# Patient Record
Sex: Female | Born: 1970 | Race: Black or African American | Hispanic: No | Marital: Single | Smoking: Former smoker
Health system: Southern US, Community
[De-identification: ages and names within clinical notes are randomized; demographics above are authoritative.]

## PROBLEM LIST (undated history)

## (undated) DIAGNOSIS — F419 Anxiety disorder, unspecified: Secondary | ICD-10-CM

## (undated) DIAGNOSIS — F319 Bipolar disorder, unspecified: Secondary | ICD-10-CM

## (undated) DIAGNOSIS — F32A Depression, unspecified: Secondary | ICD-10-CM

## (undated) DIAGNOSIS — E119 Type 2 diabetes mellitus without complications: Secondary | ICD-10-CM

## (undated) DIAGNOSIS — I10 Essential (primary) hypertension: Secondary | ICD-10-CM

## (undated) DIAGNOSIS — F329 Major depressive disorder, single episode, unspecified: Secondary | ICD-10-CM

## (undated) DIAGNOSIS — H409 Unspecified glaucoma: Secondary | ICD-10-CM

## (undated) HISTORY — DX: Essential (primary) hypertension: I10

## (undated) HISTORY — DX: Type 2 diabetes mellitus without complications: E11.9

## (undated) HISTORY — DX: Bipolar disorder, unspecified: F31.9

## (undated) HISTORY — DX: Unspecified glaucoma: H40.9

## (undated) HISTORY — DX: Depression, unspecified: F32.A

## (undated) HISTORY — DX: Major depressive disorder, single episode, unspecified: F32.9

## (undated) HISTORY — PX: OTHER SURGICAL HISTORY: SHX169

## (undated) HISTORY — PX: TUBAL LIGATION: SHX77

## (undated) HISTORY — DX: Anxiety disorder, unspecified: F41.9

---

## 1999-11-15 ENCOUNTER — Encounter: Payer: Self-pay | Admitting: Family Medicine

## 1999-11-15 ENCOUNTER — Emergency Department (HOSPITAL_COMMUNITY): Admission: EM | Admit: 1999-11-15 | Discharge: 1999-11-15 | Payer: Self-pay | Admitting: Emergency Medicine

## 2003-07-22 ENCOUNTER — Encounter: Admission: RE | Admit: 2003-07-22 | Discharge: 2003-07-22 | Payer: Self-pay | Admitting: Family Medicine

## 2003-07-22 ENCOUNTER — Other Ambulatory Visit: Admission: RE | Admit: 2003-07-22 | Discharge: 2003-07-22 | Payer: Self-pay | Admitting: Family Medicine

## 2003-07-24 ENCOUNTER — Ambulatory Visit (HOSPITAL_COMMUNITY): Admission: RE | Admit: 2003-07-24 | Discharge: 2003-07-24 | Payer: Self-pay | Admitting: Family Medicine

## 2003-08-15 ENCOUNTER — Encounter: Admission: RE | Admit: 2003-08-15 | Discharge: 2003-08-15 | Payer: Self-pay | Admitting: Family Medicine

## 2003-09-30 ENCOUNTER — Encounter: Admission: RE | Admit: 2003-09-30 | Discharge: 2003-09-30 | Payer: Self-pay | Admitting: Family Medicine

## 2003-10-21 ENCOUNTER — Ambulatory Visit (HOSPITAL_COMMUNITY): Admission: RE | Admit: 2003-10-21 | Discharge: 2003-10-21 | Payer: Self-pay | Admitting: Family Medicine

## 2003-10-22 ENCOUNTER — Encounter: Admission: RE | Admit: 2003-10-22 | Discharge: 2003-10-22 | Payer: Self-pay | Admitting: Family Medicine

## 2003-11-21 ENCOUNTER — Encounter: Admission: RE | Admit: 2003-11-21 | Discharge: 2003-11-21 | Payer: Self-pay | Admitting: Sports Medicine

## 2003-12-02 ENCOUNTER — Encounter: Admission: RE | Admit: 2003-12-02 | Discharge: 2003-12-02 | Payer: Self-pay | Admitting: Family Medicine

## 2003-12-25 ENCOUNTER — Ambulatory Visit: Payer: Self-pay | Admitting: Family Medicine

## 2004-01-10 ENCOUNTER — Ambulatory Visit: Payer: Self-pay | Admitting: Family Medicine

## 2004-02-06 ENCOUNTER — Ambulatory Visit: Payer: Self-pay | Admitting: Family Medicine

## 2004-02-19 ENCOUNTER — Ambulatory Visit: Payer: Self-pay | Admitting: Family Medicine

## 2004-02-25 ENCOUNTER — Ambulatory Visit: Payer: Self-pay

## 2004-03-03 ENCOUNTER — Ambulatory Visit: Payer: Self-pay | Admitting: Family Medicine

## 2004-03-03 ENCOUNTER — Inpatient Hospital Stay (HOSPITAL_COMMUNITY): Admission: AD | Admit: 2004-03-03 | Discharge: 2004-03-03 | Payer: Self-pay | Admitting: *Deleted

## 2004-03-06 ENCOUNTER — Inpatient Hospital Stay (HOSPITAL_COMMUNITY): Admission: AD | Admit: 2004-03-06 | Discharge: 2004-03-06 | Payer: Self-pay | Admitting: *Deleted

## 2004-03-08 ENCOUNTER — Ambulatory Visit: Payer: Self-pay | Admitting: *Deleted

## 2004-03-08 ENCOUNTER — Inpatient Hospital Stay (HOSPITAL_COMMUNITY): Admission: AD | Admit: 2004-03-08 | Discharge: 2004-03-12 | Payer: Self-pay | Admitting: Family Medicine

## 2004-03-11 ENCOUNTER — Ambulatory Visit: Payer: Self-pay | Admitting: Obstetrics and Gynecology

## 2004-03-11 ENCOUNTER — Encounter (INDEPENDENT_AMBULATORY_CARE_PROVIDER_SITE_OTHER): Payer: Self-pay | Admitting: Specialist

## 2004-04-03 ENCOUNTER — Ambulatory Visit: Payer: Self-pay | Admitting: Sports Medicine

## 2004-05-12 ENCOUNTER — Ambulatory Visit: Payer: Self-pay | Admitting: Family Medicine

## 2004-10-10 ENCOUNTER — Encounter (INDEPENDENT_AMBULATORY_CARE_PROVIDER_SITE_OTHER): Payer: Self-pay | Admitting: *Deleted

## 2004-10-10 LAB — CONVERTED CEMR LAB

## 2004-10-15 ENCOUNTER — Encounter (INDEPENDENT_AMBULATORY_CARE_PROVIDER_SITE_OTHER): Payer: Self-pay | Admitting: *Deleted

## 2004-10-15 ENCOUNTER — Ambulatory Visit: Payer: Self-pay | Admitting: Family Medicine

## 2004-10-29 ENCOUNTER — Ambulatory Visit: Payer: Self-pay | Admitting: Family Medicine

## 2005-07-31 ENCOUNTER — Ambulatory Visit (HOSPITAL_COMMUNITY): Admission: RE | Admit: 2005-07-31 | Discharge: 2005-07-31 | Payer: Self-pay | Admitting: Chiropractic Medicine

## 2005-08-27 ENCOUNTER — Encounter (INDEPENDENT_AMBULATORY_CARE_PROVIDER_SITE_OTHER): Payer: Self-pay | Admitting: Specialist

## 2005-08-27 ENCOUNTER — Ambulatory Visit: Payer: Self-pay | Admitting: Family Medicine

## 2005-10-26 ENCOUNTER — Ambulatory Visit: Payer: Self-pay | Admitting: Sports Medicine

## 2006-06-09 DIAGNOSIS — F17200 Nicotine dependence, unspecified, uncomplicated: Secondary | ICD-10-CM | POA: Insufficient documentation

## 2006-06-09 DIAGNOSIS — E669 Obesity, unspecified: Secondary | ICD-10-CM | POA: Insufficient documentation

## 2006-06-09 DIAGNOSIS — M1990 Unspecified osteoarthritis, unspecified site: Secondary | ICD-10-CM

## 2006-06-10 ENCOUNTER — Encounter (INDEPENDENT_AMBULATORY_CARE_PROVIDER_SITE_OTHER): Payer: Self-pay | Admitting: *Deleted

## 2007-04-04 ENCOUNTER — Ambulatory Visit: Payer: Self-pay | Admitting: Family Medicine

## 2007-04-04 ENCOUNTER — Encounter: Payer: Self-pay | Admitting: Family Medicine

## 2007-04-04 DIAGNOSIS — I10 Essential (primary) hypertension: Secondary | ICD-10-CM | POA: Insufficient documentation

## 2007-04-04 DIAGNOSIS — N898 Other specified noninflammatory disorders of vagina: Secondary | ICD-10-CM | POA: Insufficient documentation

## 2007-04-04 LAB — CONVERTED CEMR LAB: GC Probe Amp, Genital: NEGATIVE

## 2007-04-18 ENCOUNTER — Encounter (INDEPENDENT_AMBULATORY_CARE_PROVIDER_SITE_OTHER): Payer: Self-pay | Admitting: *Deleted

## 2007-05-23 ENCOUNTER — Telehealth: Payer: Self-pay | Admitting: Family Medicine

## 2007-08-01 ENCOUNTER — Emergency Department (HOSPITAL_COMMUNITY): Admission: EM | Admit: 2007-08-01 | Discharge: 2007-08-01 | Payer: Self-pay | Admitting: Family Medicine

## 2009-01-17 ENCOUNTER — Emergency Department (HOSPITAL_COMMUNITY): Admission: EM | Admit: 2009-01-17 | Discharge: 2009-01-17 | Payer: Self-pay | Admitting: Family Medicine

## 2009-03-24 ENCOUNTER — Emergency Department (HOSPITAL_COMMUNITY): Admission: EM | Admit: 2009-03-24 | Discharge: 2009-03-24 | Payer: Self-pay | Admitting: Family Medicine

## 2009-07-03 ENCOUNTER — Ambulatory Visit: Payer: Self-pay | Admitting: Family Medicine

## 2009-07-03 ENCOUNTER — Telehealth: Payer: Self-pay | Admitting: Family Medicine

## 2009-07-03 DIAGNOSIS — F411 Generalized anxiety disorder: Secondary | ICD-10-CM | POA: Insufficient documentation

## 2009-07-04 ENCOUNTER — Telehealth: Payer: Self-pay | Admitting: Family Medicine

## 2009-09-23 ENCOUNTER — Ambulatory Visit: Payer: Self-pay | Admitting: Family Medicine

## 2009-09-23 ENCOUNTER — Encounter: Payer: Self-pay | Admitting: Family Medicine

## 2009-09-25 ENCOUNTER — Encounter: Payer: Self-pay | Admitting: *Deleted

## 2009-11-25 ENCOUNTER — Telehealth (INDEPENDENT_AMBULATORY_CARE_PROVIDER_SITE_OTHER): Payer: Self-pay | Admitting: *Deleted

## 2009-11-25 ENCOUNTER — Ambulatory Visit: Payer: Self-pay | Admitting: Family Medicine

## 2009-12-26 ENCOUNTER — Encounter: Payer: Self-pay | Admitting: *Deleted

## 2010-04-16 ENCOUNTER — Telehealth: Payer: Self-pay | Admitting: Family Medicine

## 2010-05-12 NOTE — Progress Notes (Signed)
Summary: Ref Req  Phone Note Call from Patient Call back at [phone]   Caller: Patient Summary of Call: Was to be referred to an Ear,Nose & Throat also her daughter Joeseph Amor 03/10/04 was to be schedule for an appointment.  Would like to have appointments at same time if possible. Initial call taken by: Clydell Hakim,  July 04, 2009 9:20 AM  Follow-up for Phone Call        it is noted that Cleon Dew has a referral in but I do not see referral for Samiksha. will forward to MD to please advise. Follow-up by: Theresia Lo RN,  July 04, 2009 12:02 PM  Additional Follow-up for Phone Call Additional follow up Details #1::        Referred daughter to ENT for enlarged tonsils. Advised patient that I would have to see her at her physical to refer her for any issues. Patient agreeable with this. Will call to schedule appointment for two weeks from today  Additional Follow-up by: Bobby Rumpf  MD,  July 07, 2009 2:28 PM

## 2010-05-12 NOTE — Letter (Signed)
Summary: Probation Letter  [facility]  [address]   [place], [place] [address]   Phone: [phone]  Fax: [phone]    09/25/2009  Roselyn Meier [address] [place], [place]  [address]  Dear Ms. [name],  With the goal of better serving all our patients the [facility] is following each patient's missed appointments.  You have missed at least 3 appointments with our practice.If you cannot keep your appointment, we expect you to call at least 24 hours before your appointment time.  Missing appointments prevents other patients from seeing [place] and makes it difficult to provide you with the best possible medical care.      1.   If you miss one more appointment, we will only give you limited medical services. This means we will not call in medication refills, complete a form, or make a referral for you except when you are here for a scheduled office visit.    2.   If you miss 2 or more appointments in the next year, we will dismiss you from our practice.    Our office staff can be reached at [phone] Monday through Friday from 8:30 a.m.-5:00 p.m. and will be glad to schedule your appointment as necessary.    Thank you.   [facility]

## 2010-05-12 NOTE — Progress Notes (Signed)
Summary: Rx Prob  Phone Note Call from Patient Call back at [phone]   Caller: Patient Summary of Call: Pt has been getting headaches, shortness of breath from the bp meds she is taking along with being dizzy.  Has helped some with the swelling.  She stopped taking them yesterday. Initial call taken by: Clydell Hakim,  November 25, 2009 12:03 PM  Follow-up for Phone Call        spoke with patient and advised that we can't assume that the symptoms she is experiencing are coming from the medciation HCTZ. BP  was very elevated at last office and she should not stop taking medciation without doctor advising about this. suggested she come in today due to symptoms she has experienced and let MD decide what is best for her. she agrees and appointment is scheduled for work in. Follow-up by: Theresia Lo RN,  November 25, 2009 12:37 PM

## 2010-05-12 NOTE — Assessment & Plan Note (Signed)
Summary: worsening anxiety/[place]/Aaric Dolph   Vital Signs:  Patient profile:   40 year old female Weight:      192.898.1 pounds Pulse rate:   81 / minute BP sitting:   165 / 118  (right arm)  Vitals Entered By: Starleen Blue RN (September 23, 2009 2:59 PM) CC: walkin anxiety Is Patient Diabetic? No Pain Assessment Patient in pain? no      Repeat BP at 3:37,  156/110  Starleen Blue RN  September 23, 2009 3:38 PM   Primary Care Provider:  Bobby Rumpf  MD  CC:  walkin anxiety.  History of Present Illness: 1) Anxiety: Here today reporting that her anxiety has worsened. Last seen on 07/03/09 for anxiety/depressive symptoms x several months; was supposed to follow up in two weeks, however did not keep appointment twice. Was given prescription for Sertraline which she did not fill, refill of Xanax which she did fill (but was only given enough as a bridge, thus she ran out). Has had continued daily and tearfulness - had to leave work due to depressive symptoms since 08/10/09. 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 Reports continued stressors - single parent (kids are 26 and 5), separated from husband in October 2010, work, night classes at [employer] Continued decreased appetite, anhedonia (used to enjoy shoppping), tearfulness, poor sleep (she wakes up almost every night - prays when she cannot sleep), low motivation. Denies SI/HI, but wants "all the stress to just go away". Reports maternal history of depression and "something else" of which she is not sure. Denies manic symptoms. Has been on Paxil in the past which made her feel "slowed down".   - Needs letter stating that she has been out of work due to depressive symptoms.   2) HTN: Has been on HCTZ in the past but stopped taking - she is unsure why. BP elevated today both initially and on recheck. Denies headache, chest pain, vision change. Reports some LE edema which is worse after long day. Does not follow DASH diet.  Habits &  Providers  Alcohol-Tobacco-Diet     Tobacco Status: current     Cigarette Packs/Day: <0.25  Current Medications (verified): 1)  Sertraline Hcl 50 Mg Tabs (Sertraline Hcl) .... One Tab By Mouth At Bedtime 2)  Alprazolam 0.5 Mg Tabs (Alprazolam) .... One Tab By Mouth Three Times A Day As Needed Anxiety 3)  Hydrochlorothiazide 25 Mg Tabs (Hydrochlorothiazide) .... One Tab By Mouth Qday  Allergies (verified): No Known Drug Allergies  Social History: Packs/Day:  <0.25  Review of Systems       as per HPI o/w negative   Physical Exam  General:  overweight, NAD, very tearful and anxious appearing, vitals reviewed - hypertensive  Lungs:  Normal respiratory effort, chest expands symmetrically. Lungs are clear to auscultation, no crackles or wheezes. Heart:  Normal rate and regular rhythm. S1 and S2 normal without gallop, murmur, click, rub or other extra sounds. Extremities:  trace edema  Neurologic:  alert & oriented X3.   Psych:  Oriented X3, memory intact for recent and remote, normally interactive, good eye contact, not suicidal, not homicidal; tearful, and moderately anxious.     Impression & Recommendations:  Problem # 1:  ANXIETY DISORDER (ICD-300.00) Assessment Deteriorated  Will have patient start Sertraline - will monitor closely given described symptoms of psychomotor depression on Paxil. Will refill Xanax as a bridge to therapeutic effect of sertraline. I had given patient referral information  for counselling as per  recommendation of Dr. Pascal Lux (psychologist) but she did not follow up. I will write the letter stating that she has been out of work due to her ongoing psychiatric issues, but advised patient that she needs to follow up as directed. FOLLOW UP IN TWO WEEKS. Call at one week mark to ensure patient doing well.   Her updated medication list for this problem includes:    Sertraline Hcl 50 Mg Tabs (Sertraline hcl) ..... One tab by mouth at bedtime    Alprazolam 0.5 Mg  Tabs (Alprazolam) ..... One tab by mouth three times a day as needed anxiety  Orders: [facility]- Est  Level 4 ([address])  Problem # 2:  HYPERTENSION (ICD-401.9) Assessment: Deteriorated  Will check BMET in two weeks. Restart HCTZ. DASH diet and exercise reviewed - will revisit at next appointment.  Her updated medication list for this problem includes:    Hydrochlorothiazide 25 Mg Tabs (Hydrochlorothiazide) ..... One tab by mouth qday  BP today: 165/118 Prior BP: 150/96 (07/03/2009)  Orders: [facility]- Est  Level 4 ([address])  Complete Medication List: 1)  Sertraline Hcl 50 Mg Tabs (Sertraline hcl) .... One tab by mouth at bedtime 2)  Alprazolam 0.5 Mg Tabs (Alprazolam) .... One tab by mouth three times a day as needed anxiety 3)  Hydrochlorothiazide 25 Mg Tabs (Hydrochlorothiazide) .... One tab by mouth qday  Patient Instructions: 1)  Follow up in two weeks. 2)  Start taking Zoloft as instructed. If you have ANY side effects let me know by calling. I will call you as well to see how you are doing.  3)  Take Xanax as instructed when you are feeling anxious.  4)  Start taking hydrochlorothiazide for blood pressure. 5)  Keep your sodium (salt) intake below 2000 mg daily  Prescriptions: HYDROCHLOROTHIAZIDE 25 MG TABS (HYDROCHLOROTHIAZIDE) one tab by mouth qday  #30 x 1   Entered and Authorized by:   Bobby Rumpf  MD   Signed by:   Bobby Rumpf  MD on 09/23/2009   Method used:   Electronically to        [facility] [phone]* (retail)       [address]       [place], [place]  [address]       Ph: [phone]       Fax: [phone]   RxID:   [id number] SERTRALINE HCL 50 MG TABS (SERTRALINE HCL) one tab by mouth at bedtime  #30 x 0   Entered and Authorized by:   Bobby Rumpf  MD   Signed by:   Bobby Rumpf  MD on 09/23/2009   Method used:   Electronically to        [facility] [phone]* (retail)       [address]       [place], [place]  [address]       Ph: [phone]        Fax: [phone]   RxID:   [id number] ALPRAZOLAM 0.5 MG TABS (ALPRAZOLAM) one tab by mouth three times a day as needed anxiety  #30 x 0   Entered and Authorized by:   Bobby Rumpf  MD   Signed by:   Bobby Rumpf  MD on 09/23/2009   Method used:   Print then Give to Patient   RxID:   [id number]    Prevention & Chronic Care Immunizations   Influenza vaccine: Not documented    Tetanus booster: Not documented    Pneumococcal vaccine: Not documented  Other Screening   Pap smear: Done.  (10/10/2004)    Mammogram: Not documented   Smoking status: current  (09/23/2009)  Lipids   Total Cholesterol: Not documented   LDL: Not documented   LDL Direct: Not documented   HDL: Not documented   Triglycerides: Not documented  Hypertension   Last Blood Pressure: 165 / 118  (09/23/2009)   Serum creatinine: Not documented   Serum potassium Not documented   Basic metabolic panel due: 10/08/2009    Hypertension flowsheet reviewed?: Yes   Progress toward BP goal: Deteriorated  Self-Management Support :   Personal Goals (by the next clinic visit) :      Personal blood pressure goal: 140/90  (09/23/2009)   Patient will work on the following items until the next clinic visit to reach self-care goals:     Medications and monitoring: take my medicines every day, check my blood pressure, bring all of my medications to every visit  (09/23/2009)     Eating: drink diet soda or water instead of juice or soda, eat more vegetables, use fresh or frozen vegetables, eat foods that are low in salt, eat baked foods instead of fried foods, eat fruit for snacks and desserts, limit or avoid alcohol  (09/23/2009)    Hypertension self-management support: Written self-care plan  (09/23/2009)   Hypertension self-care plan printed.

## 2010-05-12 NOTE — Letter (Signed)
Summary: Generic Letter  [facility]  [address]   [place], [place] [address]   Phone: [phone]  Fax: [phone]    09/23/2009  Teresa Chang [address] [place], [place]  [address]  To whom it may concern,  This letter is to certify that the above-mentioned patient has been out of work from the time period of 08/10/09 to 09/19/09 as a result of medical issues. Please feel free to direct any questions regarding this matter to my office at the above address and/or phone number.    Sincerely,   Bobby Rumpf  MD

## 2010-05-12 NOTE — Assessment & Plan Note (Signed)
Summary: headache, dizziness, shortness of breath episodes that she at...   Vital Signs:  Patient profile:   40 year old female Weight:      188.3 pounds Temp:     99.0 degrees F oral Pulse rate:   118 / minute BP sitting:   136 / 91  (left arm) Cuff size:   regular  Vitals Entered By: Garen Grams LPN (November 25, 2009 3:37 PM) CC: problem with BP meds Is Patient Diabetic? No Pain Assessment Patient in pain? no        Primary Care Provider:  Bobby Rumpf  MD  CC:  problem with BP meds.  History of Present Illness: 1. HTN - Pt has been taking HCTZ as prescribed since June - For the past couple of weeks she has been dizzy, light headed, and gets short of breath easily - She is also very thirsty - Her LE swelling has improved - She doesn't check her blood pressure at home regularly  ROS: denies chest pain, headache, vision changes  2. Tobacco use - Has cut down to 5-6 cigs per day - She wants to quit - Was going to try Chantix but was afraid of the side effects - Is going to try and quit on her own  Habits & Providers  Alcohol-Tobacco-Diet     Tobacco Status: current     Tobacco Counseling: to quit use of tobacco products     Cigarette Packs/Day: <0.25  Current Medications (verified): 1)  Lisinopril-Hydrochlorothiazide 10-12.5 Mg Tabs (Lisinopril-Hydrochlorothiazide) .Marland Kitchen.. 1 Tab By Mouth Daily For Blood Pressure  Allergies: No Known Drug Allergies  Past History:  Past Medical History: G2P2- preecclampsia 11/06 HTN Anxiety  Social History: Reviewed history from 04/04/2007 and no changes required. 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  Lives with 85 year old daughter and husband in [place].  Physical Exam  General:  vitals reviewed.  Tachycardic.  no acute distress Head:  normocephalic and atraumatic.   Eyes:  vision grossly intact.  PERRL, EOMI Mouth:  tachy  MM Neck:  no JVD Lungs:  normal respiratory effort, no crackles, and no wheezes.     Heart:  tachycardic, regular rhythm, and no murmur.   Abdomen:  soft, non-tender, and normal bowel sounds.   Extremities:  no LEE Neurologic:  alert & oriented X3, cranial nerves II-XII intact, strength normal in all extremities, sensation intact to light touch, and gait normal.   Skin:  turgor normal.   Psych:  not anxious appearing and not depressed appearing.     Impression & Recommendations:  Problem # 1:  HYPERTENSION (ICD-401.9) Assessment Unchanged  BP is improved but she is likely getting dehydrated from the HCTZ.  She likes that her leg swelling has improved so would like to keep taking a diuretic.  Will decrease the HCTZ and add on ACEI.  Will have her follow up in 2 weeks.  Will need BMET at next office visit. The following medications were removed from the medication list:    Hydrochlorothiazide 25 Mg Tabs (Hydrochlorothiazide) ..... One tab by mouth qday Her updated medication list for this problem includes:    Lisinopril-hydrochlorothiazide 10-12.5 Mg Tabs (Lisinopril-hydrochlorothiazide) .Marland Kitchen... 1 tab by mouth daily for blood pressure  Orders: [facility]- Est  Level 4 ([address])  Problem # 2:  TOBACCO DEPENDENCE (ICD-305.1) Assessment: Unchanged  Advised to quit smoking  Orders: [facility]- Est  Level 4 ([address])  Complete Medication List: 1)  Lisinopril-hydrochlorothiazide 10-12.5 Mg Tabs (Lisinopril-hydrochlorothiazide) .Marland Kitchen.. 1 tab by mouth  daily for blood pressure Prescriptions: LISINOPRIL-HYDROCHLOROTHIAZIDE 10-12.5 MG TABS (LISINOPRIL-HYDROCHLOROTHIAZIDE) 1 tab by mouth daily for blood pressure  #30 x 3   Entered and Authorized by:   Angelena Sole MD   Signed by:   Angelena Sole MD on 11/25/2009   Method used:   Electronically to        [facility] [phone]* (retail)       [address]       [place], [place]  [address]       Ph: [phone]       Fax: [phone]   RxID:   [id number]

## 2010-05-12 NOTE — Miscellaneous (Signed)
Summary: walk in  Clinical Lists Changes she came in & filled out a walk in form while we were at lunch. she did not come back so I called her [phone]. states she is turning the corner onto [address] now. she left work because of her emotional issues and needs a letter? put with Dr. Wallene Huh as he is familiar with her & has had a no show on his schedule.Golden Circle RN  September 23, 2009 2:45 PM   Saw patient in clinic. See note. Bobby Rumpf  MD  September 23, 2009 4:17 PM

## 2010-05-12 NOTE — Progress Notes (Signed)
Summary: Rx Prob  Phone Note Call from Patient Call back at [phone]   Caller: Patient Summary of Call: Patient was to get rx for Zoloft today sent [address] [facility] [address]  Has not been sent yet. Initial call taken by: Clydell Hakim,  July 03, 2009 3:56 PM  Follow-up for Phone Call        advised patient that message will be sent to MD to please send in Zoloft. Follow-up by: Theresia Lo RN,  July 03, 2009 4:40 PM    Prescriptions: SERTRALINE HCL 50 MG TABS (SERTRALINE HCL) one tab by mouth at bedtime  #30 x 0   Entered and Authorized by:   Bobby Rumpf  MD   Signed by:   Bobby Rumpf  MD on 07/04/2009   Method used:   Electronically to        [facility] [phone]* (retail)       [address]       [place], [place]  [address]       Ph: [phone]       Fax: [phone]   RxID:   [id number]

## 2010-05-12 NOTE — Letter (Signed)
Summary: Suspension Letter  [facility]  [address]   [place], [place] [address]   Phone: [phone]  Fax: [phone]    12/26/2009  Teresa Chang [address] [place], [place]  [address]  Dear Ms. [name],  You have missed 4 scheduled appointments with our practice.If you cannot keep your appointment, we expect you to call and cancel at least 24 hours before your appointment time.  As per our policy, we will now only give you limited medical services. means we will not call in a refill for you, or complete a form or make a referral except when you are here for a scheduled office visit.   If you miss 2 more appointments in the next year, we will dismiss you from our practice.  We hope this does not happen.  If you keep your appointments for the next year you will be returned to regular patient status.  We hope these changes will encourage you to keep your appointments so we may provide you the best medical care.   Our office staff can be reached at [phone] Monday through Friday from 8:30 a.m.-5:00 p.m. and will be glad to schedule your appointment as necessary.     Sincerely,   The [facility]

## 2010-05-12 NOTE — Assessment & Plan Note (Signed)
Summary: anxiety ,df   Vital Signs:  Patient profile:   40 year old female Weight:      195.8 pounds Temp:     98.1 degrees F oral Pulse rate:   79 / minute BP sitting:   150 / 96  (left arm) Cuff size:   regular  Vitals Entered By: Garen Grams LPN (July 03, 2009 9:33 AM) CC: anxiety  Is Patient Diabetic? No Pain Assessment Patient in pain? no        Primary Care Provider:  Bobby Rumpf  MD  CC:  anxiety .  History of Present Illness: 1) Anxiety: Reports of worsening anxiety x several months. Has been to [facility] twice within the past several months because of symptoms of severe anxiety and tearfulness - given prescription for Paxil which made her feel "very tired and slowed down" and Xanax 0.5 mg three times a day as needed which helped with her symptoms, but she ran out of this about 3 months ago. 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 Reports decreased appetite, anhedonia (used to enjoy shoppping), tearfulness, poor sleep (she wakes up almost every night - prays when she cannot sleep), low motivation. Denies SI/HI. Reports maternal history of depression and "something else" of which she is not sure. Denies manic symptoms. Wants to just have time to herself to help get rid of stressors.   Habits & Providers  Alcohol-Tobacco-Diet     Tobacco Status: current     Cigarette Packs/Day: 0.25  Current Medications (verified): 1)  Sertraline Hcl 50 Mg Tabs (Sertraline Hcl) .... One Tab By Mouth At Bedtime 2)  Alprazolam 0.5 Mg Tabs (Alprazolam) .... One Tab By Mouth Three Times A Day As Needed Anxiety  Allergies (verified): No Known Drug Allergies  Social History: Packs/Day:  0.25  Physical Exam  General:  overweight, NAD, able to talk about stressors without getting tearful  Lungs:  Normal respiratory  effort, chest expands symmetrically. Lungs are clear to auscultation, no crackles or wheezes. Heart:  Normal rate and regular rhythm. S1 and S2 normal without gallop, murmur, click, rub or other extra sounds. Psych:  Oriented X3, memory intact for recent and remote, normally interactive, good eye contact, not anxious appearing, not depressed appearing, not agitated, not suicidal, and not homicidal.     Impression & Recommendations:  Problem # 1:  ANXIETY DISORDER (ICD-300.00) Assessment New  Will start Sertraline - will monitor closely given described symptoms of psychomotor depression on Paxil. Will refill Xanax as a bridge to therapeutic effect of sertraline. Will refer for counselling as per recommendation of Dr. Pascal Lux (psychologist). Follow up in two weeks for medication effect and for CPE / Pap at that time as well.  Her updated medication list for this problem includes:    Sertraline Hcl 50 Mg Tabs (Sertraline hcl) ..... One tab by mouth at bedtime    Alprazolam 0.5 Mg Tabs (Alprazolam) ..... One tab by mouth three times a day as needed anxiety  Orders: [facility]- Est Level  3 ([address])  Complete Medication List: 1)  Sertraline Hcl 50 Mg Tabs (Sertraline hcl) .... One tab by mouth at bedtime 2)  Alprazolam 0.5 Mg Tabs (Alprazolam) .... One tab by mouth three times a day as needed anxiety  Patient Instructions: 1)  Follow up in two weeks. 2)  We will do  your physical then. 3)  Start taking Zoloft as instructed. If you have ANY side effects let me know by calling. I will call you as well to see how you are doing.  4)  Call the number provided to get set up with counselling.  Prescriptions: ALPRAZOLAM 0.5 MG TABS (ALPRAZOLAM) one tab by mouth three times a day as needed anxiety  #30 x 0   Entered and Authorized by:   Bobby Rumpf  MD   Signed by:   Bobby Rumpf  MD on 07/03/2009   Method used:   Print then Give to Patient   RxID:   [id number]

## 2010-05-14 NOTE — Progress Notes (Signed)
Summary: Rx  Phone Note Refill Request Call back at [phone]   Refills Requested: Medication #1:  LISINOPRIL-HYDROCHLOROTHIAZIDE 10-12.5 MG TABS 1 tab by mouth daily for blood pressure. [facility]  Initial call taken by: Knox Royalty,  April 16, 2010 10:58 AM    Prescriptions: LISINOPRIL-HYDROCHLOROTHIAZIDE 10-12.5 MG TABS (LISINOPRIL-HYDROCHLOROTHIAZIDE) 1 tab by mouth daily for blood pressure  #30 x 6   Entered and Authorized by:   Bobby Rumpf  MD   Signed by:   Bobby Rumpf  MD on 04/16/2010   Method used:   Electronically to        [facility] [phone]* (retail)       [address]       [place], [place]  [address]       Ph: [phone]       Fax: [phone]   RxID:   [id number]

## 2010-08-28 NOTE — Op Note (Signed)
Teresa Chang, POCIUS NO.:  [id number]   MEDICAL RECORD NO.:  [id number]          PATIENT TYPE:  INP   LOCATION:  9116                          FACILITY:  [facility]   PHYSICIAN:  Phil D. Okey Dupre, M.D.     DATE OF BIRTH:  09-13-1970   DATE OF PROCEDURE:  03/11/2004  DATE OF DISCHARGE:                                 OPERATIVE REPORT   PREOPERATIVE DIAGNOSIS:  Voluntary sterilization.   POSTOPERATIVE DIAGNOSIS:  Voluntary sterilization.   OPERATION PERFORMED:  Bilateral tubal ligation and partial bilateral  salpingectomy.   SURGEON:  Javier Glazier. Okey Dupre, M.D.   ANESTHESIA:  General.   POSTOPERATIVE CONDITION:  Satisfactory.   ESTIMATED BLOOD LOSS:  20 mL.   DESCRIPTION OF PROCEDURE:  Under satisfactory general anesthesia patient in  dorsal supine position, the abdomen was prepped and draped in the usual  sterile manner and entered through a semilunar incision situated just below  the umbilicus and extended for a total length of 4 cm.  The abdomen was  entered by layers.  On entering the peritoneal cavity, each fallopian tube  was identified, grasped in the midportion with a Babcock clamp and opening  made in an avascular portion of the broad ligament with a hemostat through  which was drawn a #1 plain catgut suture which was tied around the distal  and proximal ends of the tube to form a loop above the tie of approximately  2 cm in length.  This tie was held with a hemostat while a second tie was  placed just below the aforementioned tie and cut short.  A portion of tube  above the ties was then excised and the free ends of the tube thus exposed  and coagulated with hot cautery.  The area was observed for bleeding and  none was noted and the tubes were replaced into the peritoneal cavity.  A  small bleeder on portion of the peritoneum was probably hit on entry, was  tied off with a 3-0 Vicryl suture.  Area observed for bleeding, none was  noted and the peritoneum was  closed with a 0 Vicryl suture and a pursestring  which was continued on to a fascial closure and a subcuticular closure.  Dry  sterile dressing was applied.  The patient tolerated the procedure well and  was transferred to recovery room in satisfactory condition.      PDR/MEDQ  D:  03/11/2004  T:  03/11/2004  Job:  [id number]

## 2010-12-10 ENCOUNTER — Other Ambulatory Visit: Payer: Self-pay | Admitting: Family Medicine

## 2010-12-10 MED ORDER — LISINOPRIL-HYDROCHLOROTHIAZIDE 10-12.5 MG PO TABS: 1.0000 | ORAL_TABLET | Freq: Every day | ORAL | Status: DC

## 2011-02-04 ENCOUNTER — Telehealth: Payer: Self-pay | Admitting: *Deleted

## 2011-02-04 DIAGNOSIS — I10 Essential (primary) hypertension: Secondary | ICD-10-CM

## 2011-02-04 MED ORDER — LISINOPRIL-HYDROCHLOROTHIAZIDE 10-12.5 MG PO TABS: 1.0000 | ORAL_TABLET | Freq: Every day | ORAL | Status: DC

## 2011-02-04 NOTE — Telephone Encounter (Signed)
Pt advised to make appt and we would give enough refills to last till then.  Appt made for February 19, 2011, 3 weeks worth of meds given.  WILL NOT refill if appt is not kept Alyan Hartline, Maryjo Rochester

## 2011-02-19 ENCOUNTER — Encounter: Payer: Self-pay | Admitting: Family Medicine

## 2011-02-19 ENCOUNTER — Ambulatory Visit (INDEPENDENT_AMBULATORY_CARE_PROVIDER_SITE_OTHER): Payer: BC Managed Care – PPO | Admitting: Family Medicine

## 2011-02-19 DIAGNOSIS — R5383 Other fatigue: Secondary | ICD-10-CM

## 2011-02-19 DIAGNOSIS — Z79899 Other long term (current) drug therapy: Secondary | ICD-10-CM

## 2011-02-19 DIAGNOSIS — R0602 Shortness of breath: Secondary | ICD-10-CM

## 2011-02-19 DIAGNOSIS — R0609 Other forms of dyspnea: Secondary | ICD-10-CM | POA: Insufficient documentation

## 2011-02-19 DIAGNOSIS — M818 Other osteoporosis without current pathological fracture: Secondary | ICD-10-CM

## 2011-02-19 DIAGNOSIS — F17200 Nicotine dependence, unspecified, uncomplicated: Secondary | ICD-10-CM

## 2011-02-19 DIAGNOSIS — I10 Essential (primary) hypertension: Secondary | ICD-10-CM

## 2011-02-19 LAB — BASIC METABOLIC PANEL
BUN: 11 mg/dL (ref 6–23)
CO2: 27 mEq/L (ref 19–32)
Calcium: 9.4 mg/dL (ref 8.4–10.5)
Chloride: 105 mEq/L (ref 96–112)
Creat: 0.93 mg/dL (ref 0.50–1.10)
Glucose, Bld: 89 mg/dL (ref 70–99)

## 2011-02-19 LAB — CBC
Hemoglobin: 12.3 g/dL (ref 12.0–15.0)
Platelets: 227 10*3/uL (ref 150–400)
RBC: 4.71 MIL/uL (ref 3.87–5.11)
WBC: 6.4 10*3/uL (ref 4.0–10.5)

## 2011-02-19 LAB — VITAMIN B12: Vitamin B-12: 416 pg/mL (ref 211–911)

## 2011-02-19 MED ORDER — LISINOPRIL-HYDROCHLOROTHIAZIDE 10-12.5 MG PO TABS: 1.0000 | ORAL_TABLET | Freq: Every day | ORAL | Status: DC

## 2011-02-19 MED ORDER — NICOTINE 7 MG/24HR TD PT24: 1.0000 | MEDICATED_PATCH | TRANSDERMAL | Status: AC

## 2011-02-19 NOTE — Assessment & Plan Note (Signed)
Discussed nicotine addiction and relapses with quitting.  Encouraged her, advised not to become too angry with herself as this is not productive.  Rx for 7mg  patches.

## 2011-02-19 NOTE — Assessment & Plan Note (Signed)
Concerned for several causes of dyspnea and fatigue, including anemia, heart failure.  However, it may be related to patient's smoking and her lung function is worsening.  She declines referral to Rx clinic for spirometry today.  Will check CBC, BNP.

## 2011-02-19 NOTE — Assessment & Plan Note (Signed)
Unclear cause- will rule out organic etiology, ordered CBC, Vit D and B 12.

## 2011-02-19 NOTE — Assessment & Plan Note (Addendum)
Well controlled on current medication.  Pt seems to think medication is causing side effects, but these are not common side effects of her medicine.  Will continue current medications for now, check BMET.

## 2011-02-19 NOTE — Patient Instructions (Addendum)
It was nice to meet you.  Your blood pressure today was BP: 119/82 mmHg.  Remember your goal blood pressure is about 120/80.  Great Job! Please be sure to take your medication every day.    I am going to check some labs to evaluate your tiredness.    Please keep trying to quit smoking- it is one of the healthiest things you can do for yourself, and I suspect you will feel much better if you quit.  Please make an appointment for your Well Woman Exam and pap smear sometime in the next 6 months.

## 2011-02-19 NOTE — Progress Notes (Signed)
  Subjective:    Patient ID: Teresa Chang, female    DOB: [birth year], 40 y.o.   MRN: [id number]  HPI  [name] comes in for followup. She says that she is taking her blood pressure medicine and medicine days. However she says that some days when she doesn't take it, and then restarts it, she feels like her joints ache. She also says that when she is being asked if, like playing with her daughter, she feels short of breath and tired easily. She feels like this is related to the medicine. Patient also says that the medicine makes her have to go the bathroom frequently, she says she does not mind that but she would like a note for work so that they will become angry with her for using facilities often.  [name] is smoking about 5 cigarettes a day right now. She says that last summer she had quit for a couple weeks, but then she got stressed out about school and ASA she had to write, and started smoking again. She really wants to quit, and is very frustrated that she can't just stop. She has tried Chantix in the past, and says that it made her feel very weird and she does not want to be on a medicine. She says that she smokes Newports.  She says she has looked at nicotine patches, but was not sure which size patch to buy.   Review of Systems Per HPI.     Objective:   Physical Exam BP 119/82  Pulse 71  Temp(Src) 98 F (36.7 C) (Oral)  Ht 5\' 9"  (1.753 m)  Wt 190 lb 1.6 oz (86.229 kg)  BMI 28.07 kg/m2  LMP 02/11/2011 General appearance: alert, cooperative and no distress Eyes: conjunctivae/corneas clear. PERRL, EOM's intact. Fundi benign. Throat: lips, mucosa, and tongue normal; teeth and gums normal Lungs: clear to auscultation bilaterally Heart: regular rate and rhythm, S1, S2 normal, no murmur, click, rub or gallop Abdomen: soft, non-tender; bowel sounds normal; no masses,  no organomegaly Extremities: extremities normal, atraumatic, no cyanosis or edema Pulses: 2+ and  symmetric Neurologic: Grossly normal       Assessment & Plan:

## 2011-02-22 ENCOUNTER — Encounter: Payer: Self-pay | Admitting: Family Medicine

## 2011-02-22 ENCOUNTER — Telehealth: Payer: Self-pay | Admitting: Family Medicine

## 2011-02-22 MED ORDER — VITAMIN D (ERGOCALCIFEROL) 1.25 MG (50000 UNIT) PO CAPS: 50000.0000 [IU] | ORAL_CAPSULE | ORAL | Status: DC

## 2011-02-22 NOTE — Telephone Encounter (Signed)
Called Ms. Lope, let her know her Vit D was low, other labs normal.  Advised Oral replacement therapy, one pill once a week x 3 months.    Also told her I would send a letter so she has a copy of her labs.  She says she forgot to make a copy of th work letter I wrote at her office visit, she would like another copy for her records.

## 2011-09-10 ENCOUNTER — Ambulatory Visit (INDEPENDENT_AMBULATORY_CARE_PROVIDER_SITE_OTHER): Payer: BC Managed Care – PPO | Admitting: Family Medicine

## 2011-09-10 ENCOUNTER — Encounter: Payer: Self-pay | Admitting: Family Medicine

## 2011-09-10 VITALS — BP 123/84 | HR 89 | Temp 98.0°F | Ht 68.0 in | Wt 191.3 lb

## 2011-09-10 DIAGNOSIS — M62830 Muscle spasm of back: Secondary | ICD-10-CM | POA: Insufficient documentation

## 2011-09-10 DIAGNOSIS — M5380 Other specified dorsopathies, site unspecified: Secondary | ICD-10-CM

## 2011-09-10 MED ORDER — DICLOFENAC SODIUM 75 MG PO TBEC: 75.0000 mg | DELAYED_RELEASE_TABLET | Freq: Two times a day (BID) | ORAL | Status: DC

## 2011-09-10 NOTE — Assessment & Plan Note (Signed)
Rest, heat, tennis ball massage. Diclofenac. Referral to Dr. Katrinka Blazing for manipulation. Thoracic area by shoulder blade on the left side.

## 2011-09-10 NOTE — Progress Notes (Signed)
  Subjective:   Patient ID: Teresa Chang, female DOB: October 01, 1970 41 y.o. MRN: [id number] HPI:  1. Upper back paraspinal muscle pain left side.  Synopsis: patient has three days of pain in the above area. No inciting factor. Has an old injury from work years ago to the upper back that may be related. No surgeries in the area. She has no nerve-like pain. She is able to function.  Location: left upper paraspinal muscle next to shoulder blade.  Onset: has been acute  Time period of: 3 day(s).  Severity is described as moderate to severe.  Aggravating: palpation of muscle.  Alleviating: none Associated sx/sn: no fevers, no other joint pains. No rash.   History  Substance Use Topics  . Smoking status: Current Everyday Smoker -- 0.3 packs/day    Types: Cigarettes  . Smokeless tobacco: Not on file  . Alcohol Use: Not on file    Review of Systems: Pertinent items are noted in HPI.  Labs Reviewed: yes Reviewed Chart Review for last notes.     Objective:   Filed Vitals:   09/10/11 1007  BP: 123/84  Pulse: 89  Temp: 98 F (36.7 C)  TempSrc: Oral  Height: 5\' 8"  (1.727 m)  Weight: 191 lb 4.8 oz (86.773 kg)   Physical Exam: General: aaf, nad, pleasant Back: FROM. spurlings normal. Left arm raise causes pain at the left upper paraspinal muscle area.  Spasm of  left upper paraspinal muscle area.  Gait normal.  Assessment & Plan:   Tobacco Abuse:  Advised patient about the risks of smoking to health.

## 2011-09-10 NOTE — Patient Instructions (Signed)
Meds ordered this encounter  Medications  . diclofenac (VOLTAREN) 75 MG EC tablet    Sig: Take 1 tablet (75 mg total) by mouth 2 (two) times daily.    Dispense:  30 tablet    Refill:  0   Muscle spasm.  Tennis ball on ground to massage out muscle. Referral to Dr. Katrinka Blazing for manipulation.

## 2011-09-13 ENCOUNTER — Telehealth: Payer: Self-pay | Admitting: Family Medicine

## 2011-09-13 NOTE — Telephone Encounter (Signed)
Patient can have note for out of work today, but needs to come and be seen as a work in if she needs more treatment.  I do not think she should be out of work till the 14th for a muscle spasm.

## 2011-09-13 NOTE — Telephone Encounter (Signed)
Will send message to Dr. Rivka Safer to advise about a note for work today and other instructions for pain relief. . Patient is scheduled for 06/14 with Dr. Katrinka Blazing.

## 2011-09-13 NOTE — Telephone Encounter (Signed)
Pt is still having a lot of pain and needs to see what she can do to help - needs another note to be out of work until she sees Dr Katrinka Blazing She is supposed to go back to work at 1:00 today

## 2011-09-13 NOTE — Telephone Encounter (Signed)
Patient notified. Note put in file in front office.

## 2011-09-24 ENCOUNTER — Ambulatory Visit: Payer: BC Managed Care – PPO | Admitting: Family Medicine

## 2011-12-17 ENCOUNTER — Encounter: Payer: Self-pay | Admitting: Family Medicine

## 2011-12-17 ENCOUNTER — Ambulatory Visit (INDEPENDENT_AMBULATORY_CARE_PROVIDER_SITE_OTHER): Payer: BC Managed Care – PPO | Admitting: Family Medicine

## 2011-12-17 VITALS — BP 122/81 | HR 80 | Temp 98.8°F | Ht 68.0 in | Wt 188.0 lb

## 2011-12-17 DIAGNOSIS — F329 Major depressive disorder, single episode, unspecified: Secondary | ICD-10-CM

## 2011-12-17 MED ORDER — VENLAFAXINE HCL ER 37.5 MG PO CP24: 37.5000 mg | ORAL_CAPSULE | Freq: Every day | ORAL | Status: DC

## 2011-12-17 NOTE — Patient Instructions (Signed)
Thank you for coming in today.  I have sent in a prescription for you. Please take it once per day I have also given you a work note until you return to see me next Thursday at 2:15.  If you need anything, please do not hesitate to call.  If you feel like you want to hurt yourself or anyone else, please call 9-1-1 or go to the emergency dept.  Please remember how important your daughter is to you. Please get out of bed, and put her #1 in your every move!  Take care! See you next week! Fredrica Capano M. Ryin Ambrosius, M.D.

## 2011-12-19 NOTE — Progress Notes (Signed)
Subjective:     Patient ID: Roselyn Meier, female   DOB: 01-05-1971, 41 y.o.   MRN: [id number]  HPI Pt is a 41 yo F presenting to clinic as a same day appt for stress. She is obviously tearful and states she cannot handle the stress in her life. Her job recently told her she would be losing her position, but did not tell her when. 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 Her ex-husband has been less friendly to her and her daughter, and is not paying child support which is putting a bigger stress on her finances. He has not been abusive and she does feel safe. She feels overwhelmed. She does not feel like getting out of bed or showering. She says she felt like this before and was given Paxil which did not agree with her, and she has taken Xanax before that she did not like, but she states she needs something to face her daily life. She denies SI/HI, hearing voices or any signs of mania. Patient completed a PHQ-9 and answered "every day" to every question except the suicidal question.   History reviewed: Nonsmoker  Review of Systems See HPI above    Objective:   Physical Exam Gen: Awake, tearful. Answers questions appropriately Heart: RRR Lungs: CTAB Neuro: Grossly intact    Assessment:     41 yo F with depression secondary to stress    Plan:     See Problem list

## 2011-12-20 DIAGNOSIS — F339 Major depressive disorder, recurrent, unspecified: Secondary | ICD-10-CM | POA: Insufficient documentation

## 2011-12-20 DIAGNOSIS — F3130 Bipolar disorder, current episode depressed, mild or moderate severity, unspecified: Secondary | ICD-10-CM | POA: Insufficient documentation

## 2011-12-20 NOTE — Assessment & Plan Note (Signed)
>>  ASSESSMENT AND PLAN FOR MAJOR DEPRESSIVE DISORDER, RECURRENT EPISODE ([facility]CC) WRITTEN ON 12/20/2011  7:55 AM BY HAIRFORD, AMBER M, MD  No SI/HI today. Scored high on PHQ-9. Talked in length about ways to look at the positive in life. She should focus on her daughter. She can also ask her professor what she could do differently to make better grades. She is not in control of her job, and although this is a major stressor for her, she does not need to focus so much on it. She is requesting a work note to allow her time to "get her thoughts together" which I feel is fair. She will RTC next week for re-evaluation and we will go from there. For now, we will start Effexor XR daily for her depression and anxiety. If she has any negative thoughts, she states she will call 911.

## 2011-12-20 NOTE — Assessment & Plan Note (Signed)
No SI/HI today. Scored high on PHQ-9. Talked in length about ways to look at the positive in life. She should focus on her daughter. She can also ask her professor what she could do differently to make better grades. She is not in control of her job, and although this is a major stressor for her, she does not need to focus so much on it. She is requesting a work note to allow her time to "get her thoughts together" which I feel is fair. She will RTC next week for re-evaluation and we will go from there. For now, we will start Effexor XR daily for her depression and anxiety. If she has any negative thoughts, she states she will call 911.

## 2011-12-23 ENCOUNTER — Encounter: Payer: Self-pay | Admitting: Family Medicine

## 2011-12-23 ENCOUNTER — Ambulatory Visit (INDEPENDENT_AMBULATORY_CARE_PROVIDER_SITE_OTHER): Payer: BC Managed Care – PPO | Admitting: Family Medicine

## 2011-12-23 VITALS — BP 116/83 | HR 84 | Temp 98.8°F | Ht 68.0 in | Wt 190.2 lb

## 2011-12-23 DIAGNOSIS — F411 Generalized anxiety disorder: Secondary | ICD-10-CM

## 2011-12-23 DIAGNOSIS — F329 Major depressive disorder, single episode, unspecified: Secondary | ICD-10-CM

## 2011-12-23 NOTE — Progress Notes (Signed)
Subjective:     Patient ID: Teresa Chang, female   DOB: 1971/02/20, 41 y.o.   MRN: [id number]  HPI Pt is a 41 yo F presenting for a follow up. Last week she was seen for stress/anxiety related to social stressors.  Today, she states she does not feel much better than last week. Describes emotional, tired, wants to lay around, doesn't leave the house. Doesn't feel "herself." Feeling better: Not really Stressors: School, her daughter's father and his ex-wife. Work (spoke with boss yesterday.) How to deal: Sleep, prayer, avoidance (which she states makes her explode) Taking medication: Takes Effexor every day Side effects: Tiredness Disruption in sleep: Some. Racing thoughts. No signs of mania, per patient, although she does describe spending a lot of time on Facebook talking to different people and describes her thoughts like she "can't turn her brain off" Anhedonia: Yes SI/HI: Denies both PHQ-9: Completed at last visit and was "every day" for all questions Return to Work: Needs paperwork for [employer] filled out and returned to [employer]. Wants to "get herself together" before she goes back.  History reviewed- Everyday smoker <1/2 ppd  Review of Systems See HPI above    Objective:   Physical Exam General: Awake, alert HEENT: AT, [place] Heart: RRR Lungs: CTAB Abd: Soft, nontender Neuro: Grossly intact Psych: Well-dressed, makes eye contact. More pressured speech than last visit. Not tearful.    Assessment:     41 yo F follow-up    Plan:     See Problem list

## 2011-12-23 NOTE — Patient Instructions (Signed)
It was good to see you today.  Please continue taking the medication we started last week. I am putting in a referral for Dr. Pascal Lux here in clinic, she will call you to schedule an appointment.  Please come back to clinic in 1-2 weeks for a follow up.  Take care and let me know if you need anything!  Amber M. Hairford, M.D.

## 2011-12-23 NOTE — Assessment & Plan Note (Addendum)
I feel she is somewhat improved since her last visit, although she does not think so. No SI/HI. I did not repeat the PHQ-9 since it was high last time and she does not feel better. Continue effexor, and I am referring to Dr. Pascal Lux our clinical psychologist for further guidance. I do not feel that she has any mania, but this would be something to make sure to follow up on. RTC in 1-2 weeks for re-evaluation and to titirate up Effexor, if patient is agreeable. Patient knows to call 9-1-1, go to the ED or call this clinic if she feels she is in trouble. Completed [employer] paperwork to the best of my ability. May need additional information that I am not able to provide.

## 2011-12-23 NOTE — Assessment & Plan Note (Signed)
>>  ASSESSMENT AND PLAN FOR MAJOR DEPRESSIVE DISORDER, RECURRENT EPISODE (HCC) WRITTEN ON 12/23/2011  5:04 PM BY HAIRFORD, AMBER M, MD  I feel she is somewhat improved since her last visit, although she does not think so. No SI/HI. I did not repeat the PHQ-9 since it was high last time and she does not feel better. Continue effexor, and I am referring to Dr. Pascal Lux our clinical psychologist for further guidance. I do not feel that she has any mania, but this would be something to make sure to follow up on. RTC in 1-2 weeks for re-evaluation and to titirate up Effexor, if patient is agreeable. Patient knows to call 9-1-1, go to the ED or call this clinic if she feels she is in trouble. Completed [employer] paperwork to the best of my ability. May need additional information that I am not able to provide.

## 2011-12-23 NOTE — Assessment & Plan Note (Signed)
I believe this is a component of her current depressive state. I do not feel she needs a benzo at this time. Continue Effexor. I will refer patient to Dr. Pascal Lux as well.

## 2011-12-24 ENCOUNTER — Telehealth: Payer: Self-pay | Admitting: Psychology

## 2011-12-24 NOTE — Telephone Encounter (Signed)
Patient called to schedule an appointment upon the recommendation of Dr. Mikel Cella.  I can't get her in until the week of the 23rd.  She is on FMLA and needs to be seen ASAP.  Gave her referral to [facility] [phone].  If she can get in next week, she will see Merrianne.  Otherwise she can call me back and we can schedule for the week after.  Patient agreed with plan.

## 2011-12-27 ENCOUNTER — Telehealth: Payer: Self-pay | Admitting: Psychology

## 2011-12-27 NOTE — Telephone Encounter (Signed)
Patient left a VM stating that Teresa Chang can't get her in until the same week I can.  I called her back and left her VM with an appointment of 01/04/12 at 11:00.  She is to call to confirm.

## 2012-01-04 ENCOUNTER — Ambulatory Visit (INDEPENDENT_AMBULATORY_CARE_PROVIDER_SITE_OTHER): Payer: BC Managed Care – PPO | Admitting: Psychology

## 2012-01-04 DIAGNOSIS — F3289 Other specified depressive episodes: Secondary | ICD-10-CM

## 2012-01-04 DIAGNOSIS — F329 Major depressive disorder, single episode, unspecified: Secondary | ICD-10-CM

## 2012-01-04 NOTE — Assessment & Plan Note (Signed)
Teresa Chang is well groomed.  Thoughts are clear and goal directed.  She seems to be ambivalent about taking medication.  I asked about ambivalence toward therapy as another possible reasons she was late.  She denied.  She reports her mood has been sad and irritable for a few weeks now.  Gave feedback that I suspect medication, therapy or a combination of the two might be necessary in order to get her headed in the right direction.  Also told her that she needs to attend appointments with me on time or call if she can not make it or is going to be late.  She voiced an understanding.  She thought she might have a better shot and making an afternoon appointment.  First available was October 7th at 2:00.  Encouraged her to call [facility] to see if she could get in sooner.

## 2012-01-04 NOTE — Assessment & Plan Note (Signed)
>>  ASSESSMENT AND PLAN FOR MAJOR DEPRESSIVE DISORDER, RECURRENT EPISODE (HCC) WRITTEN ON 01/04/2012 11:48 AM BY Pascal Lux, MICHELLE E, PSYD  Teresa Chang is well groomed.  Thoughts are clear and goal directed.  She seems to be ambivalent about taking medication.  I asked about ambivalence toward therapy as another possible reasons she was late.  She denied.  She reports her mood has been sad and irritable for a few weeks now.  Gave feedback that I suspect medication, therapy or a combination of the two might be necessary in order to get her headed in the right direction.  Also told her that she needs to attend appointments with me on time or call if she can not make it or is going to be late.  She voiced an understanding.  She thought she might have a better shot and making an afternoon appointment.  First available was October 7th at 2:00.  Encouraged her to call [facility] to see if she could get in sooner.

## 2012-01-04 NOTE — Progress Notes (Signed)
Teresa Chang presented 30 minutes late for her initial beh-med appointment.  She reported she was unable to get out of bed in order to get here in time.  When asked what kept her from calling to let me know she was running late, she said she "should have."  Briefly touched on mood.  She is sad and tearful in the room.  She is taking her Effexor some of the time.  She reports missing about two doses a week.  She is not sure medicine is the answer.  She is concerned about forgetting things like taking her blood pressure medicine and a class she had for school yesterday.    She denies thoughts of hurting herself or others.

## 2012-01-05 ENCOUNTER — Ambulatory Visit (INDEPENDENT_AMBULATORY_CARE_PROVIDER_SITE_OTHER): Payer: BC Managed Care – PPO | Admitting: Family Medicine

## 2012-01-05 ENCOUNTER — Encounter: Payer: Self-pay | Admitting: Family Medicine

## 2012-01-05 VITALS — BP 129/85 | HR 85 | Temp 98.9°F | Ht 68.0 in | Wt 196.0 lb

## 2012-01-05 DIAGNOSIS — F17200 Nicotine dependence, unspecified, uncomplicated: Secondary | ICD-10-CM

## 2012-01-05 DIAGNOSIS — F329 Major depressive disorder, single episode, unspecified: Secondary | ICD-10-CM

## 2012-01-05 NOTE — Progress Notes (Signed)
Patient ID: Teresa Chang, female   DOB: 02-14-1971, 41 y.o.   MRN: [id number] [facility] Amber M. Hairford, MD Phone: [phone]   Subjective: HPI: Patient is a 41 y.o. female presenting to clinic today for follow up for depression. Concerns today include: taking effexor  1. Depression- Saw Dr. Pascal Lux briefly yesterday. Will have another appointment on Oct 7th. Pt does not like the Effexor. Would like to stick with therapy only rather than taking medication. Patient reports no changes since last visit. No increased energy. Still continues to want to stay in bed. Daughter is doing "ok". She stays with Ms. Hornik and states she is safe. School is not going well; requesting letter for making up assignments. Increased forgetfulness. Endorses anhedonia. No SI/HI or VH/AH. No HA, changes in vision, CP, Abd pain, SOB.  History Reviewed: currently everyday smoker- 1 ppd. Not ready to quit at this time, but may try patches. Health Maintenance: Declines flu shot today.  ROS: Please see HPI above.  Objective: Office vital signs reviewed.  Physical Examination:  General: Awake, alert. Tearful HEENT: Atraumatic, normocephalic Pulm: CTAB, no wheezes Cardio: RRR, no murmurs appreciated Abdomen:+BS, soft, nontender, nondistended Extremities: No edema Neuro: Grossly intact Psych: Makes eye contact. Well-groomed. Tearful but appropriate.   Assessment: 41 yo F presenting with depression  Plan: See Problem List and After Visit Summary

## 2012-01-05 NOTE — Assessment & Plan Note (Signed)
Continues to have depressive sx. Would like to d/c Effexor and stick with "dealing with her issues" and going to counseling. Will see Dr. Pascal Lux in 2 weeks; greatly appreciate her help. RTC to see me in 3-4 weeks for re-evaluation. No SI/HI. Reminded again to go to ED or call 9-1-1 if she feels her depression is more than she can deal with. Also encouraged her to spend more time out of bed.

## 2012-01-05 NOTE — Assessment & Plan Note (Signed)
>>  ASSESSMENT AND PLAN FOR MAJOR DEPRESSIVE DISORDER, RECURRENT EPISODE (HCC) WRITTEN ON 01/05/2012  5:32 PM BY HAIRFORD, AMBER M, MD  Continues to have depressive sx. Would like to d/c Effexor and stick with "dealing with her issues" and going to counseling. Will see Dr. Pascal Lux in 2 weeks; greatly appreciate her help. RTC to see me in 3-4 weeks for re-evaluation. No SI/HI. Reminded again to go to ED or call 9-1-1 if she feels her depression is more than she can deal with. Also encouraged her to spend more time out of bed.

## 2012-01-05 NOTE — Assessment & Plan Note (Signed)
Current everyday smoker, increasing amt she smokes. Not ready to quit, but thinking about it. Given info about 1-800-QUITNOW to get more information on free NRT.

## 2012-01-05 NOTE — Patient Instructions (Signed)
It was good to see you today. Please keep your appointment with Dr. Pascal Lux. Make an appointment to see me when you are here that day.  I will see you in a few weeks. Let me know if you need anything!  Timouthy Gilardi M. Gillie Fleites, M.D.

## 2012-01-17 ENCOUNTER — Ambulatory Visit: Payer: BC Managed Care – PPO | Admitting: Psychology

## 2012-01-18 ENCOUNTER — Telehealth: Payer: Self-pay | Admitting: Psychology

## 2012-01-18 NOTE — Telephone Encounter (Signed)
Patient left a VM at 9:35 this morning to say that she forgot about her appointment yesterday at 2:00.  She would like to reschedule.  Patient came significantly late to first appointment and rescheduled for this date.  I told her I would not be able to reschedule her in my clinic if she did not attend the appointment and failed to call beforehand.  Reiterated information for [employer]

## 2012-01-18 NOTE — Telephone Encounter (Signed)
Patient voiced an understanding.

## 2012-01-19 ENCOUNTER — Telehealth: Payer: Self-pay | Admitting: Family Medicine

## 2012-01-19 NOTE — Telephone Encounter (Signed)
Saw [facility] - [phone] today and needs for Dr Lula Olszewski to call Ms Charlyne Mom today to get report before patients appt tomorrow w/ chamberlain so she will know what she needs to do

## 2012-01-20 ENCOUNTER — Encounter: Payer: Self-pay | Admitting: Family Medicine

## 2012-01-20 ENCOUNTER — Ambulatory Visit (INDEPENDENT_AMBULATORY_CARE_PROVIDER_SITE_OTHER): Payer: BC Managed Care – PPO | Admitting: Family Medicine

## 2012-01-20 VITALS — BP 120/82 | HR 84 | Temp 98.0°F | Ht 68.0 in | Wt 194.0 lb

## 2012-01-20 DIAGNOSIS — I10 Essential (primary) hypertension: Secondary | ICD-10-CM

## 2012-01-20 DIAGNOSIS — F339 Major depressive disorder, recurrent, unspecified: Secondary | ICD-10-CM

## 2012-01-20 MED ORDER — LISINOPRIL-HYDROCHLOROTHIAZIDE 10-12.5 MG PO TABS: 1.0000 | ORAL_TABLET | Freq: Every day | ORAL | Status: DC

## 2012-01-20 MED ORDER — VENLAFAXINE HCL ER 37.5 MG PO CP24: 37.5000 mg | ORAL_CAPSULE | Freq: Every day | ORAL | Status: DC

## 2012-01-20 MED ORDER — CALCIUM-VITAMIN D 500-200 MG-UNIT PO TABS: 1.0000 | ORAL_TABLET | Freq: Two times a day (BID) | ORAL | Status: DC

## 2012-01-20 NOTE — Assessment & Plan Note (Addendum)
Spent >25 minutes with patient discussing her depression.  Counseled that she should try taking the Effexor and continue therapy.  Also suggested trying to get some family or friends involved in what she is going through so she has some support.  She voices understanding and says she will start taking medication.

## 2012-01-20 NOTE — Telephone Encounter (Signed)
Spoke with Ms. Charlyne Mom- she was concerned about [name] as she has not started taking an antidepressant and is really struggling to function at home, at school, and at work.  Wants me to prescribe effexor and try to talk her into taking it-   I told her I would try.

## 2012-01-20 NOTE — Assessment & Plan Note (Signed)
>>  ASSESSMENT AND PLAN FOR MAJOR DEPRESSIVE DISORDER, RECURRENT EPISODE (HCC) WRITTEN ON 01/20/2012 12:25 PM BY CHAMBERLAIN, RACHEL, MD  Spent >25 minutes with patient discussing her depression.  Counseled that she should try taking the Effexor and continue therapy.  Also suggested trying to get some family or friends involved in what she is going through so she has some support.  She voices understanding and says she will start taking medication.

## 2012-01-20 NOTE — Patient Instructions (Signed)
I am glad I got to see you today, but I am sorry you are still feeling so badly.  It is really important that you start taking the Effexor- remember it is a safe and non-addictive medication.  Please set an alarm on your phone or come up with another way to remember to take all your medications.    Please try to reach out to some friends that you can talk to about what is going on- It sounds like you have a lot of people who care about you.    Please keep your therapy appointment and be on time.  Please make an appointment to see me in 2 weeks.

## 2012-01-20 NOTE — Progress Notes (Signed)
  Subjective:    Patient ID: Teresa Chang, female    DOB: Dec 10, 1970, 41 y.o.   MRN: [id number]  HPI  Teresa Chang comes in for follow up of her depression.  She says she is not doing well.  She went and saw the therapist for the first time yesterday, and they had a serious discussion about her depression because Teresa Chang has refused to take medications so far this episode.  She says that she is having difficulty with school due to concentration and motivation problems, she needs paperwork filled out so she can get extra time on tests and and extension for a project.    She denies any SI/HI, but endorses depressed mood, insomnia, anhedonia, difficulty concentrating.   Review of Systems See HPI    Objective:   Physical Exam BP 120/82  Pulse 84  Temp 98 F (36.7 C) (Oral)  Ht 5\' 8"  (1.727 m)  Wt 194 lb (87.998 kg)  BMI 29.50 kg/m2  LMP 01/06/2012 General appearance: alert, cooperative and no distress, somewhat disheveled Psych: Depressed mood, normal affect, normal judgement and thought content PHQ9: 23       Assessment & Plan:

## 2012-02-01 ENCOUNTER — Telehealth: Payer: Self-pay | Admitting: Family Medicine

## 2012-02-01 NOTE — Telephone Encounter (Signed)
[employer] is calling needing to know why the patient is able to go to school and not work.  She needs specifics like the severity of her Dx.

## 2012-02-03 ENCOUNTER — Encounter: Payer: Self-pay | Admitting: Family Medicine

## 2012-02-03 ENCOUNTER — Ambulatory Visit (INDEPENDENT_AMBULATORY_CARE_PROVIDER_SITE_OTHER): Payer: BC Managed Care – PPO | Admitting: Family Medicine

## 2012-02-03 VITALS — BP 123/84 | HR 90 | Temp 98.6°F | Ht 68.0 in | Wt 198.0 lb

## 2012-02-03 DIAGNOSIS — F3130 Bipolar disorder, current episode depressed, mild or moderate severity, unspecified: Secondary | ICD-10-CM

## 2012-02-03 MED ORDER — DIVALPROEX SODIUM 250 MG PO DR TAB: 250.0000 mg | DELAYED_RELEASE_TABLET | Freq: Three times a day (TID) | ORAL | Status: DC

## 2012-02-03 NOTE — Progress Notes (Signed)
  Subjective:    Patient ID: Teresa Chang, female    DOB: 06-26-1970, 41 y.o.   MRN: [id number]  HPI  Teresa Chang comes in for follow up.  She begins the visit by telling me that she has a lot of things on her mind, and school is really important, and she stayed up really late on Monday working on a paper and then missed her Therapy appointment Tuesday morning.  She says that Arletta Bale (her therapist) called her and gave her a hard time for missing her appointment, and told her she owed her $35 for the missed appointment.  Teresa Chang is very upset that the therapist made such a big deal about her missing the appointment.    She says she took the effexor two times since seeing me two weeks ago, but it gave her a headache so she did not continue it.  She says her mood is a little better but she is still having significant difficulty with concentration, sleep disturbance.   Patient Active Problem List  Diagnosis  . OBESITY, NOS  . ANXIETY DISORDER  . TOBACCO DEPENDENCE  . HYPERTENSION  . VAGINAL DISCHARGE  . OSTEOARTHRITIS, MULTI SITES  . Shortness of breath  . Fatigue  . Paraspinal muscle spasm  . Major depressive disorder, recurrent episode  . Bipolar disorder current episode depressed   History  Substance Use Topics  . Smoking status: Current Every Day Smoker -- 0.3 packs/day    Types: Cigarettes  . Smokeless tobacco: Not on file  . Alcohol Use: Not on file    Review of Systems See HPI    Objective:   Physical Exam  Vitals reviewed. Psychiatric: Her behavior is normal. Thought content normal. Her mood appears anxious. Her speech is rapid and/or pressured and tangential. Cognition and memory are normal. She expresses inappropriate judgment. She exhibits a depressed mood.   PHQ9: 21, but denies SI/HI MDQ: +8 questions out of #1, Yes to #2, "Serious Problem to #3"       Assessment & Plan:

## 2012-02-03 NOTE — Patient Instructions (Addendum)
I am sorry you are not feeling much better.  I am concerned that you might have bipolar depression instead of regular depression.  I want you to stop the effexor. Please start taking Depakote, one pill three times a day, this should help your mood.  I am also referring you to Psychiatry to help sort out exactly what is going on.   Please be sure to make all your appointments and take all your medications, if you are not compliant with treatment then you will probably lose your short term disability.

## 2012-02-03 NOTE — Assessment & Plan Note (Addendum)
Based on today's history and exam, I am now concerned about a Bipolar spectrum disorder.  D/C effexor, will Rx depakote and stressed importance of medication compliance.   Spent >30 minutes with patient discussing importance of therapy and medication compliance, as her therapist and I are both filling out paperwork to help her with school and short term disability at work.  I told her that she would likely lose her disability and possibly her job if she does not show she is trying to get better.   Will refer to Psychiatry for evaluation and management.  No SI/HI, no indication for inpatient treatment at this time.  Gave patient contact info for a Psychiatrist, told her she must take initiative for treatment.

## 2012-02-07 NOTE — Telephone Encounter (Signed)
Tried to return call, left message with Sadie at [phone].   Will try again tomorrow.

## 2012-02-07 NOTE — Telephone Encounter (Signed)
[id number] - [employer] - Claim # [id number] - they are waiting for Dr. Lula Olszewski to respond to them and they are holding her Disability for 3 weeks and is just waiting on the doctor.

## 2012-02-08 NOTE — Telephone Encounter (Signed)
Tried to call, left voicemail again for Teresa Chang.

## 2012-02-15 ENCOUNTER — Telehealth: Payer: Self-pay | Admitting: Family Medicine

## 2012-02-15 NOTE — Telephone Encounter (Signed)
Is asking to speak with Dr Lula Olszewski about patients ability to go to school and do projects and not be able to work.  Is not sure why she is able to go to school.

## 2012-02-16 NOTE — Telephone Encounter (Signed)
Pt is also calling to talk to Dr Antionette Char - she is not in school - she is about to get cut off with her utilities - she has not gotten paid in over a month

## 2012-02-17 NOTE — Telephone Encounter (Signed)
[employer] has called asking for her visit note from 10/24 to be sent to Sadie at [facility].  After I got off of the phone with [employer], the patient called.  She is apologetic because of her frustration with the insurance company, she says she know Dr. Lula Olszewski is doing what she is supposed do, but she is asking if Dr. Lula Olszewski can take care of this as soon as possible because they are really "Wiggin" her out.

## 2012-02-17 NOTE — Telephone Encounter (Signed)
Please print out my most recent office visit with Ms. Melnik and fax to [employer].

## 2012-02-17 NOTE — Telephone Encounter (Signed)
Called [employer] and spoke with [employer], fax # [phone] with claim # [id number]. Notes faxed.

## 2012-02-17 NOTE — Telephone Encounter (Signed)
Called and left Teresa Chang a message again.  Unfortunately she does not seem to answer the phone, it goes straight to voicemail.  I let her know that Teresa Chang is no longer going to school and that I have referred her to Psychiatry as I am concerned her depression and mood disorder is more severe than should be handled in primary care.    I called the patient, a woman who said she was her sister answered and said Teresa Chang was not there right now.  I asked her to let Teresa Chang know Is called.   Xzavien Harada 02/17/2012 8:38 AM

## 2012-02-23 ENCOUNTER — Telehealth: Payer: Self-pay | Admitting: Family Medicine

## 2012-02-23 NOTE — Telephone Encounter (Signed)
Patient is calling because she misplaced the info that Dr. Lula Olszewski gave to her about the doctor she was being sent to.  She would appreciate a call with that info.

## 2012-02-23 NOTE — Telephone Encounter (Signed)
Left message on voicemail that I was unsure what information Dr. Lula Olszewski gave her and I would ask when she returns to office and give her a call back.

## 2012-02-25 NOTE — Telephone Encounter (Signed)
LMOVM for pt to return call .Fleeger, Jessica Dawn  

## 2012-02-25 NOTE — Telephone Encounter (Signed)
Teresa Chang can call any Psychiatrist in town and see if they accept her insurance, the Psychiatrist offices require her to call and schedule the appointments.  I did give her a recommendation from Dr. Pascal Lux, but I did not keep this information.  Dr. Pascal Lux is not in the office today but if [name] wants I can ask her on Monday.  Please let her know.

## 2012-02-28 NOTE — Telephone Encounter (Signed)
Pt called back and given message - gave her the number for Dr Nolen Mu - [phone]

## 2013-01-31 ENCOUNTER — Other Ambulatory Visit: Payer: Self-pay | Admitting: Family Medicine

## 2013-01-31 NOTE — Telephone Encounter (Signed)
Pt requesting that refill be submitted back asap because she's completely out of them and is feeling bad.  Please call patient regarding this if unable to fill before end of day.

## 2013-01-31 NOTE — Telephone Encounter (Signed)
Medication refilled for one month. Patient has not been seen in over a year. She needs to come in for an appointment prior to additional refills being given.

## 2013-03-11 ENCOUNTER — Other Ambulatory Visit: Payer: Self-pay | Admitting: Family Medicine

## 2013-03-14 NOTE — Telephone Encounter (Signed)
Please call the patient to let her know she needs to be seen for future refills as she has not been seen in a year.

## 2013-03-15 NOTE — Telephone Encounter (Signed)
Pt is aware and knows that she needs to make an appt.  She will call back to do it. Jazmin Hartsell,CMA

## 2013-04-17 ENCOUNTER — Other Ambulatory Visit: Payer: Self-pay | Admitting: Family Medicine

## 2013-04-17 NOTE — Telephone Encounter (Signed)
Pt called and would like a refill on her BP medication. I explained that the doctor needed to see her since it has been over a year and also that was what Dr. Birdie SonsSonnenberg said. She said she needs her medication and does not have time to come in because her Medicaid card has a different name on it and is waiting for an appointment to have it changed. She wants her medication today and does not want to wait. I also told her we could still see her so that she can see the doctor and get her medication. She does not want to do that. jw

## 2013-04-17 NOTE — Telephone Encounter (Signed)
I will refill for one month. She needs to be seen prior to additional refills. Please inform the patient.

## 2013-04-18 NOTE — Telephone Encounter (Signed)
Pt states that she is trying to get her insurance card fix because there was a mix up when renewed.  It came to her with another office name on it and she has been coming here for a long time she said.  Will make an appt to be seen when she does. Yoselyn Mcglade,CMA

## 2013-06-18 ENCOUNTER — Ambulatory Visit (INDEPENDENT_AMBULATORY_CARE_PROVIDER_SITE_OTHER): Payer: Medicaid Other | Admitting: Family Medicine

## 2013-06-18 ENCOUNTER — Encounter: Payer: Self-pay | Admitting: Family Medicine

## 2013-06-18 VITALS — BP 142/91 | HR 84 | Temp 98.5°F | Wt 206.0 lb

## 2013-06-18 DIAGNOSIS — I10 Essential (primary) hypertension: Secondary | ICD-10-CM

## 2013-06-18 DIAGNOSIS — Z23 Encounter for immunization: Secondary | ICD-10-CM

## 2013-06-18 DIAGNOSIS — Z0000 Encounter for general adult medical examination without abnormal findings: Secondary | ICD-10-CM

## 2013-06-18 LAB — COMPREHENSIVE METABOLIC PANEL
ALT: 17 U/L (ref 0–35)
AST: 19 U/L (ref 0–37)
Albumin: 3.9 g/dL (ref 3.5–5.2)
Alkaline Phosphatase: 63 U/L (ref 39–117)
BILIRUBIN TOTAL: 0.3 mg/dL (ref 0.2–1.2)
BUN: 12 mg/dL (ref 6–23)
CHLORIDE: 106 meq/L (ref 96–112)
CO2: 25 mEq/L (ref 19–32)
CREATININE: 0.85 mg/dL (ref 0.50–1.10)
Calcium: 9 mg/dL (ref 8.4–10.5)
GLUCOSE: 86 mg/dL (ref 70–99)
POTASSIUM: 4.4 meq/L (ref 3.5–5.3)
SODIUM: 138 meq/L (ref 135–145)
Total Protein: 6.3 g/dL (ref 6.0–8.3)

## 2013-06-18 LAB — LIPID PANEL
Cholesterol: 175 mg/dL (ref 0–200)
HDL: 48 mg/dL (ref >39)
LDL Cholesterol: 112 mg/dL — ABNORMAL HIGH (ref 0–99)
Total CHOL/HDL Ratio: 3.6 Ratio
Triglycerides: 75 mg/dL (ref <150)
VLDL: 15 mg/dL (ref 0–40)

## 2013-06-18 MED ORDER — LISINOPRIL-HYDROCHLOROTHIAZIDE 10-12.5 MG PO TABS: ORAL_TABLET | ORAL | Status: DC

## 2013-06-18 NOTE — Assessment & Plan Note (Addendum)
Not at goal, though not taken meds in past month. Refills given. Will check electrolytes and lipid panel. Discussed exercise. F/u in one month.

## 2013-06-18 NOTE — Progress Notes (Signed)
Patient ID: Roselyn MeierLaurie A Luna, female   DOB: 09-12-1970, 43 y.o.   MRN: [id number]  Marikay AlarEric Sevanna Ballengee, MD Phone: [phone]  Roselyn MeierLaurie A Bolla is a 43 y.o. female who presents today for HTN f/u.  HYPERTENSION Disease Monitoring Home BP Monitoring not checking Chest pain- no     Dyspnea- no Medications Compliance-  Not taking meds in last month. Has been doing apple cider vinegar. Edema- no Not exercising.  Patient is a former smoker.  ROS: Per HPI   Physical Exam Filed Vitals:   06/18/13 1020  BP: 142/91  Pulse: 84  Temp: 98.5 F (36.9 C)    Physical Examination: General appearance - alert, well appearing, and in no distress Chest - clear to auscultation, no wheezes, rales or rhonchi, symmetric air entry Heart - normal rate, regular rhythm, normal S1, S2, no murmurs, rubs, clicks or gallops Extremities - no pedal edema noted   Assessment/Plan: Please see individual problem list.

## 2013-06-18 NOTE — Assessment & Plan Note (Signed)
Tdap given. Advised to make f/u appointment for pap smear.

## 2013-06-18 NOTE — Patient Instructions (Signed)
Nice to meet you.  Please take your blood pressure medication as prescribed. Try to start walking 30 minutes 3-4 days per week. Schedule an appointment for a pap smear in the next month.

## 2013-06-19 ENCOUNTER — Encounter: Payer: Self-pay | Admitting: Family Medicine

## 2013-07-20 ENCOUNTER — Telehealth: Payer: Self-pay | Admitting: Family Medicine

## 2013-07-20 ENCOUNTER — Encounter: Payer: Self-pay | Admitting: *Deleted

## 2013-07-20 NOTE — Telephone Encounter (Signed)
Attempted to call pts back several times but line was busy.  Placed a letter up front, but pt has not had a PHYSICAL lately. Letter states that she has a health maintenance exam. Teresa Chang

## 2013-07-20 NOTE — Telephone Encounter (Signed)
Pt called and now needs a letter stating that she has had her yearly checkup. Please call when ready. jw

## 2013-07-20 NOTE — Telephone Encounter (Signed)
Pt informed that imm. Record was ready for pick up. Teresa PellegriniJessica D Endi Lagman

## 2013-07-20 NOTE — Telephone Encounter (Signed)
Pt called and would like documentation that she had her Tdap. Please call when it is ready for pickup.jw

## 2013-07-23 ENCOUNTER — Ambulatory Visit (INDEPENDENT_AMBULATORY_CARE_PROVIDER_SITE_OTHER): Payer: Medicaid Other | Admitting: *Deleted

## 2013-07-23 DIAGNOSIS — Z111 Encounter for screening for respiratory tuberculosis: Secondary | ICD-10-CM

## 2013-07-25 ENCOUNTER — Ambulatory Visit (INDEPENDENT_AMBULATORY_CARE_PROVIDER_SITE_OTHER): Payer: Medicaid Other | Admitting: *Deleted

## 2013-07-25 ENCOUNTER — Encounter: Payer: Self-pay | Admitting: *Deleted

## 2013-07-25 ENCOUNTER — Telehealth: Payer: Self-pay | Admitting: *Deleted

## 2013-07-25 DIAGNOSIS — Z111 Encounter for screening for respiratory tuberculosis: Secondary | ICD-10-CM

## 2013-07-25 LAB — TB SKIN TEST
Induration: 0 mm
TB SKIN TEST: NEGATIVE

## 2013-07-25 NOTE — Telephone Encounter (Signed)
Pt dropped off work form to be completed by physician.  Form placed in provider for review.  Clovis Puamika L Martin, RN

## 2013-09-13 ENCOUNTER — Other Ambulatory Visit: Payer: Self-pay | Admitting: *Deleted

## 2013-09-13 ENCOUNTER — Telehealth: Payer: Self-pay | Admitting: Family Medicine

## 2013-09-13 MED ORDER — LISINOPRIL-HYDROCHLOROTHIAZIDE 10-12.5 MG PO TABS: ORAL_TABLET | ORAL | Status: DC

## 2013-09-13 NOTE — Telephone Encounter (Signed)
Pt is aware of this.  States that she will have to call back and schedule an appt later.  She is not used to having to come back every 3 months.  Her last MD refilled her medication for a whole year and is now unsure why she can't get that anymore. Jazmin Hartsell,CMA

## 2013-09-13 NOTE — Telephone Encounter (Signed)
Pt called and needs refill on her BP medication called in. She has been out for 2 days now. jw

## 2013-09-13 NOTE — Telephone Encounter (Signed)
Patient should be advised to follow-up in the next month for her blood pressure.

## 2013-09-14 NOTE — Telephone Encounter (Signed)
Patients last blood pressure was above goal. She had been without her medication at that time and restarted her medication. Given that she was above goal I would like her to come back in more frequently than previously until she is at goal with her blood pressure. Please inform patient of this.

## 2013-12-24 ENCOUNTER — Telehealth: Payer: Self-pay | Admitting: Family Medicine

## 2013-12-24 DIAGNOSIS — I10 Essential (primary) hypertension: Secondary | ICD-10-CM

## 2013-12-24 NOTE — Telephone Encounter (Signed)
Pt called and would like a refill on her lisinopril called. She is NOT coming in for an appointment. He needs to call in a 12 month supply of medication. She said that it is not his job to decided when she comes in. Her last doctor called in 12 months at a time. I explained that since her BP was higher than the goal this is why he wanted to see her, plus he seen her in March so she doesn't feel she needs to come in. She wants this today. I offered an appointment and that Dr. Birdie Sons might be able to send in a 30 day supple until her appointment and she said No just give her the medication.  She also said that this was unacceptable that we will not do this for her.She is very rude and talks down to me the whole time. jw

## 2013-12-25 MED ORDER — LISINOPRIL-HYDROCHLOROTHIAZIDE 10-12.5 MG PO TABS: ORAL_TABLET | ORAL | Status: DC

## 2013-12-25 NOTE — Telephone Encounter (Signed)
Called to schedule pt.  Advised that we are closed every day between 12:30 and 1:30.  She is not happy with this answer especially since our last appt in the am is 1130 and does not start back until 2pm.  She begins to argue again and telling me that she is in her 90 day probationary period at work and cant leave.  I was able to appease her with a 415 appt next Friday.  States she would be able to get off at 4 and be here in 10 - 15 mins.   Parke Jandreau, Maryjo Rochester

## 2013-12-25 NOTE — Telephone Encounter (Signed)
Spoke with patient regarding her phone call yesterday. I informed the patient that given that she was off her lisinopril HCTZ for a month I wanted her to return to clinic for blood work to ensure her kidney function has remained stable on this medication and to have her blood pressure rechecked after restarting the medication since she was above goal at her last visit. She voiced understanding of this, though stated she just started a new job and has difficulty getting away from work until after 4 pm. I advised that I would really like her to come in for lab work through a lab appointment and for a blood pressure check with a nurse. I would prefer that she come in for a follow-up visit, though this seemed to be a point of contention with the patient and she repeatedly stated that her last doctor only asked her to come in once a year for follow-up. I will send this message to the blue team nurses to contact the patient to get a lab appointment for a BMET and nurse visit for a blood pressure check set up. The patient noted that she gets a lunch break starting at 12 and gets off work at 4, so around those times would work best.

## 2014-07-11 ENCOUNTER — Other Ambulatory Visit: Payer: Self-pay | Admitting: Family Medicine

## 2014-07-11 NOTE — Telephone Encounter (Signed)
Pt called and needs a refill on her Lisinopril. jw  °

## 2014-07-12 MED ORDER — LISINOPRIL-HYDROCHLOROTHIAZIDE 10-12.5 MG PO TABS: ORAL_TABLET | ORAL | Status: DC

## 2014-07-12 NOTE — Telephone Encounter (Signed)
Patient needs follow-up for blood pressure. Has been one year since she has been seen and was supposed to follow-up one month after last visit.

## 2014-07-12 NOTE — Telephone Encounter (Signed)
Mailed letter today for FU appt for HTN. Garland Smouse, CMA.

## 2014-09-03 ENCOUNTER — Other Ambulatory Visit: Payer: Self-pay | Admitting: Family Medicine

## 2014-09-03 MED ORDER — LISINOPRIL-HYDROCHLOROTHIAZIDE 10-12.5 MG PO TABS: ORAL_TABLET | ORAL | Status: DC

## 2014-09-03 NOTE — Telephone Encounter (Signed)
Will give one month of refills. Patient has not been seen in over a year in our office. Please advise her that she needs to schedule an appointment for follow-up in the next month. Thanks.

## 2014-09-03 NOTE — Telephone Encounter (Signed)
Patient need refill on her lisinopril  medication.  Having headaches due to elevation of bp.  Out of her medication.

## 2014-09-04 NOTE — Telephone Encounter (Signed)
Letter mailed to patient. Amilah Greenspan,CMA  

## 2014-11-05 ENCOUNTER — Ambulatory Visit (INDEPENDENT_AMBULATORY_CARE_PROVIDER_SITE_OTHER): Payer: Medicaid Other | Admitting: Internal Medicine

## 2014-11-05 ENCOUNTER — Encounter: Payer: Self-pay | Admitting: Internal Medicine

## 2014-11-05 VITALS — BP 128/87 | HR 78 | Temp 98.4°F | Ht 68.0 in | Wt 201.0 lb

## 2014-11-05 DIAGNOSIS — I10 Essential (primary) hypertension: Secondary | ICD-10-CM

## 2014-11-05 DIAGNOSIS — Z202 Contact with and (suspected) exposure to infections with a predominantly sexual mode of transmission: Secondary | ICD-10-CM

## 2014-11-05 LAB — COMPREHENSIVE METABOLIC PANEL
ALK PHOS: 60 U/L (ref 33–115)
ALT: 10 U/L (ref 6–29)
AST: 12 U/L (ref 10–35)
Albumin: 3.8 g/dL (ref 3.6–5.1)
BILIRUBIN TOTAL: 0.2 mg/dL (ref 0.2–1.2)
BUN: 10 mg/dL (ref 7–25)
CALCIUM: 8.6 mg/dL (ref 8.6–10.2)
CO2: 23 mEq/L (ref 20–31)
CREATININE: 0.99 mg/dL (ref 0.50–1.10)
Chloride: 107 mEq/L (ref 98–110)
Glucose, Bld: 107 mg/dL — ABNORMAL HIGH (ref 65–99)
POTASSIUM: 4.1 meq/L (ref 3.5–5.3)
Sodium: 139 mEq/L (ref 135–146)
Total Protein: 6 g/dL — ABNORMAL LOW (ref 6.1–8.1)

## 2014-11-05 MED ORDER — LISINOPRIL-HYDROCHLOROTHIAZIDE 10-12.5 MG PO TABS: ORAL_TABLET | ORAL | Status: DC

## 2014-11-05 NOTE — Progress Notes (Signed)
   [facility] Phone: [phone]  Subjective:  Teresa Chang is a 44 year old female here for follow-up of her HTN and for possible STD exposure.  HTN: She has taken Lisinopril-HCTZ for the last couple of years. She is very happy with this medication. She ran out of it one month ago and has not taken anything since she ran out. She has been having headaches since she ran out, which is normal for her whenever her BP is high. She has also noticed swelling in her feet over the past month. She denies any blurred vision. She feels well overall. She has not experienced any side effects from the medication.  Possible STD Exposure: She has been with the same partner for the last couple of years. She just recently found out that her partner cheated on her, so she is unsure if she has been exposed to an STD. She would like to be tested for HIV and syphilis today. She does not want to have a pelvic exam because she is currently menstruating. She would like to defer testing for Chlamydia and Gonorrhea at this time, but will consider these in the future. She has not had any vaginal discharge, vaginal lesions, vaginal irritation, fevers, chills, nausea, or vomiting.  All relevant systems were reviewed and were negative unless otherwise noted in the HPI  Past Medical History- significant for HTN Reviewed problem list.  Medications- reviewed and updated Current Outpatient Prescriptions  Medication Sig Dispense Refill  . lisinopril-hydrochlorothiazide (PRINZIDE,ZESTORETIC) 10-12.5 MG per tablet TAKE ONE TABLET BY MOUTH ONCE DAILY 90 tablet 3   No current facility-administered medications for this visit.   Chief complaint-noted No additions to family history Social history- patient is a current smoker.  Objective: BP 128/87 mmHg  Pulse 78  Temp(Src) 98.4 F (36.9 C) (Oral)  Ht  (1.727 m)  Wt 201 lb (91.173 kg)  BMI 30.57 kg/m2  LMP 11/05/2014 Gen: NAD, alert, cooperative  with exam HEENT: NCAT, EOMI, PERRL Neck: FROM, supple CV: RRR, good S1/S2, no murmur Resp: CTABL, no wheezes, non-labored Abd: SNTND, BS present, no guarding or organomegaly Ext: 1+ edema in lower extremities bilaterally, moves UE/LE spontaneously Neuro: Alert and oriented, no gross deficits Skin: no rashes, no lesions  Assessment/Plan: See problem based a/p  Possible STD Exposure: Pt only has one sexual partner but he recently cheated on her. She is requesting HIV and Syphilis testing right now but is deferring Gonorrhea and Chlamydia at this time. She does not want to have a pelvic exam right now because she is menstruating. -Will order HIV and Syphilis testing today -Consider future Gonorrhea and Chlamydia testing -Pt to follow-up for well woman exam with PCP.   Willadean Carol, MD PGY-1

## 2014-11-05 NOTE — Patient Instructions (Signed)
It was nice to meet you! Thank you for coming into clinic today.  I have refilled your Lisinopril-HCTZ medication for your blood pressure.  I have also order some labs for you today. You should receive these results in the mail.  Please schedule an annual exam with your PCP.  -Dr. Nancy Marus

## 2014-11-05 NOTE — Assessment & Plan Note (Addendum)
Pt with BP of 128/87 today. She has not taken her BP medication in 1 month because she ran out of pills. Currently taking Lisinopril-HCTZ 10-12.5mg . Although her BP looks good today, she is having headaches and LE swelling that she does not have when she takes the BP medication. -Will refill Lisinopril-HCTZ today -Will order CMet today as a part of annual monitoring in patients with HTN -Pt to follow-up with PCP for annual physical exam

## 2014-11-06 LAB — RPR

## 2014-11-06 LAB — HIV ANTIBODY (ROUTINE TESTING W REFLEX): HIV 1&2 Ab, 4th Generation: NONREACTIVE

## 2014-11-07 ENCOUNTER — Encounter: Payer: Self-pay | Admitting: Internal Medicine

## 2015-05-07 ENCOUNTER — Encounter (HOSPITAL_COMMUNITY): Payer: Self-pay | Admitting: Emergency Medicine

## 2015-05-07 ENCOUNTER — Emergency Department (HOSPITAL_COMMUNITY)
Admission: EM | Admit: 2015-05-07 | Discharge: 2015-05-07 | Disposition: A | Discharge status: Home / Self Care | Payer: Self-pay | Attending: Emergency Medicine | Admitting: Emergency Medicine

## 2015-05-07 DIAGNOSIS — Z79899 Other long term (current) drug therapy: Secondary | ICD-10-CM | POA: Insufficient documentation

## 2015-05-07 DIAGNOSIS — Z8659 Personal history of other mental and behavioral disorders: Secondary | ICD-10-CM | POA: Insufficient documentation

## 2015-05-07 DIAGNOSIS — J069 Acute upper respiratory infection, unspecified: Secondary | ICD-10-CM

## 2015-05-07 DIAGNOSIS — I10 Essential (primary) hypertension: Secondary | ICD-10-CM | POA: Insufficient documentation

## 2015-05-07 DIAGNOSIS — Z87891 Personal history of nicotine dependence: Secondary | ICD-10-CM | POA: Insufficient documentation

## 2015-05-07 MED ORDER — FLUTICASONE PROPIONATE 50 MCG/ACT NA SUSP: 2.0000 | Freq: Every day | NASAL | Status: DC

## 2015-05-07 NOTE — ED Notes (Signed)
Declined W/C at D/C and was escorted to lobby by RN. 

## 2015-05-07 NOTE — ED Provider Notes (Signed)
CSN: [id number]     Arrival date & time 05/07/15  1157 History   First MD Initiated Contact with Patient 05/07/15 1229     Chief Complaint  Patient presents with  . URI    HPI   45 year old female presents today with complaints of nasal congestion and rhinorrhea, chills and fatigue. Patient reports symptoms started 3 days ago, have persistent since. Patient denies any chest pain, cough, shortness of breath, fever, nausea, vomiting, or any other concerning signs or symptoms. Patient reports that her daughters are experiencing similar symptoms. Patient has tried DayQuil, no other over-the-counter medications. Patient has no significant past medical history including asthma COPD. Patient does not smoke.    Past Medical History  Diagnosis Date  . Hypertension   . Depression   . Bipolar disorder (HCC)   . Anxiety    History reviewed. No pertinent past surgical history. No family history on file. Social History  Substance Use Topics  . Smoking status: Former Smoker -- 0.30 packs/day  . Smokeless tobacco: None  . Alcohol Use: No   OB History    No data available     Review of Systems  All other systems reviewed and are negative.     Allergies  Review of patient's allergies indicates no known allergies.  Home Medications   Prior to Admission medications   Medication Sig Start Date End Date Taking? Authorizing Provider  fluticasone (FLONASE) 50 MCG/ACT nasal spray Place 2 sprays into both nostrils daily. 05/07/15   Eyvonne Mechanic, PA-C  lisinopril-hydrochlorothiazide (PRINZIDE,ZESTORETIC) 10-12.5 MG per tablet TAKE ONE TABLET BY MOUTH ONCE DAILY 11/05/14   Campbell Stall, MD   BP 119/98 mmHg  Pulse 95  Temp(Src) 98.8 F (37.1 C)  Resp 16  SpO2 98%  LMP 04/16/2015 Physical Exam  Constitutional: She is oriented to person, place, and time. She appears well-developed and well-nourished.  HENT:  Head: Normocephalic and atraumatic.  Right Ear: Hearing and tympanic membrane  normal.  Left Ear: Hearing and tympanic membrane normal.  Nose: Rhinorrhea present.  No significant tenderness to percussion of the maxillary or frontal sinuses, no purulent discharge  Eyes: Conjunctivae are normal. Pupils are equal, round, and reactive to light. Right eye exhibits no discharge. Left eye exhibits no discharge. No scleral icterus.  Neck: Normal range of motion. No JVD present. No tracheal deviation present.  Pulmonary/Chest: Effort normal. No stridor. No respiratory distress. She has no wheezes. She has no rales. She exhibits no tenderness.  Neurological: She is alert and oriented to person, place, and time. Coordination normal.  Psychiatric: She has a normal mood and affect. Her behavior is normal. Judgment and thought content normal.  Nursing note and vitals reviewed.   ED Course  Procedures (including critical care time) Labs Review Labs Reviewed - No data to display  Imaging Review No results found. I have personally reviewed and evaluated these images and lab results as part of my medical decision-making.   EKG Interpretation None      MDM   Final diagnoses:  URI (upper respiratory infection)    Labs:  Imaging:  Consults:  Therapeutics:  Discharge Meds:   Assessment/Plan: Patient's most consistent with viral URI, she has no fever, vital signs are reassuring. Patient has no cough, chest pain, or shortness of breath. No need for chest x-ray at this time. Patient be discharged home with Flonase, symptomatic care instructions and strict return percussions.         Eyvonne Mechanic, PA-C 05/07/15 1320  Benjiman Core, MD 05/08/15 [phone]

## 2015-05-07 NOTE — ED Notes (Signed)
Patient states cold symptoms with nasal congestion, sore throat since Sunday.  Patient states that she is getting clear yield from nose.   Patient states no chest congestion or coughing.   Patient denies other symptoms.

## 2015-12-17 ENCOUNTER — Other Ambulatory Visit: Payer: Self-pay | Admitting: Internal Medicine

## 2015-12-17 DIAGNOSIS — I10 Essential (primary) hypertension: Secondary | ICD-10-CM

## 2015-12-17 NOTE — Telephone Encounter (Signed)
Rx filled.  Katina Degreealeb M. Jimmey RalphParker, MD [facility] Resident PGY-3 12/17/2015 11:57 AM

## 2016-01-02 ENCOUNTER — Emergency Department (HOSPITAL_COMMUNITY)
Admission: EM | Admit: 2016-01-02 | Discharge: 2016-01-02 | Disposition: A | Discharge status: Home / Self Care | Payer: Self-pay | Attending: Emergency Medicine | Admitting: Emergency Medicine

## 2016-01-02 ENCOUNTER — Encounter (HOSPITAL_COMMUNITY): Payer: Self-pay

## 2016-01-02 DIAGNOSIS — K5900 Constipation, unspecified: Secondary | ICD-10-CM | POA: Insufficient documentation

## 2016-01-02 DIAGNOSIS — I10 Essential (primary) hypertension: Secondary | ICD-10-CM | POA: Insufficient documentation

## 2016-01-02 DIAGNOSIS — Z79899 Other long term (current) drug therapy: Secondary | ICD-10-CM | POA: Insufficient documentation

## 2016-01-02 DIAGNOSIS — Z87891 Personal history of nicotine dependence: Secondary | ICD-10-CM | POA: Insufficient documentation

## 2016-01-02 LAB — CBC
HEMATOCRIT: 35.4 % — AB (ref 36.0–46.0)
HEMOGLOBIN: 11.6 g/dL — AB (ref 12.0–15.0)
MCH: 25.2 pg — AB (ref 26.0–34.0)
MCHC: 32.8 g/dL (ref 30.0–36.0)
MCV: 77 fL — AB (ref 78.0–100.0)
Platelets: 288 10*3/uL (ref 150–400)
RBC: 4.6 MIL/uL (ref 3.87–5.11)
RDW: 14 % (ref 11.5–15.5)
WBC: 10.1 10*3/uL (ref 4.0–10.5)

## 2016-01-02 LAB — COMPREHENSIVE METABOLIC PANEL
ALBUMIN: 4 g/dL (ref 3.5–5.0)
ALK PHOS: 71 U/L (ref 38–126)
ALT: 25 U/L (ref 14–54)
ANION GAP: 10 (ref 5–15)
AST: 23 U/L (ref 15–41)
BUN: 16 mg/dL (ref 6–20)
CALCIUM: 9.1 mg/dL (ref 8.9–10.3)
CO2: 25 mmol/L (ref 22–32)
Chloride: 101 mmol/L (ref 101–111)
Creatinine, Ser: 1.21 mg/dL — ABNORMAL HIGH (ref 0.44–1.00)
GFR calc Af Amer: >60 mL/min (ref >60)
GFR calc non Af Amer: 53 mL/min — ABNORMAL LOW (ref >60)
GLUCOSE: 144 mg/dL — AB (ref 65–99)
Potassium: 3.4 mmol/L — ABNORMAL LOW (ref 3.5–5.1)
SODIUM: 136 mmol/L (ref 135–145)
Total Bilirubin: 0.4 mg/dL (ref 0.3–1.2)
Total Protein: 7.7 g/dL (ref 6.5–8.1)

## 2016-01-02 LAB — I-STAT BETA HCG BLOOD, ED (MC, WL, AP ONLY)

## 2016-01-02 LAB — LIPASE, BLOOD: LIPASE: 21 U/L (ref 11–51)

## 2016-01-02 MED ORDER — POLYETHYLENE GLYCOL 3350 17 G PO PACK: 17.0000 g | PACK | Freq: Every day | ORAL | 0 refills | Status: DC

## 2016-01-02 MED ORDER — FLEET ENEMA 7-19 GM/118ML RE ENEM
1.0000 | ENEMA | Freq: Once | RECTAL | Status: AC
  Administered 2016-01-02: 1 via RECTAL
  Filled 2016-01-02: qty 1

## 2016-01-02 NOTE — ED Triage Notes (Signed)
Pt here with abdominal pain. Pt had episodes of vomiting yesterday and was vomiting "blood".  Pt treated self for constipation and possible hemorrhoids.  Not able to eat.

## 2016-01-02 NOTE — ED Notes (Signed)
Explained enema procedure to patient.  Commode at bedside.  Enema given.  Patient instructed to hold it in as long as possible.

## 2016-01-02 NOTE — ED Notes (Signed)
Patient notified that a urine sample is needed

## 2016-01-02 NOTE — ED Provider Notes (Signed)
[facility]-EMERGENCY DEPT Provider Note   CSN: [id number] Arrival date & time: 01/02/16  1259     History   Chief Complaint Chief Complaint  Patient presents with  . Abdominal Pain    HPI Teresa Chang is a 45 y.o. female.  HPI   45 year old female presents today with constipation. Patient reports that 3 days ago she had a small hard bowel movement. She denies any bowel movement since then, notes that she's had progressive pain in her rectum and fullness in her abdomen. Patient reports several weeks ago she had similar presentation that resolved on its own. She reports the pain is progressively getting worse, attempted laxatives at home without improvement in her symptoms. Patient reports that last night she had an episode of vomiting with what appeared to be a small amount of red material.  She denies any nausea or recurrence of symptoms. She denies any history of rectal bleeding, known hemorrhoids. Patient reports in between episodes constipation she had normal stool although it seemed concentrated. Patient denies any fever or chills, abdominal pain, weight loss, night sweats. She reports poor diet very low fiber intake, with low water intake.   Past Medical History:  Diagnosis Date  . Anxiety   . Bipolar disorder (HCC)   . Depression   . Hypertension     Patient Active Problem List   Diagnosis Date Noted  . Health care maintenance 06/18/2013  . Bipolar disorder current episode depressed (HCC) 02/03/2012  . Major depressive disorder, recurrent episode (HCC) 12/20/2011  . Paraspinal muscle spasm 09/10/2011  . Shortness of breath 02/19/2011  . Fatigue 02/19/2011  . ANXIETY DISORDER 07/03/2009  . Essential hypertension, benign 04/04/2007  . VAGINAL DISCHARGE 04/04/2007  . OBESITY, NOS 06/09/2006  . TOBACCO DEPENDENCE 06/09/2006  . OSTEOARTHRITIS, MULTI SITES 06/09/2006    History reviewed. No pertinent surgical history.  OB History    No data available     Home  Medications    Prior to Admission medications   Medication Sig Start Date End Date Taking? Authorizing Provider  acetaminophen (TYLENOL) 500 MG chewable tablet Chew 1,000 mg by mouth every 6 (six) hours as needed for pain.   Yes Historical Provider, MD  lisinopril-hydrochlorothiazide (PRINZIDE,ZESTORETIC) 10-12.5 MG tablet TAKE ONE TABLET BY MOUTH ONCE DAILY. 12/17/15  Yes Ardith Dark, MD  fluticasone (FLONASE) 50 MCG/ACT nasal spray Place 2 sprays into both nostrils daily. Patient not taking: Reported on 01/02/2016 05/07/15   Eyvonne Mechanic, PA-C  polyethylene glycol [facility]) packet Take 17 g by mouth daily. 01/02/16   Eyvonne Mechanic, PA-C    Family History History reviewed. No pertinent family history.  Social History Social History  Substance Use Topics  . Smoking status: Former Smoker    Packs/day: 0.30  . Smokeless tobacco: Never Used  . Alcohol use No     Allergies   Review of patient's allergies indicates no known allergies.   Review of Systems Review of Systems  All other systems reviewed and are negative.    Physical Exam Updated Vital Signs BP 102/59 (BP Location: Right Arm)   Pulse 68   Temp 98.6 F (37 C) (Oral)   Resp 16   Ht 5\' 7"  (1.702 m)   Wt 88.5 kg   LMP 12/08/2015 (Approximate)   SpO2 95%   BMI 30.54 kg/m   Physical Exam  Constitutional: She is oriented to person, place, and time. She appears well-developed and well-nourished.  HENT:  Head: Normocephalic and atraumatic.  Eyes: Conjunctivae are  normal. Pupils are equal, round, and reactive to light. Right eye exhibits no discharge. Left eye exhibits no discharge. No scleral icterus.  Neck: Normal range of motion. No JVD present. No tracheal deviation present.  Pulmonary/Chest: Effort normal. No stridor.  Abdominal: Soft. She exhibits no distension and no mass. There is no tenderness. There is no rebound and no guarding. No hernia.  Genitourinary:  Genitourinary Comments: Internal hemorrhoids  noted, no blood. Unable to feel impaction or stool burden   Neurological: She is alert and oriented to person, place, and time. Coordination normal.  Psychiatric: She has a normal mood and affect. Her behavior is normal. Judgment and thought content normal.  Nursing note and vitals reviewed.    ED Treatments / Results  Labs (all labs ordered are listed, but only abnormal results are displayed) Labs Reviewed  COMPREHENSIVE METABOLIC PANEL - Abnormal; Notable for the following:       Result Value   Potassium 3.4 (*)    Glucose, Bld 144 (*)    Creatinine, Ser 1.21 (*)    GFR calc non Af Amer 53 (*)    All other components within normal limits  CBC - Abnormal; Notable for the following:    Hemoglobin 11.6 (*)    HCT 35.4 (*)    MCV 77.0 (*)    MCH 25.2 (*)    All other components within normal limits  LIPASE, BLOOD  URINALYSIS, ROUTINE W REFLEX MICROSCOPIC (NOT AT [facility])  I-STAT BETA HCG BLOOD, ED ([facility], [facility], [facility] ONLY)    EKG  EKG Interpretation None       Radiology No results found.  Procedures Procedures (including critical care time)  Medications Ordered in ED Medications  sodium phosphate (FLEET) 7-19 GM/118ML enema 1 enema (1 enema Rectal Given 01/02/16 1650)     Initial Impression / Assessment and Plan / ED Course  I have reviewed the triage vital signs and the nursing notes.  Pertinent labs & imaging results that were available during my care of the patient were reviewed by me and considered in my medical decision making (see chart for details).  Clinical Course     Final Clinical Impressions(s) / ED Diagnoses   Final diagnoses:  Constipation, unspecified constipation type     Labs:  Imaging:  Consults:  Therapeutics:  Discharge Meds:   Assessment/Plan:    45 year old female presents today with likely constipation. She was given an enema here after I was unable to find any significant impaction. She reports bowel movement after the enema which  seemed to improve her symptoms. Patient reports she still feels some burning in her rectum but is improved. Patient has no abdominal pain, no nausea or vomiting tolerating by mouth. Patient has reassuring laboratory analysis, afebrile and nontoxic. She will be discharged home with MiraLAX, close primary care follow-up. Patient is instructed return immediately to the emergency room if she sentences any new or worsening signs or symptoms, or symptoms do not resolve in several days. Patient verbalized her understanding and agreement to today's plan had no further questions or concerns at time of discharge.    New Prescriptions Discharge Medication List as of 01/02/2016  5:44 PM    START taking these medications   Details  polyethylene glycol (MIRALAX) packet Take 17 g by mouth daily., Starting Fri 01/02/2016, Print         Eyvonne MechanicJeffrey Kinslee Dalpe, PA-C 01/02/16 1755    Canary Brimhristopher J Tegeler, MD 01/03/16 [phone]

## 2016-01-02 NOTE — Discharge Instructions (Signed)
Please read attached information. If you experience any new or worsening signs or symptoms please return to the emergency room for evaluation. Please follow-up with your primary care provider or specialist as discussed. Please use medication prescribed only as directed and discontinue taking if you have any concerning signs or symptoms.   °

## 2016-01-09 ENCOUNTER — Encounter: Payer: Self-pay | Admitting: Family Medicine

## 2016-03-17 ENCOUNTER — Telehealth: Payer: Self-pay | Admitting: Family Medicine

## 2016-03-17 NOTE — Telephone Encounter (Signed)
Will forward to MD to write this note, but patient will need an appointment to discuss BP since she hasn't been seen in the clinic in over a year.  Jazmin Hartsell,CMA

## 2016-03-17 NOTE — Telephone Encounter (Signed)
Pt called and would like the doctor to write a note stating that she is on BP medication and one of the side effects is using the bathroom a lot. She needs this ASAP for her employer since he doesn't like her using the bathroom so many times a day. jw

## 2016-03-18 ENCOUNTER — Telehealth: Payer: Self-pay | Admitting: Family Medicine

## 2016-03-18 NOTE — Telephone Encounter (Signed)
I will document this in the telephone note from yesterday for this concern. Jazmin Hartsell,CMA

## 2016-03-18 NOTE — Telephone Encounter (Signed)
From today's encounter: Patient came to office needing letter from doctor stating she needs extra time in going to restroom at work. Patient  Stated employer [employer] manages breaks. Please mail this letter to patient's address.  Will forward to MD. Teresa HawthorneJazmin Hartsell,CMA

## 2016-03-18 NOTE — Telephone Encounter (Signed)
Patient came to office needing letter from doctor stating she needs extra time in going to restroom at work. Patient  Stated employer [employer] manages breaks. Please mail this letter to patient's address.

## 2016-03-19 ENCOUNTER — Encounter: Payer: Self-pay | Admitting: Family Medicine

## 2016-03-19 NOTE — Telephone Encounter (Signed)
Letter sent, but she needs to make an appointment soon.  Katina Degreealeb M. Jimmey RalphParker, MD [facility] Resident PGY-3 03/19/2016 8:42 AM

## 2016-03-19 NOTE — Telephone Encounter (Signed)
Left message for patient to call office. Will call patient again. Please schedule appointment if she calls back. Thanks. Maryjean Mornempestt S Roberts, CMA

## 2016-04-01 ENCOUNTER — Encounter: Payer: Self-pay | Admitting: Family Medicine

## 2016-04-08 ENCOUNTER — Ambulatory Visit: Payer: Self-pay | Admitting: Family Medicine

## 2016-05-12 DIAGNOSIS — Z961 Presence of intraocular lens: Secondary | ICD-10-CM | POA: Diagnosis not present

## 2016-05-12 DIAGNOSIS — H401112 Primary open-angle glaucoma, right eye, moderate stage: Secondary | ICD-10-CM | POA: Diagnosis not present

## 2016-05-25 ENCOUNTER — Encounter: Payer: Self-pay | Admitting: Family Medicine

## 2016-05-25 ENCOUNTER — Other Ambulatory Visit (HOSPITAL_COMMUNITY)
Admission: RE | Admit: 2016-05-25 | Discharge: 2016-05-25 | Disposition: A | Discharge status: Home / Self Care | Payer: Medicaid Other | Source: Ambulatory Visit | Attending: Family Medicine | Admitting: Family Medicine

## 2016-05-25 ENCOUNTER — Ambulatory Visit (INDEPENDENT_AMBULATORY_CARE_PROVIDER_SITE_OTHER): Payer: Medicaid Other | Admitting: Family Medicine

## 2016-05-25 VITALS — BP 120/60 | HR 101 | Temp 98.7°F | Ht 67.0 in | Wt 212.0 lb

## 2016-05-25 DIAGNOSIS — Z01419 Encounter for gynecological examination (general) (routine) without abnormal findings: Secondary | ICD-10-CM | POA: Diagnosis present

## 2016-05-25 DIAGNOSIS — R739 Hyperglycemia, unspecified: Secondary | ICD-10-CM | POA: Diagnosis not present

## 2016-05-25 DIAGNOSIS — I10 Essential (primary) hypertension: Secondary | ICD-10-CM

## 2016-05-25 DIAGNOSIS — Z1151 Encounter for screening for human papillomavirus (HPV): Secondary | ICD-10-CM | POA: Insufficient documentation

## 2016-05-25 DIAGNOSIS — Z124 Encounter for screening for malignant neoplasm of cervix: Secondary | ICD-10-CM

## 2016-05-25 DIAGNOSIS — Z0000 Encounter for general adult medical examination without abnormal findings: Secondary | ICD-10-CM | POA: Diagnosis not present

## 2016-05-25 LAB — CBC
HCT: 37 % (ref 35.0–45.0)
Hemoglobin: 11.7 g/dL (ref 11.7–15.5)
MCH: 24.8 pg — AB (ref 27.0–33.0)
MCHC: 31.6 g/dL — ABNORMAL LOW (ref 32.0–36.0)
MCV: 78.4 fL — ABNORMAL LOW (ref 80.0–100.0)
PLATELETS: 261 10*3/uL (ref 140–400)
RBC: 4.72 MIL/uL (ref 3.80–5.10)
RDW: 15 % (ref 11.0–15.0)
WBC: 6.9 10*3/uL (ref 3.8–10.8)

## 2016-05-25 LAB — COMPLETE METABOLIC PANEL WITH GFR
ALBUMIN: 3.9 g/dL (ref 3.6–5.1)
ALK PHOS: 63 U/L (ref 33–115)
ALT: 8 U/L (ref 6–29)
AST: 13 U/L (ref 10–35)
BUN: 12 mg/dL (ref 7–25)
CO2: 23 mmol/L (ref 20–31)
Calcium: 8.8 mg/dL (ref 8.6–10.2)
Chloride: 108 mmol/L (ref 98–110)
Creat: 0.99 mg/dL (ref 0.50–1.10)
GFR, EST AFRICAN AMERICAN: 78 mL/min (ref >=60)
GFR, EST NON AFRICAN AMERICAN: 68 mL/min (ref >=60)
GLUCOSE: 130 mg/dL — AB (ref 65–99)
POTASSIUM: 3.5 mmol/L (ref 3.5–5.3)
SODIUM: 141 mmol/L (ref 135–146)
Total Bilirubin: 0.3 mg/dL (ref 0.2–1.2)
Total Protein: 6.3 g/dL (ref 6.1–8.1)

## 2016-05-25 LAB — LIPID PANEL
CHOL/HDL RATIO: 4.3 ratio (ref <5.0)
Cholesterol: 176 mg/dL (ref <200)
HDL: 41 mg/dL — ABNORMAL LOW (ref >50)
LDL Cholesterol: 96 mg/dL (ref <100)
TRIGLYCERIDES: 196 mg/dL — AB (ref <150)
VLDL: 39 mg/dL — ABNORMAL HIGH (ref <30)

## 2016-05-25 LAB — POCT GLYCOSYLATED HEMOGLOBIN (HGB A1C): HEMOGLOBIN A1C: 6.1

## 2016-05-25 NOTE — Assessment & Plan Note (Signed)
Discussed healthy lifestyle choices. Patient deferred meeting with nutritionist. Pap performed today. Will check lipid panel, CMET, CBC and A1c.

## 2016-05-25 NOTE — Progress Notes (Signed)
    Subjective:  Teresa Chang is a [age] y.o. female who [facility] with a chief complaint of annual wellness visit.   HPI:  Healthcare Maintenance Patient has quit smoking over the past year and has gained some weight as a result. Denies drinking a lot of sugar sweetened beverages. Wants to start working out more. -Due for Pap and flu shot.   ROS: All systems reviewed and are negative  PMH:  The following were reviewed and entered/updated in epic: Past Medical History:  Diagnosis Date  . Anxiety   . Bipolar disorder (HCC)   . Depression   . Hypertension    Patient Active Problem List   Diagnosis Date Noted  . Health care maintenance 06/18/2013  . Bipolar disorder current episode depressed (HCC) 02/03/2012  . Major depressive disorder, recurrent episode (HCC) 12/20/2011  . Paraspinal muscle spasm 09/10/2011  . Shortness of breath 02/19/2011  . Fatigue 02/19/2011  . ANXIETY DISORDER 07/03/2009  . Essential hypertension, benign 04/04/2007  . VAGINAL DISCHARGE 04/04/2007  . OBESITY, NOS 06/09/2006  . TOBACCO DEPENDENCE 06/09/2006  . OSTEOARTHRITIS, MULTI SITES 06/09/2006   No past surgical history on file.  No family history on file.  Medications- reviewed and updated Current Outpatient Prescriptions  Medication Sig Dispense Refill  . lisinopril-hydrochlorothiazide (PRINZIDE,ZESTORETIC) 10-12.5 MG tablet TAKE ONE TABLET BY MOUTH ONCE DAILY. 90 tablet 3   No current facility-administered medications for this visit.     Allergies-reviewed and updated No Known Allergies  Social History   Social History  . Marital status: Single    Spouse name: N/A  . Number of children: N/A  . Years of education: N/A   Social History Main Topics  . Smoking status: Former Smoker    Packs/day: 0.30  . Smokeless tobacco: Never Used  . Alcohol use No  . Drug use: No  . Sexual activity: Not Asked   Other Topics Concern  . None   Social History Narrative  .  None   Objective:  Physical Exam: BP 120/60   Pulse (!) 101   Temp 98.7 F (37.1 C) (Oral)   Ht 5\' 7"  (1.702 m)   Wt 212 lb (96.2 kg)   LMP 05/11/2016 (Approximate)   SpO2 99%   BMI 33.20 kg/m   Gen: NAD, resting comfortably CV: RRR with no murmurs appreciated Pulm: NWOB, CTAB with no crackles, wheezes, or rhonchi GI: Normal bowel sounds present. Soft, Nontender, Nondistended. GU: Normal external and internal female genitalia.  MSK: no edema, cyanosis, or clubbing noted Skin: warm, dry Neuro: grossly normal, moves all extremities Psych: Normal affect and thought content  Assessment/Plan:  Health care maintenance Discussed healthy lifestyle choices. Patient deferred meeting with nutritionist. Pap performed today. Will check lipid panel, CMET, CBC and A1c.  Katina Degreealeb M. Jimmey RalphParker, MD [facility] Resident PGY-3 05/25/2016 5:14 PM

## 2016-05-25 NOTE — Patient Instructions (Signed)
It was nice seeing you today.  We will check blood work and your pap.  We will leave you a voicemail with the results.  Come back to see me in 6-12 months.  Take care,  Dr Jimmey RalphParker

## 2016-05-28 LAB — CYTOLOGY - PAP
DIAGNOSIS: NEGATIVE
HPV (WINDOPATH): DETECTED — AB

## 2016-05-31 ENCOUNTER — Telehealth: Payer: Self-pay | Admitting: Family Medicine

## 2016-05-31 MED ORDER — METRONIDAZOLE 500 MG PO TABS: 2000.0000 mg | ORAL_TABLET | Freq: Once | ORAL | 0 refills | Status: AC

## 2016-05-31 NOTE — Telephone Encounter (Signed)
Called patient to discuss lab results.  Discussed elevated A1c. Patient deferred medications at this time. Will work on diet and exercise.  Discussed normal pap. Will need repeat in 5 years.  Discussed presence of trichomonas on pap smear. Will treat with one time dose of flagyl. Advised patient to discuss with her recent sexual partners.  Patient voiced understanding and had no further questions.  Katina Degreealeb M. Jimmey RalphParker, MD [facility] Resident PGY-3 05/31/2016 3:05 PM

## 2016-11-11 ENCOUNTER — Other Ambulatory Visit: Payer: Self-pay | Admitting: *Deleted

## 2016-11-11 DIAGNOSIS — I10 Essential (primary) hypertension: Secondary | ICD-10-CM

## 2016-11-11 MED ORDER — LISINOPRIL-HYDROCHLOROTHIAZIDE 10-12.5 MG PO TABS: 1.0000 | ORAL_TABLET | Freq: Every day | ORAL | 2 refills | Status: DC

## 2016-11-11 NOTE — Telephone Encounter (Signed)
Refill request for 90 day supply.  Martin, Tamika L, RN  

## 2016-11-12 ENCOUNTER — Telehealth: Payer: Self-pay | Admitting: Family Medicine

## 2016-11-12 NOTE — Telephone Encounter (Signed)
Pt was told by pharmacy they would no longer carry her lisiopril.  Please let pharmacy know what other options are available

## 2016-11-12 NOTE — Telephone Encounter (Signed)
Page,  Please would you clarify with patient what the situation is with her lisinopril. She uses [facility], I do not know why they would stop carrying such a popular BP medications.  Thank you  Kyrell Ruacho

## 2016-11-15 ENCOUNTER — Encounter: Payer: Self-pay | Admitting: Family Medicine

## 2016-11-15 ENCOUNTER — Ambulatory Visit (INDEPENDENT_AMBULATORY_CARE_PROVIDER_SITE_OTHER): Payer: Self-pay | Admitting: Family Medicine

## 2016-11-15 VITALS — BP 120/64 | HR 75 | Temp 98.0°F | Ht 67.0 in | Wt 204.0 lb

## 2016-11-15 DIAGNOSIS — I10 Essential (primary) hypertension: Secondary | ICD-10-CM

## 2016-11-15 DIAGNOSIS — E669 Obesity, unspecified: Secondary | ICD-10-CM

## 2016-11-15 DIAGNOSIS — Z6831 Body mass index (BMI) 31.0-31.9, adult: Secondary | ICD-10-CM

## 2016-11-15 LAB — POCT GLYCOSYLATED HEMOGLOBIN (HGB A1C): HEMOGLOBIN A1C: 5.9

## 2016-11-15 MED ORDER — LISINOPRIL-HYDROCHLOROTHIAZIDE 10-12.5 MG PO TABS: 1.0000 | ORAL_TABLET | Freq: Every day | ORAL | 11 refills | Status: DC

## 2016-11-15 NOTE — Patient Instructions (Signed)
It was great seeing you today! We have addressed the following issues today  1. I will check on your thyroid function and A1c ( marker for Diabetes) will discuss results at our next visit. 2. Keep going with your exercise plan and diet, you have lost 8 lbs so far and should be proud of that.  If we did any lab work today, and the results require attention, either me or my nurse will get in touch with you. If everything is normal, you will get a letter in mail and a message via . If you don't hear from us in two weeks, please give us a call. Otherwise, we look forward to seeing you again at your next visit. If you have any questions or concerns before then, please call the clinic at [phone].  Please bring all your medications to every doctors visit  Sign up for My Chart to have easy access to your labs results, and communication with your Primary care physician. Please ask Front Desk for some assistance.   Please check-out at the front desk before leaving the clinic.    Take Care,   Dr. Sydnee Cabaliallo

## 2016-11-15 NOTE — Telephone Encounter (Signed)
I have attempted to contact pt, no answer. Pt has an apt today with PCP. I will address at apt this afternoon.

## 2016-11-15 NOTE — Progress Notes (Signed)
   Subjective:    Patient ID: Teresa MeierLaurie A Crayton, female    DOB: 10-03-1970, 46 y.o.   MRN: [id number]   CC: Weight gain   HPI: Patient is here to discuss weight gain. Patient reports that she has been very unhappy with her weight  And has gained 30-40 lbs few months. She does not understand why she is not losing the weight because she has been working out and made some significant changes to her diet. Patient reports some ankle  swelling at times and minimal hair loss but otherwise denies any shortness of breath, chest pain, abdominal pain, headache and vomiting. Patient currently only take BP meds for her hypertension.  Smoking status reviewed   ROS: all other systems were reviewed and are negative other than in the HPI   Past Medical History:  Diagnosis Date  . Anxiety   . Bipolar disorder (HCC)   . Depression   . Hypertension     No past surgical history on file.  Past medical history, surgical, family, and social history reviewed and updated in the EMR as appropriate.  Objective:  BP 120/64   Pulse 75   Temp 98 F (36.7 C)   Ht 5\' 7"  (1.702 m)   Wt 204 lb (92.5 kg)   LMP 11/15/2016   SpO2 99%   BMI 31.95 kg/m   Vitals and nursing note reviewed  General: NAD, pleasant, able to participate in exam Cardiac: RRR, normal heart sounds, no murmurs. 2+ radial and PT pulses bilaterally Respiratory: CTAB, normal effort, No wheezes, rales or rhonchi Abdomen: soft, nontender, nondistended, no hepatic or splenomegaly, +BS Extremities: no edema or cyanosis. WWP. Skin: warm and dry, no rashes noted Neuro: alert and oriented x4, no focal deficits Psych: Normal affect and mood   Assessment & Plan:   #Weight gain, chronic  Patient has gained about 40 lbs in the past two years with her lowest recorded weight being around 125-180 lbs and her highest 212 lbs. Patient has made new lifestyle changes to curb weight gain and improve her health ans well being. Since last clinic in  February, patient  Has lost 8 lbs. Patient was encouraged to keep going and she understand that there is no quick fix. AS she get older it takes more time and effort to lose the weight and keep it down. Will rule out other possible contributor to weight gain such as hypothyroidism, will also check on patient A1c.  --Order TSH and A1c --Continue with TLC  --Follow up in Clinic in 3 months or sooner if needed --Continue with BP control meds   Lovena NeighboursAbdoulaye Tajai Ihde, MD [facility] PGY-2

## 2016-11-16 ENCOUNTER — Telehealth: Payer: Self-pay | Admitting: Family Medicine

## 2016-11-16 LAB — TSH: TSH: 0.704 u[IU]/mL (ref 0.450–4.500)

## 2016-11-16 NOTE — Telephone Encounter (Signed)
Will forward to Dr. Sydnee Cabaliallo. Keysi Oelkers,CMA

## 2016-11-16 NOTE — Telephone Encounter (Signed)
Would like results from yesterday  °

## 2016-11-17 NOTE — Telephone Encounter (Signed)
Jazmin,  Please could your let patient know that both her A1c (5.9) and TSH are both within normal limits.  Thank you  Lovena NeighboursAbdoulaye Dwana Garin, MD [facility], PGY-2

## 2016-11-17 NOTE — Telephone Encounter (Signed)
Patient was informed of lab results and states that she will keep going to the gym and watching what she is eating. Jazmin Hartsell,CMA

## 2016-11-26 ENCOUNTER — Telehealth: Payer: Self-pay | Admitting: *Deleted

## 2016-11-26 NOTE — Telephone Encounter (Signed)
Patient requesting letter from MD for her job that states she needs to be able to use the bathroom. Patient states her BP medication causes her to go more frequently and her job needs a letter from MD before they will accommodate her. Patient would like to pick up letter on Monday.

## 2016-11-27 ENCOUNTER — Encounter: Payer: Self-pay | Admitting: Family Medicine

## 2016-11-27 NOTE — Telephone Encounter (Signed)
Teresa Chang,  I wrote a letter for the patient as requested, I forwarded the letter to you. Please inform patient that it is ready for pick up.  Thank you  Lovena Neighbours, MD [facility], PGY-2

## 2016-11-29 ENCOUNTER — Other Ambulatory Visit: Payer: Self-pay | Admitting: *Deleted

## 2016-11-29 DIAGNOSIS — I10 Essential (primary) hypertension: Secondary | ICD-10-CM

## 2016-11-29 MED ORDER — LISINOPRIL-HYDROCHLOROTHIAZIDE 10-12.5 MG PO TABS: 1.0000 | ORAL_TABLET | Freq: Every day | ORAL | 11 refills | Status: DC

## 2016-11-29 NOTE — Telephone Encounter (Signed)
Patient informed that letter is ready for pickup. Patient also requested to change pharmacies from [facility] to [facility].  Clovis Pu, RN

## 2017-01-13 ENCOUNTER — Other Ambulatory Visit (HOSPITAL_COMMUNITY)
Admission: RE | Admit: 2017-01-13 | Discharge: 2017-01-13 | Disposition: A | Discharge status: Home / Self Care | Payer: Medicaid Other | Source: Ambulatory Visit | Attending: Family Medicine | Admitting: Family Medicine

## 2017-01-13 ENCOUNTER — Ambulatory Visit (INDEPENDENT_AMBULATORY_CARE_PROVIDER_SITE_OTHER): Payer: Self-pay | Admitting: Internal Medicine

## 2017-01-13 ENCOUNTER — Encounter: Payer: Self-pay | Admitting: Internal Medicine

## 2017-01-13 VITALS — BP 110/75 | HR 74 | Temp 98.3°F | Ht 68.0 in | Wt 211.8 lb

## 2017-01-13 DIAGNOSIS — Z113 Encounter for screening for infections with a predominantly sexual mode of transmission: Secondary | ICD-10-CM | POA: Insufficient documentation

## 2017-01-13 DIAGNOSIS — R718 Other abnormality of red blood cells: Secondary | ICD-10-CM

## 2017-01-13 DIAGNOSIS — N760 Acute vaginitis: Secondary | ICD-10-CM

## 2017-01-13 DIAGNOSIS — B9689 Other specified bacterial agents as the cause of diseases classified elsewhere: Secondary | ICD-10-CM

## 2017-01-13 DIAGNOSIS — E559 Vitamin D deficiency, unspecified: Secondary | ICD-10-CM

## 2017-01-13 DIAGNOSIS — I10 Essential (primary) hypertension: Secondary | ICD-10-CM

## 2017-01-13 LAB — POCT WET PREP (WET MOUNT)
Clue Cells Wet Prep Whiff POC: NEGATIVE
Trichomonas Wet Prep HPF POC: ABSENT

## 2017-01-13 MED ORDER — METRONIDAZOLE 500 MG PO TABS: 500.0000 mg | ORAL_TABLET | Freq: Two times a day (BID) | ORAL | 0 refills | Status: DC

## 2017-01-13 NOTE — Progress Notes (Signed)
   [facility] Phone: [phone]   Date of Visit: 01/13/2017   HPI:  Vaginal Discharge:  - reports of 1 week history of white thick discharge with odor - no suprapubic or pelvic pain - is currently on her period - is sexually active and has a new partner. She did not use condoms this past time.  - she does have a history of STI; she does not remember what. Per chart review, trichomonas in February  - denies dysuria or urinary frequency  Ankle Swelling:  - patient noticed some ankle edema since stopping her blood pressure medication about 1 month ago  - no calf pain,no prolonged bed rest, no recent long travels  - has not noticed if swelling improves with elevation of legs   HTN: - reports that she has not been taking Lisinopril-HCTZ for about a month  - she is wondering if she needs to restart this medication since her BP is normal today.  - she does not exercise regularly  - she does not follow a heart healthy diet - no chest pain, HA, blurred vision, orthopnea, shortness of breath    ROS: See HPI.  PMFSH:  PMH: Anxiety  Bipolar DO  Obesity  OA HTN   PHYSICAL EXAM: BP 110/75 (BP Location: Right Arm, Patient Position: Sitting, Cuff Size: Normal)   Pulse 74   Temp 98.3 F (36.8 C) (Oral)   Ht  (1.727 m)   Wt 211 lb 12.8 oz (96.1 kg)   LMP 01/13/2017   SpO2 97%   BMI 32.20 kg/m  Gen: NAD, non-toxic appearing Psych: normal speech, mood and affect is appropriate Gu: Female genitalia: normal external genitalia, vulva, vagina, cervix, uterus and adnexa. No cervical motion tenderness. Currently on her period LE: no significant pitting edema noted  ASSESSMENT/PLAN:  Health Maintenance:  - declined flu vaccine  Vaginal Discharge: wet prep with few clue cells. Will treat for BV with Flagyl  BID x 7 days. Counseled patient on medication.  - Gc/Chlamydia - HIV, RPR   Essential hypertension, benign Due to normal blood pressure  today after not being on medication for 1 month, we discussed not restarting her BP medication. Follow up with Dr. Sydnee Chang in about 4 weeks for recheck .   Ankle Swelling: bilateral, exam is unremarkable today. No signs/symptoms of DVT. Possibly anemia related as her MCV is low. Recent TSH was normal. Unlikely heart failure. Normal kidney and liver function from labs from 05/2016.  - ferritin, iron, TIBC - discussed following a heart healthy diet and regular exercise and to monitor symptoms  Vitamin D Deficiency: history of deficiency with last level 15 in 2012 - vitamin D level    Teresa Holter, MD PGY 3 [facility]

## 2017-01-13 NOTE — Assessment & Plan Note (Signed)
Due to normal blood pressure today after not being on medication for 1 month, we discussed not restarting her BP medication. Follow up with Dr. Sydnee Cabal in about 4 weeks for recheck .

## 2017-01-13 NOTE — Patient Instructions (Addendum)
I would not restart your blood pressure medication because your blood pressure is good.  I would continue to eat healthy and low salt diet Make sure you exercise regularly   I will call you with the lab results

## 2017-01-14 LAB — SPECIMEN STATUS

## 2017-01-14 LAB — CERVICOVAGINAL ANCILLARY ONLY
CHLAMYDIA, DNA PROBE: NEGATIVE
NEISSERIA GONORRHEA: NEGATIVE

## 2017-01-14 LAB — VITAMIN D 25 HYDROXY (VIT D DEFICIENCY, FRACTURES): VIT D 25 HYDROXY: 15.6 ng/mL — AB (ref 30.0–100.0)

## 2017-01-14 LAB — FERRITIN
Ferritin: 16 ng/mL (ref 15–150)
Ferritin: 18 ng/mL (ref 15–150)

## 2017-01-14 LAB — IRON AND TIBC
Iron Saturation: 15 % (ref 15–55)
Iron: 46 ug/dL (ref 27–159)
Total Iron Binding Capacity: 316 ug/dL (ref 250–450)
UIBC: 270 ug/dL (ref 131–425)

## 2017-01-14 LAB — HIV ANTIBODY (ROUTINE TESTING W REFLEX): HIV SCREEN 4TH GENERATION: NONREACTIVE

## 2017-01-14 LAB — RPR: RPR: NONREACTIVE

## 2017-01-20 ENCOUNTER — Telehealth: Payer: Self-pay | Admitting: Internal Medicine

## 2017-01-20 MED ORDER — VITAMIN D 1000 UNITS PO TABS: 1000.0000 [IU] | ORAL_TABLET | Freq: Every day | ORAL | 2 refills | Status: DC

## 2017-01-20 MED ORDER — FERROUS SULFATE 325 (65 FE) MG PO TABS: 325.0000 mg | ORAL_TABLET | Freq: Every day | ORAL | 3 refills | Status: DC

## 2017-01-20 NOTE — Telephone Encounter (Signed)
Called patient to discus lab results.  Will start Vitamin D 1000IU daily for Vit D deficiency. Iron studies consistent with iron deficiency. Start Ferrous sulfate 325 daily.  Follow up with PCP

## 2017-09-19 ENCOUNTER — Ambulatory Visit: Payer: Medicaid Other | Admitting: Internal Medicine

## 2017-09-19 ENCOUNTER — Telehealth (HOSPITAL_COMMUNITY): Payer: Self-pay | Admitting: Emergency Medicine

## 2017-09-19 ENCOUNTER — Ambulatory Visit (HOSPITAL_COMMUNITY)
Admission: EM | Admit: 2017-09-19 | Discharge: 2017-09-19 | Disposition: A | Discharge status: Home / Self Care | Payer: Medicaid Other | Attending: Family Medicine | Admitting: Family Medicine

## 2017-09-19 ENCOUNTER — Encounter (HOSPITAL_COMMUNITY): Payer: Self-pay | Admitting: Emergency Medicine

## 2017-09-19 DIAGNOSIS — R2243 Localized swelling, mass and lump, lower limb, bilateral: Secondary | ICD-10-CM

## 2017-09-19 DIAGNOSIS — R600 Localized edema: Secondary | ICD-10-CM

## 2017-09-19 MED ORDER — HYDROCHLOROTHIAZIDE 25 MG PO TABS: 25.0000 mg | ORAL_TABLET | Freq: Every day | ORAL | 0 refills | Status: DC

## 2017-09-19 NOTE — ED Provider Notes (Signed)
[facility]   [id number] 09/19/17 Arrival Time: 1645  SUBJECTIVE: History from: family. Teresa Chang is a 47 y.o. female hx significant for HTN complains of bilateral LE swelling that began 1 month ago.  Began after working 12 hr shifts approximately one month ago, but states she is no longer working 12 hr shifts.  Localizes the swelling to lower extremities  Describes the swelling as constant.  Has tried soaking and elevation without relief.  Symptoms are made worse with being on feet all day.  Denies similar symptoms in the past.  Denies fever, chills, chest pain, SOB, HA, vision changes, erythema, ecchymosis, effusion, weakness, numbness and tingling.      ROS: As per HPI.  Past Medical History:  Diagnosis Date  . Anxiety   . Bipolar disorder (HCC)   . Depression   . Hypertension    History reviewed. No pertinent surgical history. No Known Allergies No current facility-administered medications on file prior to encounter.    Current Outpatient Medications on File Prior to Encounter  Medication Sig Dispense Refill  . cholecalciferol (VITAMIN D) 1000 units tablet Take 1 tablet (1,000 Units total) by mouth daily. 30 tablet 2  . ferrous sulfate 325 (65 FE) MG tablet Take 1 tablet (325 mg total) by mouth daily. 30 tablet 3  . metroNIDAZOLE (FLAGYL) 500 MG tablet Take 1 tablet (500 mg total) by mouth 2 (two) times daily. (Patient not taking: Reported on 09/19/2017) 14 tablet 0   Social History   Socioeconomic History  . Marital status: Single    Spouse name: Not on file  . Number of children: Not on file  . Years of education: Not on file  . Highest education level: Not on file  Occupational History  . Not on file  Social Needs  . Financial resource strain: Not on file  . Food insecurity:    Worry: Not on file    Inability: Not on file  . Transportation needs:    Medical: Not on file    Non-medical: Not on file  Tobacco Use  . Smoking status: Former Smoker   Packs/day: 0.30  . Smokeless tobacco: Never Used  Substance and Sexual Activity  . Alcohol use: No  . Drug use: No  . Sexual activity: Not on file  Lifestyle  . Physical activity:    Days per week: Not on file    Minutes per session: Not on file  . Stress: Not on file  Relationships  . Social connections:    Talks on phone: Not on file    Gets together: Not on file    Attends religious service: Not on file    Active member of club or organization: Not on file    Attends meetings of clubs or organizations: Not on file    Relationship status: Not on file  . Intimate partner violence:    Fear of current or ex partner: Not on file    Emotionally abused: Not on file    Physically abused: Not on file    Forced sexual activity: Not on file  Other Topics Concern  . Not on file  Social History Narrative  . Not on file   No family history on file.  OBJECTIVE:  Vitals:   09/19/17 1700  BP: (!) 142/89  Pulse: 88  Resp: 18  Temp: 98.5 F (36.9 C)  SpO2: 99%    General appearance: AOx3; in no acute distress.  Head: NCAT Lungs: CTA bilaterally Heart: RRR.  Clear S1 and S2 without murmur, gallops, or rubs.  Radial pulses 2+ bilaterally.  1-2+ pitting edema bilateral LE that extend to mid tibia Skin: warm and dry Neurologic: Ambulates without difficulty; Sensation intact Psychological: alert and cooperative; normal mood and affect  ASSESSMENT & PLAN:  1. Bilateral lower extremity edema     Meds ordered this encounter  Medications  . DISCONTD: hydrochlorothiazide (HYDRODIURIL) 25 MG tablet    Sig: Take 1 tablet (25 mg total) by mouth daily for 10 days.    Dispense:  10 tablet    Refill:  0    Order Specific Question:   Supervising Provider    Answer:   Isa RankinMURRAY, LAURA WILSON [[id number]]   Thiazide diuretic prescribed.   Recommend low salt diet Follow up with PCP for further evaluation and management Return or go to the ER if you have any new or worsening  symptoms  Reviewed expectations re: course of current medical issues. Questions answered. Outlined signs and symptoms indicating need for more acute intervention. Patient verbalized understanding. After Visit Summary given.    Rennis HardingWurst, Ewell Benassi, PA-C 09/19/17 1813

## 2017-09-19 NOTE — ED Triage Notes (Signed)
Pt states shes had swollen feet x1 month.

## 2017-09-19 NOTE — Discharge Instructions (Signed)
Thiazide diuretic prescribed.   Recommend low salt diet Follow up with PCP for further evaluation and management Return or go to the ER if you have any new or worsening symptoms

## 2017-10-12 ENCOUNTER — Ambulatory Visit (INDEPENDENT_AMBULATORY_CARE_PROVIDER_SITE_OTHER): Payer: Self-pay | Admitting: Family Medicine

## 2017-10-12 ENCOUNTER — Encounter: Payer: Self-pay | Admitting: Family Medicine

## 2017-10-12 VITALS — BP 110/72 | HR 73 | Temp 98.5°F | Ht 68.0 in | Wt 224.8 lb

## 2017-10-12 DIAGNOSIS — M7989 Other specified soft tissue disorders: Secondary | ICD-10-CM | POA: Insufficient documentation

## 2017-10-12 DIAGNOSIS — R0602 Shortness of breath: Secondary | ICD-10-CM

## 2017-10-12 MED ORDER — HYDROCHLOROTHIAZIDE 25 MG PO TABS: 25.0000 mg | ORAL_TABLET | Freq: Every day | ORAL | 0 refills | Status: DC

## 2017-10-12 NOTE — Progress Notes (Signed)
   Subjective:    Patient ID: Roselyn MeierLaurie A Cartwright, female    DOB: Sep 24, 1970, 47 y.o.   MRN: [id number]   CC: Bilateral lower extremity swelling  HPI: Patient is a 47 year old female with a past medical history significant for hypertension, vitamin D deficiency and anemia who presents today complaining of bilateral lower extremity edema.  Patient reports her symptoms started about 2 months ago, when she noted increased weight and lower extremity edema.  Patient stated edema acute worsening with weight gain.  Patient was scheduled to see me in clinic but was unable to make it and she subsequently missed her make appointment.  Patient was then seen at urgent care and was prescribed HCTZ 25 mg for her lower extremity edema and asked to follow-up with PCP.  Patient finished her 10-day course of HCTZ and reports significant improvement in lower extremity swelling.  Patient reports that she has been avoiding high salt diet.  She denies any shortness of breath.  Blood pressure has been well controlled.  Patient is here today to discuss cause for lower extremity edema and treatment options. Smoking status reviewed   ROS: all other systems were reviewed and are negative other than in the HPI   Past Medical History:  Diagnosis Date  . Anxiety   . Bipolar disorder (HCC)   . Depression   . Hypertension     History reviewed. No pertinent surgical history.  Past medical history, surgical, family, and social history reviewed and updated in the EMR as appropriate.  Objective:  BP 110/72 (BP Location: Left Arm, Patient Position: Sitting, Cuff Size: Normal)   Pulse 73   Temp 98.5 F (36.9 C) (Oral)   Ht 5\' 8"  (1.727 m)   Wt 224 lb 12.8 oz (102 kg)   SpO2 100%   BMI 34.18 kg/m   Vitals and nursing note reviewed  General: NAD, pleasant, able to participate in exam Cardiac: RRR, normal heart sounds, no murmurs. 2+ radial and PT pulses bilaterally Respiratory: CTAB, normal effort, No wheezes, rales or  rhonchi Abdomen: soft, nontender, nondistended, no hepatic or splenomegaly, +BS Extremities: 1+ pitting edema bilaterally up to mid thigh. Skin: warm and dry, no rashes noted Neuro: alert and oriented x4, no focal deficits Psych: Normal affect and mood   Assessment & Plan:    Swelling of lower extremity Patient is a 47 year old female presenting with bilateral lower extremities edema worsening the past 2 months which responded well to diuretics.  Patient has no history of cardiac pathology.  Last BMP patient had normal creatinine.  No evidence of liver failure.  Patient used to have uncontrolled hypertension which has been fairly well-controlled the past 3 months.  Given age venous insufficiency is unlikely but still possible.  He will continue lower extremity edema despite HCTZ, further work-up is warranted. --We will order CMP, CBC, TSH, and BNP. --We will follow-up on results --If all normal will order echo cardiogram --Based on echocardiogram will decide if patient needs referral to cardiology for further evaluation --Refill HCTZ 25 mg 10 tabs to help with lower extremity edema given good response in the past.    Lovena NeighboursAbdoulaye Usher Hedberg, MD [facility] PGY-3

## 2017-10-12 NOTE — Assessment & Plan Note (Signed)
Patient is a 47 year old female presenting with bilateral lower extremities edema worsening the past 2 months which responded well to diuretics.  Patient has no history of cardiac pathology.  Last BMP patient had normal creatinine.  No evidence of liver failure.  Patient used to have uncontrolled hypertension which has been fairly well-controlled the past 3 months.  Given age venous insufficiency is unlikely but still possible.  He will continue lower extremity edema despite HCTZ, further work-up is warranted. --We will order CMP, CBC, TSH, and BNP. --We will follow-up on results --If all normal will order echo cardiogram --Based on echocardiogram will decide if patient needs referral to cardiology for further evaluation --Refill HCTZ 25 mg 10 tabs to help with lower extremity edema given good response in the past.

## 2017-10-12 NOTE — Patient Instructions (Signed)
It was great seeing you today! We have addressed the following issues today  1. We will do some blood work and I will follow up on the results. 2. I refilled your prescription and I want to see you in 2 weeks.   If we did any lab work today, and the results require attention, either me or my nurse will get in touch with you. If everything is normal, you will get a letter in mail and a message via . If you don't hear from us in two weeks, please give us a call. Otherwise, we look forward to seeing you again at your next visit. If you have any questions or concerns before then, please call the clinic at [phone].  Please bring all your medications to every doctors visit  Sign up for My Chart to have easy access to your labs results, and communication with your Primary care physician. Please ask Front Desk for some assistance.   Please check-out at the front desk before leaving the clinic.    Take Care,   Dr. Sydnee Cabaliallo

## 2017-10-12 NOTE — Progress Notes (Signed)
CM

## 2017-10-13 LAB — COMPREHENSIVE METABOLIC PANEL
ALK PHOS: 82 IU/L (ref 39–117)
ALT: 13 IU/L (ref 0–32)
AST: 14 IU/L (ref 0–40)
Albumin/Globulin Ratio: 1.6 (ref 1.2–2.2)
Albumin: 4.1 g/dL (ref 3.5–5.5)
BUN/Creatinine Ratio: 12 (ref 9–23)
BUN: 10 mg/dL (ref 6–24)
CHLORIDE: 104 mmol/L (ref 96–106)
CO2: 23 mmol/L (ref 20–29)
CREATININE: 0.84 mg/dL (ref 0.57–1.00)
Calcium: 9.2 mg/dL (ref 8.7–10.2)
GFR calc Af Amer: 95 mL/min/{1.73_m2} (ref >59)
GFR calc non Af Amer: 82 mL/min/{1.73_m2} (ref >59)
GLOBULIN, TOTAL: 2.5 g/dL (ref 1.5–4.5)
GLUCOSE: 105 mg/dL — AB (ref 65–99)
Potassium: 4.4 mmol/L (ref 3.5–5.2)
SODIUM: 139 mmol/L (ref 134–144)
Total Protein: 6.6 g/dL (ref 6.0–8.5)

## 2017-10-13 LAB — CBC WITH DIFFERENTIAL/PLATELET
BASOS ABS: 0.1 10*3/uL (ref 0.0–0.2)
Basos: 1 %
EOS (ABSOLUTE): 0.2 10*3/uL (ref 0.0–0.4)
Eos: 2 %
Hematocrit: 36.2 % (ref 34.0–46.6)
Hemoglobin: 11.4 g/dL (ref 11.1–15.9)
IMMATURE GRANS (ABS): 0 10*3/uL (ref 0.0–0.1)
IMMATURE GRANULOCYTES: 0 %
LYMPHS: 33 %
Lymphocytes Absolute: 2 10*3/uL (ref 0.7–3.1)
MCH: 25.1 pg — ABNORMAL LOW (ref 26.6–33.0)
MCHC: 31.5 g/dL (ref 31.5–35.7)
MCV: 80 fL (ref 79–97)
Monocytes Absolute: 0.6 10*3/uL (ref 0.1–0.9)
Monocytes: 10 %
NEUTROS PCT: 54 %
Neutrophils Absolute: 3.3 10*3/uL (ref 1.4–7.0)
PLATELETS: 265 10*3/uL (ref 150–450)
RBC: 4.55 x10E6/uL (ref 3.77–5.28)
RDW: 15.8 % — AB (ref 12.3–15.4)
WBC: 6.1 10*3/uL (ref 3.4–10.8)

## 2017-10-13 LAB — BRAIN NATRIURETIC PEPTIDE: BNP: 22.3 pg/mL (ref 0.0–100.0)

## 2017-10-13 LAB — TSH: TSH: 0.677 u[IU]/mL (ref 0.450–4.500)

## 2017-10-20 ENCOUNTER — Other Ambulatory Visit: Payer: Self-pay | Admitting: Family Medicine

## 2017-10-20 ENCOUNTER — Telehealth: Payer: Self-pay | Admitting: *Deleted

## 2017-10-20 DIAGNOSIS — M7989 Other specified soft tissue disorders: Secondary | ICD-10-CM

## 2017-10-20 NOTE — Telephone Encounter (Signed)
Pt informed of the above.  She is ok with a 2D echo, she will need it scheduled as late in the afternoon as possible.  Will forward to MD to place.  She is taking the HCTZ daily and would like a refill because it is painful when her feet swell. Kayci Belleville, Maryjo RochesterJessica Dawn, CMA

## 2017-10-20 NOTE — Telephone Encounter (Signed)
Please let patient know that her results were normal. I will order an echocardiogram to take a closer look at her heart. Based on the results will decide if she needs to see a cardiologist. For the HCTZ she should only take it as needed for swelling. If she is taking it everyday, I will send another script for her.   Thanks

## 2017-10-20 NOTE — Telephone Encounter (Signed)
Pt calls to check the status of her lab resutls.  She also needs a refill on HCTZ if she is to continue.  States that her swelling is much better since taking HCTZ.  Fleeger, Maryjo RochesterJessica Dawn, CMA

## 2017-10-22 NOTE — Telephone Encounter (Signed)
HCTZ was refilled and ECHO is scheduled. Patient is aware of date and time.  Thanks   Lovena NeighboursAbdoulaye Shawnee Higham, MD [facility], PGY-3

## 2017-10-25 ENCOUNTER — Ambulatory Visit (HOSPITAL_COMMUNITY)
Admission: RE | Admit: 2017-10-25 | Discharge: 2017-10-25 | Disposition: A | Discharge status: Home / Self Care | Payer: Medicaid Other | Source: Ambulatory Visit | Attending: Family Medicine | Admitting: Family Medicine

## 2017-10-25 DIAGNOSIS — M7989 Other specified soft tissue disorders: Secondary | ICD-10-CM

## 2017-10-25 DIAGNOSIS — I340 Nonrheumatic mitral (valve) insufficiency: Secondary | ICD-10-CM | POA: Insufficient documentation

## 2017-10-25 DIAGNOSIS — F319 Bipolar disorder, unspecified: Secondary | ICD-10-CM | POA: Insufficient documentation

## 2017-10-25 DIAGNOSIS — F419 Anxiety disorder, unspecified: Secondary | ICD-10-CM | POA: Insufficient documentation

## 2017-10-25 DIAGNOSIS — I119 Hypertensive heart disease without heart failure: Secondary | ICD-10-CM | POA: Insufficient documentation

## 2017-10-25 MED ORDER — IOPAMIDOL (ISOVUE-370) INJECTION 76%
INTRAVENOUS | Status: AC
  Filled 2017-10-25: qty 100

## 2017-10-25 NOTE — Progress Notes (Signed)
  Echocardiogram 2D Echocardiogram has been performed.  Teresa Chang, Teresa Chang F 10/25/2017, 9:17 AM

## 2017-10-26 ENCOUNTER — Telehealth: Payer: Self-pay

## 2017-10-26 ENCOUNTER — Other Ambulatory Visit: Payer: Self-pay | Admitting: Family Medicine

## 2017-10-26 DIAGNOSIS — I5031 Acute diastolic (congestive) heart failure: Secondary | ICD-10-CM

## 2017-10-26 MED ORDER — HYDROCHLOROTHIAZIDE 25 MG PO TABS: 25.0000 mg | ORAL_TABLET | Freq: Every day | ORAL | 0 refills | Status: DC

## 2017-10-26 NOTE — Telephone Encounter (Signed)
Pt called nurse line requesting report of Echo. Pt states she is out of her hydrochlorothiazide and her feet are starting to swell again. Pt wants to know if she can get a refill on this or what's the next step in plan of care.

## 2017-10-26 NOTE — Telephone Encounter (Signed)
Talked and discussed with patient results from ECHO. Referral to cardiology made. Will follow up in clinic after her appointment.  Thanks  Lovena NeighboursAbdoulaye Ebany Bowermaster, MD [facility], PGY-3

## 2017-11-18 ENCOUNTER — Encounter: Payer: Self-pay | Admitting: Cardiology

## 2017-11-18 ENCOUNTER — Ambulatory Visit (INDEPENDENT_AMBULATORY_CARE_PROVIDER_SITE_OTHER): Payer: Self-pay | Admitting: Cardiology

## 2017-11-18 VITALS — BP 124/80 | HR 80 | Ht 68.0 in | Wt 221.0 lb

## 2017-11-18 DIAGNOSIS — R600 Localized edema: Secondary | ICD-10-CM

## 2017-11-18 DIAGNOSIS — I5032 Chronic diastolic (congestive) heart failure: Secondary | ICD-10-CM

## 2017-11-18 DIAGNOSIS — I10 Essential (primary) hypertension: Secondary | ICD-10-CM

## 2017-11-18 DIAGNOSIS — R609 Edema, unspecified: Secondary | ICD-10-CM

## 2017-11-18 DIAGNOSIS — R635 Abnormal weight gain: Secondary | ICD-10-CM

## 2017-11-18 LAB — BASIC METABOLIC PANEL
BUN/Creatinine Ratio: 12 (ref 9–23)
BUN: 12 mg/dL (ref 6–24)
CALCIUM: 9.4 mg/dL (ref 8.7–10.2)
CHLORIDE: 97 mmol/L (ref 96–106)
CO2: 16 mmol/L — AB (ref 20–29)
Creatinine, Ser: 0.98 mg/dL (ref 0.57–1.00)
GFR calc non Af Amer: 68 mL/min/{1.73_m2} (ref >59)
GFR, EST AFRICAN AMERICAN: 79 mL/min/{1.73_m2} (ref >59)
GLUCOSE: 255 mg/dL — AB (ref 65–99)
Potassium: 4.3 mmol/L (ref 3.5–5.2)
Sodium: 134 mmol/L (ref 134–144)

## 2017-11-18 MED ORDER — HYDROCHLOROTHIAZIDE 25 MG PO TABS: 25.0000 mg | ORAL_TABLET | Freq: Every day | ORAL | 3 refills | Status: DC

## 2017-11-18 NOTE — Patient Instructions (Signed)
Medication Instructions:  Your physician recommends that you continue on your current medications as directed. Please refer to the Current Medication list given to you today.  Your HCTZ has been refilled today  Labwork: Bmet today  Testing/Procedures: None ordered  Follow-Up: Your physician wants you to follow-up in: 1 year with Dr.Christopher You will receive a reminder letter in the mail two months in advance. If you don't receive a letter, please call our office to schedule the follow-up appointment.   Any Other Special Instructions Will Be Listed Below (If Applicable).     If you need a refill on your cardiac medications before your next appointment, please call your pharmacy.

## 2017-11-18 NOTE — Progress Notes (Signed)
Cardiology Office Note:    Date:  11/18/2017   ID:  Teresa Chang, DOB [birth year], MRN [id number]  PCP:  Lovena Neighbours, MD  Cardiologist:  Jodelle Red, MD PhD  Referring MD: Lovena Neighbours, MD   Chief Complaint  Patient presents with  . new evaluation    pt c/o swelling in both legs; no other Sx.    History of Present Illness:    Teresa Chang is a 47 y.o. female with a hx of hypertension and lower extremity swelling who is seen as a new patient at the request of Dr. Sydnee Cabal for evaluation and management of lower extremity edema. She brings pictures on her phone today of significant bilateral lower extremity edema to the point that her skin was tight.   Notes weight gain, especially over the last year. Thinks it is all fluid as her abdomen is the most swollen. Was 170-180lbs, now weighs up to 220 lbs. Came on gradually. After taking HCTZ swelling improved but her clothes are still tight. Hasn't noticed much change in her weight after HCTZ.  Has a history of high blood pressure, was on medication for 12 years (looks like lisinopril/HCTZ 10-12.5 mg per the chart). Started exercising last year, stopped taking blood pressure medications. Blood pressure well controlled off medications for nearly a year until recently.   Denies chest pain, shortness of breath (only with severe exertion and none recently, more when she was on blood pressure medication years ago), PND, orthopnea, palpitations, or syncope.  Reviewed the results of her echo with her, which showed diastolic dysfunction.  Past Medical History:  Diagnosis Date  . Anxiety   . Bipolar disorder (HCC)   . Depression   . Hypertension     History reviewed. No pertinent surgical history.  Current Medications: No current outpatient medications on file prior to visit.   No current facility-administered medications on file prior to visit.      Allergies:   Patient has no known allergies.   Social History    Socioeconomic History  . Marital status: Single    Spouse name: Not on file  . Number of children: Not on file  . Years of education: Not on file  . Highest education level: Not on file  Occupational History  . Not on file  Social Needs  . Financial resource strain: Not on file  . Food insecurity:    Worry: Not on file    Inability: Not on file  . Transportation needs:    Medical: Not on file    Non-medical: Not on file  Tobacco Use  . Smoking status: Former Smoker    Packs/day: 0.30  . Smokeless tobacco: Never Used  Substance and Sexual Activity  . Alcohol use: No  . Drug use: No  . Sexual activity: Not on file  Lifestyle  . Physical activity:    Days per week: Not on file    Minutes per session: Not on file  . Stress: Not on file  Relationships  . Social connections:    Talks on phone: Not on file    Gets together: Not on file    Attends religious service: Not on file    Active member of club or organization: Not on file    Attends meetings of clubs or organizations: Not on file    Relationship status: Not on file  Other Topics Concern  . Not on file  Social History Narrative  . Not on file   Quit smoking  3 years ago. Does not do routine exercise currently. Does not add salt to her food, but discussed hidden salt especially in processed foods.  Family History: The patient's family history includes Stroke in her maternal grandmother.  ROS:   Please see the history of present illness.  Additional pertinent ROS: Review of Systems  Constitutional: Negative for chills, fever and malaise/fatigue.  HENT: Negative for ear pain and hearing loss.   Eyes: Negative for blurred vision and pain.  Respiratory: Negative for cough, hemoptysis, sputum production and shortness of breath.   Cardiovascular: Positive for leg swelling. Negative for chest pain, palpitations, orthopnea, claudication and PND.  Gastrointestinal: Negative for abdominal pain, blood in stool and melena.   Genitourinary: Negative for dysuria and hematuria.  Musculoskeletal: Negative for joint pain and myalgias.  Skin: Negative for itching and rash.  Neurological: Negative for focal weakness and loss of consciousness.  Endo/Heme/Allergies: Does not bruise/bleed easily.   EKGs/Labs/Other Studies Reviewed:    The following studies were personally reviewed today: Echo 10/25/17 Left ventricle: The cavity size was normal. There was mild   concentric hypertrophy. Systolic function was normal. The   estimated ejection fraction was in the range of 60% to 65%. Wall   motion was normal; there were no regional wall motion   abnormalities. Features are consistent with a pseudonormal left   ventricular filling pattern, with concomitant abnormal relaxation   and increased filling pressure (grade 2 diastolic dysfunction). - Mitral valve: There was mild regurgitation.  EKG:  EKG is ordered today.  The ekg ordered today demonstrates normal sinus rhythm  Recent Labs: 10/12/2017: ALT 13; BNP 22.3; BUN 10; Creatinine, Ser 0.84; Hemoglobin 11.4; Platelets 265; Potassium 4.4; Sodium 139; TSH 0.677  Recent Lipid Panel    Component Value Date/Time   CHOL 176 05/25/2016 1620   TRIG 196 (H) 05/25/2016 1620   HDL 41 (L) 05/25/2016 1620   CHOLHDL 4.3 05/25/2016 1620   VLDL 39 (H) 05/25/2016 1620   LDLCALC 96 05/25/2016 1620    Physical Exam:    VS:  BP 124/80 (BP Location: Right Arm, Patient Position: Sitting, Cuff Size: Large)   Pulse 80   Ht 5\' 8"  (1.727 m)   Wt 221 lb (100.2 kg)   BMI 33.60 kg/m     Wt Readings from Last 3 Encounters:  11/18/17 221 lb (100.2 kg)  10/12/17 224 lb 12.8 oz (102 kg)  01/13/17 211 lb 12.8 oz (96.1 kg)     GEN: Well nourished, well developed in no acute distress HEENT: Normal NECK: No JVD; No carotid bruits LYMPHATICS: No lymphadenopathy CARDIAC: regular rhythm, normal S1 and S2, no murmurs, rubs, gallops. Radial and DP pulses 2+ bilaterally. RESPIRATORY:  Clear  to auscultation without rales, wheezing or rhonchi  ABDOMEN: Soft, non-tender, non-distended MUSCULOSKELETAL:  Trace lower extremity bilateral edema; No deformity  SKIN: Warm and dry NEUROLOGIC:  Alert and oriented x 3 PSYCHIATRIC:  Normal affect   ASSESSMENT:    1. Essential hypertension   2. Localized edema   3. Weight gain with edema   4. Chronic diastolic heart failure (HCC)    PLAN:    1. Edema, weight gain: improved with HCTZ. Echo shows grade II diastolic dysfunction.  -discussed etiology of diastolic dysfunction at length. She does not have significant shortness of breath, and there is no elevation of her JVD. I suspect that her diastolic dysfunction is related to hypertension history, though it is well treated today -continue HCTZ, medication renewed. Check BMET for  potassium and renal function today. If K low, can also try switching to spironolactone, would need to recheck a potassium one week after starting -discussed salt avoidance. Given rapid improvement on HCTZ, suspect that this is a large component of her symptoms. Also discussed leg elevation and compression stockings. -has had decreased activity. Was walking on treadmill before life got busy and hasn't had significant routine activity in some time. Counseled on guidelines for walking for heart heart. Suspect that majority of weight gain is due to slowing metabolism; she had good success with weight loss with walking in the past.  2. Diastolic dysfunction: her initial swelling may have been at least partially driven by diastolic heart failure. She appears euvolemic today. Counseled on avoiding extra pressure on the heart (elevated blood pressure, salt, etc).  Plan for follow up: 1 year or sooner PRN  Medication Adjustments/Labs and Tests Ordered: Current medicines are reviewed at length with the patient today.  Concerns regarding medicines are outlined above.  Orders Placed This Encounter  Procedures  . Basic metabolic  panel  . EKG 12-Lead   Meds ordered this encounter  Medications  . hydrochlorothiazide (HYDRODIURIL) 25 MG tablet    Sig: Take 1 tablet (25 mg total) by mouth daily.    Dispense:  90 tablet    Refill:  3    Patient Instructions  Medication Instructions:  Your physician recommends that you continue on your current medications as directed. Please refer to the Current Medication list given to you today.  Your HCTZ has been refilled today  Labwork: Bmet today  Testing/Procedures: None ordered  Follow-Up: Your physician wants you to follow-up in: 1 year with Dr.Niaja Stickley You will receive a reminder letter in the mail two months in advance. If you don't receive a letter, please call our office to schedule the follow-up appointment.   Any Other Special Instructions Will Be Listed Below (If Applicable).     If you need a refill on your cardiac medications before your next appointment, please call your pharmacy.      Signed, Jodelle Red, MD PhD 11/18/2017 9:48 AM    [facility]

## 2018-04-06 ENCOUNTER — Encounter: Payer: Self-pay | Admitting: Family Medicine

## 2018-04-06 ENCOUNTER — Ambulatory Visit (INDEPENDENT_AMBULATORY_CARE_PROVIDER_SITE_OTHER): Payer: Medicaid Other | Admitting: Family Medicine

## 2018-04-06 VITALS — BP 110/58 | HR 88 | Temp 98.6°F | Wt 184.2 lb

## 2018-04-06 DIAGNOSIS — R739 Hyperglycemia, unspecified: Secondary | ICD-10-CM

## 2018-04-06 DIAGNOSIS — E119 Type 2 diabetes mellitus without complications: Secondary | ICD-10-CM | POA: Diagnosis not present

## 2018-04-06 LAB — POCT GLYCOSYLATED HEMOGLOBIN (HGB A1C)

## 2018-04-06 MED ORDER — METFORMIN HCL 500 MG PO TABS: 500.0000 mg | ORAL_TABLET | Freq: Two times a day (BID) | ORAL | 3 refills | Status: DC

## 2018-04-06 MED ORDER — GLUCOSE BLOOD VI STRP: ORAL_STRIP | 12 refills | Status: DC

## 2018-04-06 MED ORDER — ACCU-CHEK AVIVA PLUS W/DEVICE KIT: PACK | 0 refills | Status: DC

## 2018-04-06 MED ORDER — INSULIN GLARGINE 100 UNIT/ML SOLOSTAR PEN: 20.0000 [IU] | PEN_INJECTOR | Freq: Every day | SUBCUTANEOUS | 99 refills | Status: DC

## 2018-04-06 MED ORDER — ACCU-CHEK MULTICLIX LANCETS MISC: 12 refills | Status: DC

## 2018-04-06 NOTE — Progress Notes (Signed)
Subjective:    Patient ID: [name], female    DOB: [birth year], 47 y.o.   MRN: [id number]   CC: Increased thirst, weight loss,  HPI: Patient is a 47 year old female who presents today complaining of increased thirst as well as weight loss for the past few months.  Patient reports that she has been drinking a lot of water every day and is unable to quench her thirst.  She also complains of increased frequency.  Patient also reports weight loss that is unexplained and unintentional.  Patient reports that she has been more tired and has not been getting out of bed much. Patient reports that she has stopped taking her Lasix because she did not have any lower extremity swelling anymore after she started losing the weight.  Patient reports that she has a family history of diabetes her mom is a diabetic.  Patient reports mild shortness of breath intermittently.  She currently denies any chest pain, lower extremity edema, nausea, vomiting, diarrhea or abdominal pain.  Smoking status reviewed   ROS: all other systems were reviewed and are negative other than in the HPI   Past Medical History:  Diagnosis Date  . Anxiety   . Bipolar disorder ([place])   . Depression   . Hypertension     History reviewed. No pertinent surgical history.  Past medical history, surgical, family, and social history reviewed and updated in the EMR as appropriate.  Objective:  BP (!) 110/58   Pulse 88   Temp 98.6 F (37 C) (Oral)   Wt 184 lb 4 oz (83.6 kg)   SpO2 98%   BMI 28.02 kg/m   Vitals and nursing note reviewed  General: NAD, pleasant, able to participate in exam Cardiac: RRR, normal heart sounds, no murmurs. 2+ radial and PT pulses bilaterally Respiratory: CTAB, normal effort, No wheezes, rales or rhonchi Abdomen: soft, nontender, nondistended, no hepatic or splenomegaly, +BS Extremities: no edema or cyanosis. WWP. Skin: warm and dry, no rashes noted Neuro: alert and oriented x4, no focal  deficits Psych: Normal affect and mood   Assessment & Plan:   Type 2 diabetes mellitus without complication, without long-term current use of insulin (HCC) A1c today is greater than 15.0.  This is a new diagnosis of type 2 diabetes.  Patient last A1c a year ago was 5.9.  Prior to that A1c in 2017 was 6.1.  Patient was never started on metformin for prediabetes.  She has a strong family history.  She comes in with polyuria and polydipsia accompanied with weight loss.  Will initiate insulin therapy as well as oral regimen.  Also refer patient to diabetic education.  We discussed lifestyle modifications in order to improve her glycemic control.  Patient was surprised but seems to be motivated to make necessary changes.  She will follow-up with Dr. Valentina Lucks for additional medication adjustment in early January.  We will see her 3 weeks after that.  Patient is in agreement with plan. --Order Lantus 20 units daily --Start patient on metformin 500 mg for the first week gradually increase as tolerated to a maximum of 2000 mg daily --Glucometer kit has been ordered, patient will check blood glucose in the morning and 2 hours after lunch and dinner.  She knows to bring glucometer at next office visit with Dr. Valentina Lucks. --We will refer to diabetic education. --Order BMP, microalbumin/creatinine, lipid panel. Patient will have blood work done at next office visit (1/16) due to time constraints today.   Mckinsley Koelzer  Billey Wojciak, [place] PGY-3

## 2018-04-06 NOTE — Patient Instructions (Addendum)
It was great seeing you today! We have addressed the following issues today  1. Your A1c is >15 which means you have been diagnosed with type 2 diabetes. 2. I will start you on Insulin 20 units a day in the morning. 3. You will also take Metformin in the beginning take 500 mg a day for a week, if you tolerate it well , increase it to 500 mg two times a day for the next 5 days. If you are still doing well without much side effect then you can go to 1000 mg two times a day. ( 2 pills in the morning and 2 pills in the evening) 4. I want you to check your Blood sugar in the morning before you eat and 2 hours after lunch and dinner.  5. I have made you an appointment to see Dr. Raymondo BandKoval our pharmacist, please make sure you bring your glucometer at that visit with him.  6. I also order some blood work to be done when you come see him on 04/27/2017. I will follow up on the results.  If we did any lab work today, and the results require attention, either me or my nurse will get in touch with you. If everything is normal, you will get a letter in mail and a message via . If you don't hear from us in two weeks, please give us a call. Otherwise, we look forward to seeing you again at your next visit. If you have any questions or concerns before then, please call the clinic at [phone].  Please bring all your medications to every doctors visit  Sign up for My Chart to have easy access to your labs results, and communication with your Primary care physician. Please ask Front Desk for some assistance.   Please check-out at the front desk before leaving the clinic.    Take Care,   Dr. Sydnee Cabaliallo

## 2018-04-06 NOTE — Assessment & Plan Note (Addendum)
A1c today is greater than 15.0.  This is a new diagnosis of type 2 diabetes.  Patient last A1c a year ago was 5.9.  Prior to that A1c in 2017 was 6.1.  Patient was never started on metformin for prediabetes.  She has a strong family history.  She comes in with polyuria and polydipsia accompanied with weight loss.  Will initiate insulin therapy as well as oral regimen.  Also refer patient to diabetic education.  We discussed lifestyle modifications in order to improve her glycemic control.  Patient was surprised but seems to be motivated to make necessary changes.  She will follow-up with Dr. Valentina Lucks for additional medication adjustment in early January.  We will see her 3 weeks after that.  Patient is in agreement with plan. --Order Lantus 20 units daily --Start patient on metformin 500 mg for the first week gradually increase as tolerated to a maximum of 2000 mg daily --Glucometer kit has been ordered, patient will check blood glucose in the morning and 2 hours after lunch and dinner.  She knows to bring glucometer at next office visit with Dr. Valentina Lucks. --We will refer to diabetic education. --Order BMP, microalbumin/creatinine, lipid panel. Patient will have blood work done at next office visit (1/16) due to time constraints today.

## 2018-04-07 ENCOUNTER — Other Ambulatory Visit: Payer: Self-pay | Admitting: Family Medicine

## 2018-04-07 ENCOUNTER — Telehealth: Payer: Self-pay

## 2018-04-07 DIAGNOSIS — E119 Type 2 diabetes mellitus without complications: Secondary | ICD-10-CM

## 2018-04-07 MED ORDER — METFORMIN HCL ER 500 MG PO TB24: 500.0000 mg | ORAL_TABLET | Freq: Every day | ORAL | 0 refills | Status: DC

## 2018-04-07 MED ORDER — PEN NEEDLES 32G X 4 MM MISC: 20.0000 [IU] | Freq: Every day | 1 refills | Status: DC

## 2018-04-07 MED ORDER — EMPAGLIFLOZIN 10 MG PO TABS: 10.0000 mg | ORAL_TABLET | Freq: Every day | ORAL | 0 refills | Status: DC

## 2018-04-07 NOTE — Telephone Encounter (Signed)
Pt called nurse line to inform MD that she has been experiencing diarrhea with her first dose of Metformin. Pt stated she only, at this point, had to use the restroom once. I informed patient I would like him now and to call back if her sxs become more significant. I did assure her this is a side effect of the medication.

## 2018-04-07 NOTE — Telephone Encounter (Signed)
Called and talked to the patient new prescription for metformin XR was sent in addition to pen needles for lantus pen and Jardiance. Will follow up next week with patient.  Thanks  Lovena NeighboursAbdoulaye Zeev Deakins, MD [facility], PGY-3

## 2018-04-07 NOTE — Telephone Encounter (Signed)
Pt called nurse line stating her pharmacy notified her yesterday that she needs lantus pen needles called in. Pt would like for this to go to the [facility] on [address]

## 2018-04-14 ENCOUNTER — Other Ambulatory Visit: Payer: Self-pay | Admitting: Family Medicine

## 2018-04-14 ENCOUNTER — Telehealth: Payer: Self-pay

## 2018-04-14 DIAGNOSIS — E119 Type 2 diabetes mellitus without complications: Secondary | ICD-10-CM

## 2018-04-14 MED ORDER — INSULIN ASPART 100 UNIT/ML ~~LOC~~ SOLN: 5.0000 [IU] | Freq: Two times a day (BID) | SUBCUTANEOUS | 99 refills | Status: DC

## 2018-04-14 NOTE — Telephone Encounter (Signed)
Called and discussed patient T2DM regimen. Will sent Novolog today. Patient will follow up with Dr.Koval on 1/16.  Lovena NeighboursAbdoulaye Kharee Lesesne, MD [facility], PGY-3

## 2018-04-14 NOTE — Telephone Encounter (Signed)
Patient called and would like to speak to PCP regarding fluctuating blood sugars and DM meds.  Call back is [phone].  Ples Specter, RN [facility] RN)

## 2018-04-17 ENCOUNTER — Other Ambulatory Visit: Payer: Self-pay

## 2018-04-17 DIAGNOSIS — E119 Type 2 diabetes mellitus without complications: Secondary | ICD-10-CM

## 2018-04-17 MED ORDER — INSULIN ASPART 100 UNIT/ML FLEXPEN: 5.0000 [IU] | PEN_INJECTOR | Freq: Two times a day (BID) | SUBCUTANEOUS | 11 refills | Status: DC

## 2018-04-17 MED ORDER — PEN NEEDLES 32G X 4 MM MISC: 20.0000 [IU] | Freq: Every day | 1 refills | Status: DC

## 2018-04-17 NOTE — Telephone Encounter (Signed)
Patient and her pharmacy left a message. Novolog vials sent in on 01/03 but were supposed to be pens.   Please change to pens and resend. Also, needs pen needles sent.  Send to [facility] on [address].  Patient [phone] [facility] [phone]

## 2018-04-17 NOTE — Telephone Encounter (Signed)
Medication resent to pharmacy. Jazmin Hartsell,CMA  

## 2018-04-18 ENCOUNTER — Telehealth: Payer: Self-pay | Admitting: *Deleted

## 2018-04-18 NOTE — Telephone Encounter (Signed)
Pt calls for 2 reasons.  1. When she took novolog last night (first time) she vomited and had diarrhea several times. She feels like this is an allergic reaction.  Advised to continue other meds and hold novolog until she hears back from office.  2. Wants to know if she still needs to take the HCTZ that she was taking for her "swelling"  She has not taken these "in a while" but just wanted to make sure.   Will forward to MD. Fleeger, Maryjo Rochester, CMA

## 2018-04-19 NOTE — Telephone Encounter (Signed)
Called and discussed with patient reaction to Novolog. She will also discontinue HCTZ for now. She will follow up with Dr.Koval on 1/16 diabetes management.

## 2018-04-21 ENCOUNTER — Telehealth: Payer: Self-pay

## 2018-04-21 NOTE — Telephone Encounter (Signed)
Patient called and sounds like her blood sugar dropped fairly quickly. It was over 200 this am and she took her meds and an hour or so later felt very bad and her sugar was 80. She drank some juice and it is now 124 but she still feels poorly and is having alternating hot/cold spells.   Stated she had moved her Lantus from 3 units back to 5 units which may have contributed. She is concerned and would like to speak to PCP.  Call back is [phone]  Ples Specter, RN [facility] RN)

## 2018-04-24 NOTE — Telephone Encounter (Signed)
Called and talk to patient about insulin regimen. Patient will follow up with Dr. Raymondo Band on 1/16 for further management of her regimen for T2DM.  Thanks   Lovena Neighbours, MD [facility], PGY-3

## 2018-04-27 ENCOUNTER — Ambulatory Visit (INDEPENDENT_AMBULATORY_CARE_PROVIDER_SITE_OTHER): Payer: Medicaid Other | Admitting: Pharmacist

## 2018-04-27 DIAGNOSIS — E119 Type 2 diabetes mellitus without complications: Secondary | ICD-10-CM

## 2018-04-27 MED ORDER — GLUCOSE BLOOD VI STRP: ORAL_STRIP | 12 refills | Status: DC

## 2018-04-27 MED ORDER — INSULIN ASPART 100 UNIT/ML FLEXPEN: 3.0000 [IU] | PEN_INJECTOR | Freq: Two times a day (BID) | SUBCUTANEOUS | 11 refills | Status: DC

## 2018-04-27 MED ORDER — EMPAGLIFLOZIN 10 MG PO TABS: 10.0000 mg | ORAL_TABLET | Freq: Every day | ORAL | 1 refills | Status: DC

## 2018-04-27 MED ORDER — METFORMIN HCL ER 500 MG PO TB24: 500.0000 mg | ORAL_TABLET | Freq: Two times a day (BID) | ORAL | 2 refills | Status: DC

## 2018-04-27 NOTE — Progress Notes (Signed)
S:     Chief Complaint  Patient presents with  . Medication Management    diabetes    Patient arrives in good spirits walking without assistance. Presents for diabetes evaluation, education, and management at the request of Dr. Sydnee Cabaliallo. Patient was referred on 04/06/2018 by her Primary Care Provider on   Patient reports Diabetes was diagnosed in 03/2018.   Family/Social History: Mother- diabetes; Cousin on dialysis, not associated with diabetes.   Insurance coverage/medication affordability: [place] Medicaid  Patient reports adherence with medications.  Current diabetes medications include: Jardiance (empagliflozin), Novolog 3 units BID, Lantus 20 units qday.   Patient reports hypoglycemic events in the afternoon with readings of 80. Patient manages hypoglycemic events with cake, bread, or juice.   Patient reported dietary habits: Eats 2 meals/day Breakfast: egg, bacon/turkey sausage "some type of meat." Pt reports appetite "isn't what it used to be," and sometimes eats an english muffin so she can take her insulin Lunch: patient often skips lunch Dinner: hamburger, chicken, "staying away from pork," green beans, salad, turnip greens, potatoes Drinks: water, sometimes Diet Pepsi   Patient denies nocturia. Improved since two weeks ago.  Patient denies neuropathy. Before diagnosis patient noticed tingling in hands.  Patient reports visual changes. Left eye is blurry.  Patient was counseled on self foot exams.    O:  Physical Exam Constitutional:      Appearance: Normal appearance.  Neurological:     Mental Status: She is alert.      Review of Systems  All other systems reviewed and are negative.    Lab Results  Component Value Date   HGBA1C >15.0 04/06/2018   Vitals:   04/27/18 1629  BP: 126/88  Pulse: 66  SpO2: 99%    Lipid Panel     Component Value Date/Time   CHOL 176 05/25/2016 1620   TRIG 196 (H) 05/25/2016 1620   HDL 41 (L) 05/25/2016 1620   CHOLHDL 4.3 05/25/2016 1620   VLDL 39 (H) 05/25/2016 1620   LDLCALC 96 05/25/2016 1620    Home fasting CBG: 100-150 (meter was not available - patient report only) 2 hour post-prandial/random CBG:  (Pre-dinner reported to be ~ 200)   Clinical ASCVD: No The 10-year ASCVD risk score Denman George(Goff DC Jr., et al., 2013) is: 9.1%   Values used to calculate the score:     Age: [age] years     Sex: Female     Is Non-Hispanic African American: Yes     Diabetic: Yes     Tobacco smoker: No     Systolic Blood Pressure: 126 mmHg     Is BP treated: Yes     HDL Cholesterol: 41 mg/dL     Total Cholesterol: 176 mg/dL    A/P: Diabetes recently diagnosed currently uncontrolled. Patient is able to verbalize appropriate hypoglycemia management plan. Patient is adherent with medication. Control is suboptimal due to recent diagnosis and learning how to manage diabetes. -Continue basal insulin Lantus (insulin glargine).  -Adjusted dose of rapid insulin Novolog (insulin aspart) to 3 to 4 units twice daily. (4 units if pre-meal CBG > 200) -Continued SGLT2-I Jardiance (generic name empagliflozin) at 10mg  daily -Extensively discussed pathophysiology of DM, recommended lifestyle interventions, dietary effects on glycemic control. -Counseled on s/sx of and management of hypoglycemia.   Written patient instructions provided.  Total time in face to face counseling 45 minutes.   Follow up Pharmacist Clinic Visit in 3 weeks. Trina Aoaroline Luther, PharmD Candidate and Catie Feliz Beamravis, PharmD,  PGY2 Pharmacy Resident.

## 2018-04-27 NOTE — Patient Instructions (Addendum)
It was nice to meet you today!   Use three to four units of Novolog twice daily. If your sugar is above 200, use four units twice daily.  Start taking Metformin XR 500 mg twice daily.   Continue measuring your blood sugar daily and bring your monitor to the next clinic visit. Continue trying to minimize carb intake and increase fruits and vegetables.    Please schedule follow up with [place] in 3 weeks.

## 2018-04-27 NOTE — Progress Notes (Signed)
Patient ID: Teresa Chang, female   DOB: 21-Dec-1970, 48 y.o.   MRN: [id number] Reviewed: I agree with Dr. Macky Lower documentation and management.

## 2018-04-27 NOTE — Assessment & Plan Note (Signed)
Diabetes recently diagnosed currently uncontrolled. Patient is able to verbalize appropriate hypoglycemia management plan. Patient is adherent with medication. Control is suboptimal due to recent diagnosis and learning how to manage diabetes. -Continue basal insulin Lantus (insulin glargine).  -Adjusted dose of rapid insulin Novolog (insulin aspart) to 3 to 4 units twice daily. (4 units if pre-meal CBG > 200) -Continued SGLT2-I Jardiance (generic name empagliflozin) at 10mg  daily -Extensively discussed pathophysiology of DM, recommended lifestyle interventions, dietary effects on glycemic control. -Counseled on s/sx of and management of hypoglycemia.

## 2018-05-15 ENCOUNTER — Encounter: Payer: Self-pay | Admitting: Pharmacist

## 2018-05-15 ENCOUNTER — Ambulatory Visit (INDEPENDENT_AMBULATORY_CARE_PROVIDER_SITE_OTHER): Payer: Medicaid Other | Admitting: Pharmacist

## 2018-05-15 DIAGNOSIS — E119 Type 2 diabetes mellitus without complications: Secondary | ICD-10-CM

## 2018-05-15 MED ORDER — EMPAGLIFLOZIN 25 MG PO TABS: 25.0000 mg | ORAL_TABLET | Freq: Every day | ORAL | 3 refills | Status: DC

## 2018-05-15 MED ORDER — INSULIN GLARGINE 100 UNIT/ML SOLOSTAR PEN: 14.0000 [IU] | PEN_INJECTOR | Freq: Every day | SUBCUTANEOUS | 99 refills | Status: DC

## 2018-05-15 MED ORDER — INSULIN ASPART 100 UNIT/ML FLEXPEN: 5.0000 [IU] | PEN_INJECTOR | Freq: Two times a day (BID) | SUBCUTANEOUS | 11 refills | Status: DC

## 2018-05-15 NOTE — Progress Notes (Addendum)
S:     Chief Complaint  Patient presents with  . Medication Management    diabetes    Patient arrives in good spirits and ambulates without assistance.  Presents for diabetes evaluation, education, and management at the request of Dr. Sydnee Cabal. Patient was referred on 04/06/2018.  Patient was last seen by Primary Care Provider on 04/06/2018. Patient was last seen in Rx Clinic on 04/27/2018.    Patient reports Diabetes was diagnosed in 03/2018.   Patient reports some days are "harder than others": difficult to get out of bed, reports feeling "low energy"  Family/Social History: Mother-DM; former smoker  [employer] affordability: [place] Medicaid  Patient reports adherence with medications.  Current diabetes medications include: Metformin XR 2000 daily, Lantus 20 units, Novolog 3-4 units BID, Jardiance (empagliflozin) 10 mg Current hypertension medications include: HCTZ 25 mg  Patient reports hypoglycemic events. Reports readings of 74 and 76 on separate occasions, usually in the evening. Reported symptoms include feeling cold and jittery.    Patient reported dietary habits: Eats 2 meals/day Breakfast: scrambled eggs, toast, sometimes bacon Lunch: does not eat lunch Dinner: varies; chicken, fish, stays away from pork; trying to slow down on spaghetti; turnip greens, green beans, beans  Snacks: grapefruit, oranges, fruit cups Drinks: water or diet soda, OJ   Patient denies neuropathy. Patient reports self foot exams. Patient asks about foot care and reports no problems.      O:  Physical Exam Constitutional:      Appearance: Normal appearance.  Neurological:     Mental Status: She is alert.  Psychiatric:        Behavior: Behavior normal.    Review of Systems  All other systems reviewed and are negative.    Lab Results  Component Value Date   HGBA1C >15.0 04/06/2018   Vitals:   05/15/18 1100  BP: 110/84  Pulse: (!) 103  SpO2: 99%    Lipid  Panel     Component Value Date/Time   CHOL 176 05/25/2016 1620   TRIG 196 (H) 05/25/2016 1620   HDL 41 (L) 05/25/2016 1620   CHOLHDL 4.3 05/25/2016 1620   VLDL 39 (H) 05/25/2016 1620   LDLCALC 96 05/25/2016 1620    Home CBG 7 day average: 138  14 day average CBG: 142  Clinical ASCVD: No  -No recent lipid panel on file; last LDL in 2018   A/P: Diabetes diagnosed 03/2018 currently controlled. Patient is able to verbalize appropriate hypoglycemia management plan. Reports episodes of hypoglycemia, managed with orange juice or cake. Patient reports adherence with medication.  -Decreased dose of basal insulin Lantus (insulin glargine) from 20 units to 14 units. Patient will titrate to 16 units if fasting AM CBGs > 200mg /dl  -Increased dose of  rapid insulin Novolog (insulin aspart) from 3-4 units to 5 units BID.  -Increased dose of SGLT2-I Jardiance (empagliflozin) to 25 mg with next Rx.  Patient advised to use 20mg  (2x 10mg  tabs) until supply is gone.  -Extensively discussed pathophysiology of DM, recommended lifestyle interventions, dietary effects on glycemic control -Counseled on s/sx of and management of hypoglycemia -Next A1C anticipated 06/2018.   ASCVD risk - primary prevention in patient with DM. Last LDL is on file from 2018.  -Ordered lipid panel today.    Hypertension longstanding currently controlled.  BP goal = 130/80 mmHg. Patient reports adherence with medication.  -Continue HCTZ 25 mg  Written patient instructions provided.  Total time in face to face counseling 30 minutes.  Follow up PCP Clinic Visit on 06/02/2018.   Patient seen with Trina Aoaroline Luther, PharmD Candidate.   LDL was elevated and plan to discuss initiation of statin at neck PCP visit.  CMET Normal.   Glucose slightly elevated.

## 2018-05-15 NOTE — Patient Instructions (Signed)
It was great to see you today!  We are making a few changes today:  1. Decrease your Lantus to 14 units daily. If you see morning sugar readings in the 200s, increase to 16 units daily.  2. Increase your Novolog to 5 units twice daily.  3. Start taking Jardiance 25 mg daily. For now, you can take two 10 mg tablets daily until you run out of the supply you have. Then pick up 25 mg from the pharmacy.  4. You can take 3 tablets of metformin for a week to see if diarrhea improves. If diarrhea improves, you can go back to using 4 tablets daily after a week.  5. We will be getting some blood work today to check on your cholesterol and electrolytes.   Follow up with Dr. Sydnee Cabal on 06/02/2018.

## 2018-05-15 NOTE — Assessment & Plan Note (Signed)
Diabetes diagnosed 03/2018 currently controlled. Patient is able to verbalize appropriate hypoglycemia management plan. Reports episodes of hypoglycemia, managed with orange juice or cake. Patient reports adherence with medication.  -Decreased dose of basal insulin Lantus (insulin glargine) from 20 units to 14 units. Patient will titrate to 16 units if fasting AM CBGs > 200mg /dl  -Increased dose of  rapid insulin Novolog (insulin aspart) from 3-4 units to 5 units BID.  -Increased dose of SGLT2-I Jardiance (empagliflozin) to 25 mg with next Rx.  Patient advised to use 20mg  (2x 10mg  tabs) until supply is gone.  -Extensively discussed pathophysiology of DM, recommended lifestyle interventions, dietary effects on glycemic control -Counseled on s/sx of and management of hypoglycemia -Next A1C anticipated 06/2018.   ASCVD risk - primary prevention in patient with DM. Last LDL is on file from 2018.  -Ordered lipid panel today.

## 2018-05-15 NOTE — Progress Notes (Signed)
Patient ID: Teresa Chang, female   DOB: 06/04/1970, 47 y.o.   MRN: [id number] Reviewed: I agree with Dr. Koval's documentation and management. 

## 2018-05-16 LAB — COMPREHENSIVE METABOLIC PANEL
ALK PHOS: 63 IU/L (ref 39–117)
ALT: 6 IU/L (ref 0–32)
AST: 10 IU/L (ref 0–40)
Albumin/Globulin Ratio: 1.6 (ref 1.2–2.2)
Albumin: 4.1 g/dL (ref 3.8–4.8)
BUN/Creatinine Ratio: 12 (ref 9–23)
BUN: 11 mg/dL (ref 6–24)
Bilirubin Total: 0.2 mg/dL (ref 0.0–1.2)
CO2: 22 mmol/L (ref 20–29)
Calcium: 9.5 mg/dL (ref 8.7–10.2)
Chloride: 100 mmol/L (ref 96–106)
Creatinine, Ser: 0.95 mg/dL (ref 0.57–1.00)
GFR calc Af Amer: 82 mL/min/{1.73_m2} (ref >59)
GFR calc non Af Amer: 71 mL/min/{1.73_m2} (ref >59)
Globulin, Total: 2.6 g/dL (ref 1.5–4.5)
Glucose: 117 mg/dL — ABNORMAL HIGH (ref 65–99)
POTASSIUM: 4 mmol/L (ref 3.5–5.2)
Sodium: 141 mmol/L (ref 134–144)
TOTAL PROTEIN: 6.7 g/dL (ref 6.0–8.5)

## 2018-05-16 LAB — LIPID PANEL
Chol/HDL Ratio: 4.1 ratio (ref 0.0–4.4)
Cholesterol, Total: 216 mg/dL — ABNORMAL HIGH (ref 100–199)
HDL: 53 mg/dL (ref >39)
LDL Calculated: 140 mg/dL — ABNORMAL HIGH (ref 0–99)
Triglycerides: 113 mg/dL (ref 0–149)
VLDL Cholesterol Cal: 23 mg/dL (ref 5–40)

## 2018-05-26 ENCOUNTER — Telehealth: Payer: Self-pay

## 2018-05-26 DIAGNOSIS — E119 Type 2 diabetes mellitus without complications: Secondary | ICD-10-CM

## 2018-05-26 MED ORDER — METFORMIN HCL ER 500 MG PO TB24: 1000.0000 mg | ORAL_TABLET | Freq: Two times a day (BID) | ORAL | 2 refills | Status: DC

## 2018-05-26 NOTE — Telephone Encounter (Signed)
Patient called nurse line stating she made a mistake, she does not have a form to drop off, her pcp needs to just write a letter, with our letter head, stating she can not work. Pt stated, "he knows what I am talking about." Pt would like to pick this up next week. Pt stated she also needs a refill on Metformin.

## 2018-05-26 NOTE — Telephone Encounter (Signed)
Patient left message on nurse line after 5 that just asks for PCP to call her to discuss an issue.  Call back is [phone].  Ples Specter, RN [facility] RN)

## 2018-05-26 NOTE — Addendum Note (Signed)
Addended by: Lovena Neighbours F on: 05/26/2018 06:32 PM   Modules accepted: Orders

## 2018-05-26 NOTE — Telephone Encounter (Signed)
Called and talked to patient this morning, she will bring a letter for me to sign this morning.  Thank you  Lovena Neighbours, MD [facility], PGY-3

## 2018-05-26 NOTE — Addendum Note (Signed)
Addended by: Steva Colder on: 05/26/2018 03:55 PM   Modules accepted: Orders

## 2018-05-30 ENCOUNTER — Other Ambulatory Visit: Payer: Self-pay | Admitting: Family Medicine

## 2018-06-02 ENCOUNTER — Ambulatory Visit (INDEPENDENT_AMBULATORY_CARE_PROVIDER_SITE_OTHER): Payer: Medicaid Other | Admitting: Family Medicine

## 2018-06-02 ENCOUNTER — Encounter: Payer: Self-pay | Admitting: Family Medicine

## 2018-06-02 ENCOUNTER — Other Ambulatory Visit: Payer: Self-pay

## 2018-06-02 VITALS — BP 110/80 | HR 81 | Temp 98.2°F | Wt 185.0 lb

## 2018-06-02 DIAGNOSIS — E782 Mixed hyperlipidemia: Secondary | ICD-10-CM | POA: Diagnosis not present

## 2018-06-02 DIAGNOSIS — I10 Essential (primary) hypertension: Secondary | ICD-10-CM

## 2018-06-02 DIAGNOSIS — E119 Type 2 diabetes mellitus without complications: Secondary | ICD-10-CM | POA: Diagnosis not present

## 2018-06-02 DIAGNOSIS — M7989 Other specified soft tissue disorders: Secondary | ICD-10-CM | POA: Diagnosis not present

## 2018-06-02 LAB — POCT GLYCOSYLATED HEMOGLOBIN (HGB A1C): HbA1c, POC (controlled diabetic range): 8.9 % — AB (ref 0.0–7.0)

## 2018-06-02 MED ORDER — FUROSEMIDE 20 MG PO TABS: 20.0000 mg | ORAL_TABLET | ORAL | 2 refills | Status: DC

## 2018-06-02 MED ORDER — LISINOPRIL 40 MG PO TABS: 40.0000 mg | ORAL_TABLET | Freq: Every day | ORAL | 3 refills | Status: DC

## 2018-06-02 MED ORDER — ROSUVASTATIN CALCIUM 20 MG PO TABS: 20.0000 mg | ORAL_TABLET | Freq: Every day | ORAL | 3 refills | Status: DC

## 2018-06-02 NOTE — Patient Instructions (Signed)
It was great seeing you today! We have addressed the following issues today  1. I am starting you on a new BP pressure medication that will help you protect your kidneys with diabetes.  2. Take furosemide every other day for the swelling in your legs.  3. I am starting you on a statin for cholesterol called Crestor.    If we did any lab work today, and the results require attention, either me or my nurse will get in touch with you. If everything is normal, you will get a letter in mail and a message via . If you don't hear from [place] in two weeks, please give [place] a call. Otherwise, we look forward to seeing you again at your next visit. If you have any questions or concerns before then, please call the clinic at [phone].  Please bring all your medications to every doctors visit  Sign up for My Chart to have easy access to your labs results, and communication with your Primary care physician. Please ask Front Desk for some assistance.   Please check-out at the front desk before leaving the clinic.    Take Care,   Dr. Sydnee Cabal

## 2018-06-02 NOTE — Assessment & Plan Note (Signed)
Patient was seen by cardiology a few months ago after she complained of lower extremity swelling and echo showed diastolic dysfunction.  Patient was started on HCTZ which she has been taking.  She reports good response and minimal swelling.  However given her new diagnosis of diabetes, will start patient on Lasix every other day in addition to lisinopril and discontinue HCTZ.  Patient will follow-up with cardiology for further titration of her medication regimen.

## 2018-06-02 NOTE — Assessment & Plan Note (Signed)
A1c today 8.9 down from >15 2 months ago when she was initially diagnosed.  Patient has not been recording fasting blood glucose in the morning and therefore has been unable to titrate Lantus.  We will keep her on 14 units however she will increase it to 16 units if blood glucose in the morning are greater than 200 as instructed by Dr. Raymondo Band.  She will continue with NovoLog 5 mg twice daily and Jardiance 25 mg daily.  Will make appointment to see Dr. Raymondo Band in 1 month.  We will put in referral for diabetic education.

## 2018-06-02 NOTE — Progress Notes (Signed)
,   Subjective:    Patient ID: Teresa Chang, female    DOB: 01-26-1971, 48 y.o.   MRN: [id number]   CC: Type 2 diabetes management follow-up  HPI: Patient is a 48 year old female with a past medical history significant for type 2 diabetes (recent diagnosis) who presents today to follow-up on type 2 diabetes management.  Patient was recently seen by Dr. Raymondo Band on 2/3 for further titration of her current medication regimen.  She is currently on Lantus 14 units once daily and NovoLog 5 units twice daily.  Patient is also on Jardiance 25 mg daily.  Patient seems to be doing well today, still reporting some fatigue but overall improving.  Patient also had blood work done last time she was here and would like to discuss some of the results.  She denies any polyuria, polydipsia.  No other acute complaints today.  Smoking status reviewed   ROS: all other systems were reviewed and are negative other than in the HPI   Past Medical History:  Diagnosis Date  . Anxiety   . Bipolar disorder (HCC)   . Depression   . Hypertension     History reviewed. No pertinent surgical history.  Past medical history, surgical, family, and social history reviewed and updated in the EMR as appropriate.  Objective:  BP 110/80   Pulse 81   Temp 98.2 F (36.8 C) (Oral)   Wt 185 lb (83.9 kg)   LMP 05/15/2018   SpO2 99%   BMI 25.80 kg/m   Vitals and nursing note reviewed  General: NAD, pleasant, able to participate in exam Cardiac: RRR, normal heart sounds, no murmurs. 2+ radial and PT pulses bilaterally Respiratory: CTAB, normal effort, No wheezes, rales or rhonchi Abdomen: soft, nontender, nondistended, no hepatic or splenomegaly, +BS Extremities: no edema or cyanosis. WWP. Skin: warm and dry, no rashes noted Neuro: alert and oriented x4, no focal deficits Psych: Normal affect and mood   Assessment & Plan:    Type 2 diabetes mellitus without complication, without long-term current use of insulin  (HCC) A1c today 8.9 down from >15 2 months ago when she was initially diagnosed.  Patient has not been recording fasting blood glucose in the morning and therefore has been unable to titrate Lantus.  We will keep her on 14 units however she will increase it to 16 units if blood glucose in the morning are greater than 200 as instructed by Dr. Raymondo Band.  She will continue with NovoLog 5 mg twice daily and Jardiance 25 mg daily.  Will make appointment to see Dr. Raymondo Band in 1 month.  We will put in referral for diabetic education.  Swelling of lower extremity Patient was seen by cardiology a few months ago after she complained of lower extremity swelling and echo showed diastolic dysfunction.  Patient was started on HCTZ which she has been taking.  She reports good response and minimal swelling.  However given her new diagnosis of diabetes, will start patient on Lasix every other day in addition to lisinopril and discontinue HCTZ.  Patient will follow-up with cardiology for further titration of her medication regimen.  Mixed hyperlipidemia Patient had lipid panel at last office visit which showed total cholesterol 206, HDL 53, LDL 140, triglyceride 113.  Given recent diagnosis of diabetes and patient's age will start on moderate intensity statin.  ASCVD risk 4.3%.  Recent CMP did not show any evidence of decreased liver function. --Start patient on Crestor 20 mg daily  Lovena Neighbours, MD [facility] PGY-3

## 2018-06-02 NOTE — Assessment & Plan Note (Addendum)
Patient had lipid panel at last office visit which showed total cholesterol 206, HDL 53, LDL 140, triglyceride 113.  Given recent diagnosis of diabetes and patient's age will start on moderate intensity statin.  ASCVD risk 4.3%.  Recent CMP did not show any evidence of decreased liver function. --Start patient on Crestor 20 mg daily

## 2018-06-15 ENCOUNTER — Encounter: Payer: Medicaid Other | Attending: Family Medicine | Admitting: Registered"

## 2018-06-15 ENCOUNTER — Encounter: Payer: Self-pay | Admitting: Registered"

## 2018-06-15 VITALS — Ht 68.0 in | Wt 190.3 lb

## 2018-06-15 DIAGNOSIS — E119 Type 2 diabetes mellitus without complications: Secondary | ICD-10-CM | POA: Insufficient documentation

## 2018-06-15 NOTE — Progress Notes (Signed)
Diabetes Self-Management Education  Visit Type: First/Initial  Appt. Start Time: 1120 Appt. End Time: 1230  06/15/2018  [name], identified by name and date of birth, is a 48 y.o. female with a diagnosis of Diabetes: Type 2.   ASSESSMENT  Height 5\' 8"  (1.727 m), weight 190 lb 4.8 oz (86.3 kg). Body mass index is 28.94 kg/m.  Pt states she has gained about 6 lbs in last 2 weeks. Pt states states her appetite has been reduced whereas before wanted to eat a lot of different foods she considers "junk food" but with Dr. Macky Lower help feels she has changed her diet and eating healthier and is more cautious about what she eats. Pt reports she has really been enjoying grapefruit lately and eating a lot of it.  Pt states she would like to start doing more exercise but cautious due to recent heart failure with continuing symptoms of exhaustion, SOB, dizzy, and lower ability to concentrate. Pt states she has to be patient with herself studying for final exams because of difficulty retaining information.  Pt reports hypoglycemic episodes before the change in her insulin, no symptoms in last 2 weeks. Pt provided meter RD noted BG range from 84 - 151 mg/dL. Pt states she was told that 124 was her goal during anytime of the day.   Pt states she feels she drinks a lot of water, from dietary recall RD estimates ~50 oz, plus 4 c juice. RD encouraged patient to reduce juice intake.  Patient is very interested in continuing nutrition counseling.   Diabetes Self-Management Education - 06/15/18 1137      Visit Information   Visit Type  First/Initial      Initial Visit   Diabetes Type  Type 2    Are you currently following a meal plan?  No    Are you taking your medications as prescribed?  Yes   latus, novolog, metformin, jardiance   Date Diagnosed  December 2019      Health Coping   How would you rate your overall health?  Poor      Psychosocial Assessment   Patient Belief/Attitude about  Diabetes  Afraid    How often do you need to have someone help you when you read instructions, pamphlets, or other written materials from your doctor or pharmacy?  1 - Never    What is the last grade level you completed in school?  sophomore college      Complications   Last HgB A1C per patient/outside source  8.9 %    How often do you check your blood sugar?  3-4 times/day    Fasting Blood glucose range (mg/dL)  70-017   494-496   Number of hypoglycemic episodes per month  0    Number of hyperglycemic episodes per week  0    Have you had a dilated eye exam in the past 12 months?  No    Have you had a dental exam in the past 12 months?  Yes    Are you checking your feet?  No      Dietary Intake   Breakfast  none OR egg, cheese, english muffin, sometimes bacon OR grits, eggs    Snack (morning)  a lot of grapefruit OR hostess cupcake OR pecan coconut cookie OR sherbet    Dinner  baked chicken, salad or green vegetables OR burger OR flounder & shrimp, french fries water & lemon   5-6 pm   Beverage(s)  diet pepsi,  4 c fruit juice, maybe 48 oz water      Exercise   Exercise Type  ADL's    How many days per week to you exercise?  0    How many minutes per day do you exercise?  0    Total minutes per week of exercise  0      Patient Education   Previous Diabetes Education  Yes (please comment)   dr. Raymondo Band    Disease state   Definition of diabetes, type 1 and 2, and the diagnosis of diabetes    Nutrition management   Role of diet in the treatment of diabetes and the relationship between the three main macronutrients and blood glucose level;Carbohydrate counting    Physical activity and exercise   Role of exercise on diabetes management, blood pressure control and cardiac health.    Medications  Reviewed patients medication for diabetes, action, purpose, timing of dose and side effects.    Monitoring  Identified appropriate SMBG and/or A1C goals.;Purpose and frequency of SMBG.    Acute  complications  Taught treatment of hypoglycemia - the 15 rule.      Individualized Goals (developed by patient)   Nutrition  General guidelines for healthy choices and portions discussed    Medications  take my medication as prescribed    Monitoring   test my blood glucose as discussed      Outcomes   Expected Outcomes  Demonstrated interest in learning. Expect positive outcomes    Future DMSE  4-6 wks    Program Status  Not Completed       Individualized Plan for Diabetes Self-Management Training:   Learning Objective:  Patient will have a greater understanding of diabetes self-management. Patient education plan is to attend individual and/or group sessions per assessed needs and concerns.  Patient Instructions  [email or web address] Consider taking vitamin D overt the counter 2000 units and also ask your doctor about checking your vitamin D level. Ask Dr. Raymondo Band about grapefruit if you would like to continue eating it. Consider eating balanced meals and snacks, can use handouts for ideas. Continue taking medications as your MD has prescribed. Consider increasing your physical activity as you are able   Expected Outcomes:  Demonstrated interest in learning. Expect positive outcomes  Education material provided: A1C conversion sheet and My Plate snack sheet  If problems or questions, patient to contact team via:  Phone and MyChart  Future DSME appointment: 4-6 wks

## 2018-06-15 NOTE — Patient Instructions (Addendum)
[email or web address] Consider taking vitamin D over the counter 2000 units and also ask your doctor about checking your vitamin D level. Ask Dr. Raymondo Band about grapefruit if you would like to continue eating it. Consider eating balanced meals and snacks, can use handouts for ideas. Continue taking medications as your MD has prescribed. Consider increasing your physical activity as you are able

## 2018-06-16 ENCOUNTER — Telehealth: Payer: Self-pay | Admitting: *Deleted

## 2018-06-16 NOTE — Telephone Encounter (Signed)
Patient states that she was told by the nutritionist yesterday that she should check with Dr. Sydnee Cabal about eating so much grapefruit.  She states that she is having no "side effects" that she can think of.  [place], Maryjo Rochester, CMA

## 2018-06-16 NOTE — Telephone Encounter (Signed)
Called and discussed grapefruit consumption with patient. She is not on any medications that will have significant interaction with the grapefruit. Will eat with moderation given Diabetes.  Thanks  Lovena Neighbours, MD [facility], PGY-3

## 2018-06-20 ENCOUNTER — Encounter: Payer: Self-pay | Admitting: Cardiology

## 2018-06-20 ENCOUNTER — Ambulatory Visit (INDEPENDENT_AMBULATORY_CARE_PROVIDER_SITE_OTHER): Payer: Medicaid Other | Admitting: Cardiology

## 2018-06-20 VITALS — BP 119/75 | HR 77 | Ht 68.0 in | Wt 191.0 lb

## 2018-06-20 DIAGNOSIS — E119 Type 2 diabetes mellitus without complications: Secondary | ICD-10-CM

## 2018-06-20 DIAGNOSIS — Z794 Long term (current) use of insulin: Secondary | ICD-10-CM

## 2018-06-20 DIAGNOSIS — I5032 Chronic diastolic (congestive) heart failure: Secondary | ICD-10-CM | POA: Diagnosis not present

## 2018-06-20 DIAGNOSIS — Z7189 Other specified counseling: Secondary | ICD-10-CM | POA: Diagnosis not present

## 2018-06-20 DIAGNOSIS — I10 Essential (primary) hypertension: Secondary | ICD-10-CM | POA: Diagnosis not present

## 2018-06-20 DIAGNOSIS — R0602 Shortness of breath: Secondary | ICD-10-CM

## 2018-06-20 NOTE — Patient Instructions (Signed)
Medication Instructions:  Your Physician recommend you continue on your current medication as directed.    If you need a refill on your cardiac medications before your next appointment, please call your pharmacy.   Lab work: None  Testing/Procedures: Your physician has requested that you have an echocardiogram. Echocardiography is a painless test that uses sound waves to create images of your heart. It provides your doctor with information about the size and shape of your heart and how well your heart's chambers and valves are working. This procedure takes approximately one hour. There are no restrictions for this procedure. [address]   Follow-Up: At [facility], you and your health needs are our priority.  As part of our continuing mission to provide you with exceptional heart care, we have created designated Provider Care Teams.  These Care Teams include your primary Cardiologist (physician) and Advanced Practice Providers (APPs -  Physician Assistants and Nurse Practitioners) who all work together to provide you with the care you need, when you need it. You will need a follow up appointment in 3 months.  Please call our office 2 months in advance to schedule this appointment.  You may see Dr. Darlin Drop or one of the following Advanced Practice Providers on your designated Care Team:   Theodore Demark, PA-C . Joni Reining, DNP, ANP

## 2018-06-20 NOTE — Progress Notes (Signed)
Cardiology Office Note:    Date:  06/20/2018   ID:  Teresa Chang, DOB 1970-08-29, MRN [id number]  PCP:  Marjie Skiff, MD  Cardiologist:  Buford Dresser, MD PhD  Referring MD: Marjie Skiff, MD   CC: follow up, recent diabetes diagnosis  History of Present Illness:    Teresa Chang is a 48 y.o. female with a hx of hypertension and lower extremity swelling who is seen in follow up. She was initially seen as a new patient on 11/18/17 at the request of Dr. Andy Gauss for evaluation and management of lower extremity edema.   Cardiac history: Noted a 1 year history of weight gain. Was 170-180lbs, now weighs up to 220 lbs. Came on gradually. After taking HCTZ swelling improved but her clothes are still tight. Was on BP meds in the past (lisinopril-HCTZ) but stopped after she began exercising, was off medications for about a year until she had to restart HCTZ. Reviewed the results of her echo with her, which showed diastolic dysfunction.  Today: Was diagnosed with diabetes the day after Christmas. Initial A1c >15. Most recent A1c was 8.9. Has felt short of breath, dizzy with treatment. Can't walk for long until she has to stop. Sometimes feels tired/short of breath just tying her shoes. Fatigue is bothersome. No chest pain, no shortness of breath at rest. No syncope. Stopped lasix now that she started jardiance. Tolerating her current meds well overall.  Past Medical History:  Diagnosis Date  . Anxiety   . Bipolar disorder ([place])   . Depression   . Hypertension     No past surgical history on file.  Current Medications: Current Outpatient Medications on File Prior to Visit  Medication Sig  . Blood Glucose Monitoring Suppl (ACCU-CHEK AVIVA PLUS) w/Device KIT Use daily  . empagliflozin (JARDIANCE) 25 MG TABS tablet Take 25 mg by mouth daily.  Marland Kitchen glucose blood (ACCU-CHEK AVIVA) test strip Use as instructed  . insulin aspart (NOVOLOG) 100 UNIT/ML FlexPen Inject 5 Units into the  skin 2 (two) times daily.  . Insulin Glargine (LANTUS SOLOSTAR) 100 UNIT/ML Solostar Pen Inject 14 Units into the skin daily.  . Insulin Pen Needle (PEN NEEDLES) 32G X 4 MM MISC 20 Units by Does not apply route daily.  . Lancets (ACCU-CHEK MULTICLIX) lancets Use as instructed  . lisinopril (PRINIVIL,ZESTRIL) 40 MG tablet Take 1 tablet (40 mg total) by mouth daily.  . metFORMIN (GLUCOPHAGE-XR) 500 MG 24 hr tablet Take 2 tablets (1,000 mg total) by mouth 2 (two) times daily.  . rosuvastatin (CRESTOR) 20 MG tablet Take 1 tablet (20 mg total) by mouth daily.   No current facility-administered medications on file prior to visit.      Allergies:   Patient has no known allergies.   Social History   Socioeconomic History  . Marital status: Single    Spouse name: Not on file  . Number of children: Not on file  . Years of education: Not on file  . Highest education level: Not on file  Occupational History  . Not on file  Social Needs  . Financial resource strain: Not on file  . Food insecurity:    Worry: Not on file    Inability: Not on file  . Transportation needs:    Medical: Not on file    Non-medical: Not on file  Tobacco Use  . Smoking status: Former Smoker    Packs/day: 0.30  . Smokeless tobacco: Never Used  Substance and Sexual Activity  .  Alcohol use: No  . Drug use: No  . Sexual activity: Not on file  Lifestyle  . Physical activity:    Days per week: Not on file    Minutes per session: Not on file  . Stress: Not on file  Relationships  . Social connections:    Talks on phone: Not on file    Gets together: Not on file    Attends religious service: Not on file    Active member of club or organization: Not on file    Attends meetings of clubs or organizations: Not on file    Relationship status: Not on file  Other Topics Concern  . Not on file  Social History Narrative  . Not on file   Quit smoking 3 years ago. Does not do routine exercise currently. .  Family  History: The patient's family history includes Stroke in her maternal grandmother.  ROS:   Please see the history of present illness.  Additional pertinent ROS: Review of Systems  Constitutional: Positive for malaise/fatigue. Negative for chills and fever.  HENT: Negative for ear pain and hearing loss.   Eyes: Negative for blurred vision and pain.  Respiratory: Negative for cough, hemoptysis, sputum production and shortness of breath.   Cardiovascular: Negative for chest pain, palpitations, orthopnea, claudication, leg swelling and PND.  Gastrointestinal: Negative for abdominal pain, blood in stool and melena.  Genitourinary: Negative for dysuria and hematuria.  Musculoskeletal: Negative for joint pain and myalgias.  Skin: Negative for itching and rash.  Neurological: Negative for focal weakness and loss of consciousness.  Endo/Heme/Allergies: Does not bruise/bleed easily.   EKGs/Labs/Other Studies Reviewed:    The following studies were personally reviewed today: Echo 10/25/17 Left ventricle: The cavity size was normal. There was mild   concentric hypertrophy. Systolic function was normal. The   estimated ejection fraction was in the range of 60% to 65%. Wall   motion was normal; there were no regional wall motion   abnormalities. Features are consistent with a pseudonormal left   ventricular filling pattern, with concomitant abnormal relaxation   and increased filling pressure (grade 2 diastolic dysfunction). - Mitral valve: There was mild regurgitation.  EKG:  EKG is not ordered today.  The ekg ordered 11/18/17 demonstrates normal sinus rhythm  Recent Labs: 10/12/2017: BNP 22.3; Hemoglobin 11.4; Platelets 265; TSH 0.677 05/15/2018: ALT 6; BUN 11; Creatinine, Ser 0.95; Potassium 4.0; Sodium 141  Recent Lipid Panel    Component Value Date/Time   CHOL 216 (H) 05/15/2018 1143   TRIG 113 05/15/2018 1143   HDL 53 05/15/2018 1143   CHOLHDL 4.1 05/15/2018 1143   CHOLHDL 4.3 05/25/2016  1620   VLDL 39 (H) 05/25/2016 1620   LDLCALC 140 (H) 05/15/2018 1143    Physical Exam:    VS:  BP 119/75   Pulse 77   Ht 5' 8"  (1.727 m)   Wt 191 lb (86.6 kg)   BMI 29.04 kg/m     Wt Readings from Last 3 Encounters:  06/20/18 191 lb (86.6 kg)  06/15/18 190 lb 4.8 oz (86.3 kg)  06/02/18 185 lb (83.9 kg)     GEN: Well nourished, well developed in no acute distress HEENT: Normal NECK: No JVD; No carotid bruits LYMPHATICS: No lymphadenopathy CARDIAC: regular rhythm, normal S1 and S2, no murmurs, rubs, gallops. Radial and DP pulses 2+ bilaterally. RESPIRATORY:  Clear to auscultation without rales, wheezing or rhonchi  ABDOMEN: Soft, non-tender, non-distended MUSCULOSKELETAL:  No lower extremity bilateral edema; No deformity  SKIN: Warm and dry NEUROLOGIC:  Alert and oriented x 3 PSYCHIATRIC:  Normal affect   ASSESSMENT:    1. SOB (shortness of breath)   2. Essential hypertension, benign   3. Type 2 diabetes mellitus without complication, with long-term current use of insulin (HCC)   4. Cardiac risk counseling   5. Counseling on health promotion and disease prevention   6. Chronic diastolic heart failure (HCC) Chronic   PLAN:    Shortness of breath: mostly exertional, though sometimes with minimal movement. Discussed options of repeating echo (last one normal other than diastolic dysfunction) vs. Treadmill nuclear stress test. She would like to pursue echo first. If symptoms persist or echo with new changes, will pursue treadmill nuclear.  Chronic diastolic heart failure: Echo shows grade II diastolic dysfunction. Euvolemic today -as she is now on jardiance, will discontinue furosemide (she has not been taking)  Recent diagnosis of type II diabetes, now on insulin: per PCP. A1c initially >15, now 8.9 -agree with jardiance also from CV perspective -on metformin and insulin as well -has had rare lows in the 70s, treats with juice  CV risk and prevention -recommend heart  healthy/Mediterranean diet, with whole grains, fruits, vegetable, fish, lean meats, nuts, and olive oil. Limit salt. -recommend moderate walking, 3-5 times/week for 30-50 minutes each session. Aim for at least 150 minutes.week. Goal should be pace of 3 miles/hours, or walking 1.5 miles in 30 minutes -recommend avoidance of tobacco products. Avoid excess alcohol. -Additional risk factor control:  -Diabetes: A1c is 8.9  -Lipids: LDL 140, TG 113. On rosuvastatin 20 mg  -Blood pressure control: Goal <130/80, at goal on lisinopril  -Weight: BMI 29, working on lifestyle change -ASCVD risk score: The 10-year ASCVD risk score Mikey Bussing DC Brooke Bonito., et al., 2013) is: 6.3%   Values used to calculate the score:     Age: 63 years     Sex: Female     Is Non-Hispanic African American: Yes     Diabetic: Yes     Tobacco smoker: No     Systolic Blood Pressure: 222 mmHg     Is BP treated: Yes     HDL Cholesterol: 53 mg/dL     Total Cholesterol: 216 mg/dL    Plan for follow up: 3 mos to monitor symptoms  TIME SPENT WITH PATIENT: 25 minutes of direct patient care. More than 50% of that time was spent on coordination of care and counseling regarding CV risk with diabetes, diagnosis and treatment.  Buford Dresser, MD, PhD [place]  [facility]   Medication Adjustments/Labs and Tests Ordered: Current medicines are reviewed at length with the patient today.  Concerns regarding medicines are outlined above.  Orders Placed This Encounter  Procedures  . ECHOCARDIOGRAM COMPLETE   No orders of the defined types were placed in this encounter.   Patient Instructions  Medication Instructions:  Your Physician recommend you continue on your current medication as directed.    If you need a refill on your cardiac medications before your next appointment, please call your pharmacy.   Lab work: None  Testing/Procedures: Your physician has requested that you have an echocardiogram. Echocardiography is  a painless test that uses sound waves to create images of your heart. It provides your doctor with information about the size and shape of your heart and how well your heart's chambers and valves are working. This procedure takes approximately one hour. There are no restrictions for this procedure. [place] [address]  Follow-Up: At [facility], you and your health needs are our priority.  As part of our continuing mission to provide you with exceptional heart care, we have created designated Provider Care Teams.  These Care Teams include your primary Cardiologist (physician) and Advanced Practice Providers (APPs -  Physician Assistants and Nurse Practitioners) who all work together to provide you with the care you need, when you need it. You will need a follow up appointment in 3 months.  Please call our office 2 months in advance to schedule this appointment.  You may see Dr. Claudia Desanctis or one of the following Advanced Practice Providers on your designated Care Team:   Rosaria Ferries, PA-C . Jory Sims, DNP, ANP        Signed, Buford Dresser, MD PhD 06/20/2018 5:28 PM    [place]

## 2018-06-22 DIAGNOSIS — Z794 Long term (current) use of insulin: Secondary | ICD-10-CM | POA: Diagnosis not present

## 2018-06-22 DIAGNOSIS — H401112 Primary open-angle glaucoma, right eye, moderate stage: Secondary | ICD-10-CM | POA: Diagnosis not present

## 2018-06-22 DIAGNOSIS — E119 Type 2 diabetes mellitus without complications: Secondary | ICD-10-CM | POA: Diagnosis not present

## 2018-06-22 DIAGNOSIS — H2512 Age-related nuclear cataract, left eye: Secondary | ICD-10-CM | POA: Diagnosis not present

## 2018-06-22 DIAGNOSIS — Z961 Presence of intraocular lens: Secondary | ICD-10-CM | POA: Diagnosis not present

## 2018-06-23 ENCOUNTER — Ambulatory Visit: Payer: Medicaid Other | Admitting: Pharmacist

## 2018-06-28 ENCOUNTER — Ambulatory Visit: Payer: Medicaid Other | Admitting: Family Medicine

## 2018-06-29 ENCOUNTER — Ambulatory Visit (HOSPITAL_COMMUNITY): Payer: Medicaid Other | Attending: Cardiology

## 2018-07-18 DIAGNOSIS — H5213 Myopia, bilateral: Secondary | ICD-10-CM | POA: Diagnosis not present

## 2018-07-27 ENCOUNTER — Ambulatory Visit: Payer: Medicaid Other | Admitting: Registered"

## 2018-07-31 ENCOUNTER — Encounter: Payer: Self-pay | Admitting: Family Medicine

## 2018-08-23 ENCOUNTER — Ambulatory Visit: Payer: Medicaid Other | Admitting: Family Medicine

## 2018-08-30 DIAGNOSIS — H524 Presbyopia: Secondary | ICD-10-CM | POA: Diagnosis not present

## 2018-09-06 ENCOUNTER — Other Ambulatory Visit: Payer: Self-pay

## 2018-09-06 ENCOUNTER — Ambulatory Visit (HOSPITAL_COMMUNITY)
Admission: RE | Admit: 2018-09-06 | Discharge: 2018-09-06 | Disposition: A | Discharge status: Home / Self Care | Payer: Medicaid Other | Source: Ambulatory Visit | Attending: Cardiology | Admitting: Cardiology

## 2018-09-06 ENCOUNTER — Telehealth: Payer: Self-pay | Admitting: *Deleted

## 2018-09-06 DIAGNOSIS — Z87891 Personal history of nicotine dependence: Secondary | ICD-10-CM | POA: Insufficient documentation

## 2018-09-06 DIAGNOSIS — E119 Type 2 diabetes mellitus without complications: Secondary | ICD-10-CM | POA: Insufficient documentation

## 2018-09-06 DIAGNOSIS — I10 Essential (primary) hypertension: Secondary | ICD-10-CM | POA: Insufficient documentation

## 2018-09-06 DIAGNOSIS — R0602 Shortness of breath: Secondary | ICD-10-CM | POA: Diagnosis not present

## 2018-09-06 NOTE — Progress Notes (Signed)
  Echocardiogram 2D Echocardiogram has been performed.  Adine Madura 09/06/2018, 3:58 PM

## 2018-09-06 NOTE — Telephone Encounter (Signed)
Pt calls she needs a letter and wanted to give Dr. Sydnee Cabal a heads up before her appt tomorrow.  Basically she needs a letter stating that her recent medical conditions (DM and acute heart failure) played a part in her failing grades which caused her to be suspended from school.  She will not be allowed to resume classes without this letter.  Jone Baseman, CMA

## 2018-09-07 ENCOUNTER — Other Ambulatory Visit: Payer: Self-pay

## 2018-09-07 ENCOUNTER — Ambulatory Visit (INDEPENDENT_AMBULATORY_CARE_PROVIDER_SITE_OTHER): Payer: Medicaid Other | Admitting: Family Medicine

## 2018-09-07 ENCOUNTER — Encounter: Payer: Self-pay | Admitting: Family Medicine

## 2018-09-07 ENCOUNTER — Telehealth: Payer: Self-pay

## 2018-09-07 VITALS — BP 100/60 | HR 99 | Ht 68.0 in | Wt 193.4 lb

## 2018-09-07 DIAGNOSIS — I10 Essential (primary) hypertension: Secondary | ICD-10-CM | POA: Diagnosis not present

## 2018-09-07 DIAGNOSIS — E782 Mixed hyperlipidemia: Secondary | ICD-10-CM

## 2018-09-07 DIAGNOSIS — R7989 Other specified abnormal findings of blood chemistry: Secondary | ICD-10-CM | POA: Diagnosis not present

## 2018-09-07 DIAGNOSIS — E119 Type 2 diabetes mellitus without complications: Secondary | ICD-10-CM | POA: Diagnosis not present

## 2018-09-07 LAB — POCT GLYCOSYLATED HEMOGLOBIN (HGB A1C): HbA1c, POC (controlled diabetic range): 5.8 % (ref 0.0–7.0)

## 2018-09-07 MED ORDER — ROSUVASTATIN CALCIUM 20 MG PO TABS: 20.0000 mg | ORAL_TABLET | Freq: Every day | ORAL | 3 refills | Status: DC

## 2018-09-07 NOTE — Progress Notes (Signed)
   Subjective:    Patient ID: Teresa Chang, female    DOB: Mar 03, 1971, 48 y.o.   MRN: [id number]   CC: Follow-up for type 2 diabetes  HPI: Patient is a 48 year old female presenting for type 2 diabetes, systolic heart failure, Hypertension, hyperlipidemia who presents today to follow-up on diabetes management.  Patient reports that she is currently taking 40 units of Lantus daily and 5 units of NovoLog once a day (at lunch).  Patient is also on 25 mg Jardiance 1000 mg metformin.  Patient did not bring glucometer today but reports that most of her morning blood glucose have been close to 120.  She denies any polyuria polydipsia.  Patient has been also trying to be more active and more careful with her diet.  Patient denies any chest pain, fever, abdominal pain, nausea, vomiting.  Smoking status reviewed   ROS: all other systems were reviewed and are negative other than in the HPI   Past Medical History:  Diagnosis Date  . Anxiety   . Bipolar disorder (HCC)   . Depression   . Hypertension     History reviewed. No pertinent surgical history.  Past medical history, surgical, family, and social history reviewed and updated in the EMR as appropriate.  Objective:  BP 100/60   Pulse 99   Ht 5\' 8"  (1.727 m)   Wt 193 lb 6 oz (87.7 kg)   LMP 09/07/2018 Comment: currently  SpO2 97%   BMI 29.40 kg/m   Vitals and nursing note reviewed  General: NAD, pleasant, able to participate in exam Cardiac: RRR, normal heart sounds, no murmurs. 2+ radial and PT pulses bilaterally Respiratory: CTAB, normal effort, No wheezes, rales or rhonchi Abdomen: soft, nontender, nondistended, no hepatic or splenomegaly, +BS Extremities: no edema or cyanosis. WWP. Skin: warm and dry, no rashes noted Neuro: alert and oriented x4, no focal deficits Psych: Normal affect and mood   Assessment & Plan:   Type 2 diabetes mellitus without complication, without long-term current use of insulin (HCC) A1c is 5.8  down from 8.9.  Significant improvement since last office visit.  Given extent of current regimen will make some changes.  Decrease Lantus to 10 units from 14.  Continue with NovoLog 5 units once a day.  Continue Jardiance 25 mg daily.  Continue metformin 1000 mg XR once a day.  We will recheck A1c in 3 months.  Referral to ophthalmology placed.  Mixed hyperlipidemia Continue Crestor 20 mg daily.  Patient will need repeat lipid panel next year.  Essential hypertension, benign At goal continue lisinopril.    Lovena Neighbours, MD [facility] PGY-3

## 2018-09-07 NOTE — Assessment & Plan Note (Signed)
At goal continue lisinopril.

## 2018-09-07 NOTE — Assessment & Plan Note (Signed)
Continue Crestor 20 mg daily.  Patient will need repeat lipid panel next year.

## 2018-09-07 NOTE — Patient Instructions (Signed)
It was great seeing you today! We have addressed the following issues today  1. I will increase your crestor to 40 mg daily. 2. Decrease your Lantus to 10u from 14u. 3. Keep working on diet and exercise. 4. I will follow up on you vitamin D level.  If we did any lab work today, and the results require attention, either me or my nurse will get in touch with you. If everything is normal, you will get a letter in mail and a message via . If you don't hear from [place] in two weeks, please give [place] a call. Otherwise, we look forward to seeing you again at your next visit. If you have any questions or concerns before then, please call the clinic at [phone].  Please bring all your medications to every doctors visit  Sign up for My Chart to have easy access to your labs results, and communication with your Primary care physician. Please ask Front Desk for some assistance.   Please check-out at the front desk before leaving the clinic.    Take Care,   Dr. Sydnee Cabal

## 2018-09-07 NOTE — Telephone Encounter (Signed)
Virtual Visit Pre-Appointment Phone Call  "(Name), I am calling you today to discuss your upcoming appointment. We are currently trying to limit exposure to the virus that causes COVID-19 by seeing patients at home rather than in the office."  1. "What is the BEST phone number to call the day of the visit?" - include this in appointment notes  2. "Do you have or have access to (through a family member/friend) a smartphone with video capability that we can use for your visit?" a. If yes - list this number in appt notes as "cell" (if different from BEST phone #) and list the appointment type as a VIDEO visit in appointment notes b. If no - list the appointment type as a PHONE visit in appointment notes  3. Confirm consent - "In the setting of the current Covid19 crisis, you are scheduled for a (phone or video) visit with your provider on (date) at (time).  Just as we do with many in-office visits, in order for you to participate in this visit, we must obtain consent.  If you'd like, I can send this to your mychart (if signed up) or email for you to review.  Otherwise, I can obtain your verbal consent now.  All virtual visits are billed to your insurance company just like a normal visit would be.  By agreeing to a virtual visit, we'd like you to understand that the technology does not allow for your provider to perform an examination, and thus may limit your provider's ability to fully assess your condition. If your provider identifies any concerns that need to be evaluated in person, we will make arrangements to do so.  Finally, though the technology is pretty good, we cannot assure that it will always work on either your or our end, and in the setting of a video visit, we may have to convert it to a phone-only visit.  In either situation, we cannot ensure that we have a secure connection.  Are you willing to proceed?" STAFF: Did the patient verbally acknowledge consent to telehealth visit? Document  YES/NO here: Yes  4. Advise patient to be prepared - "Two hours prior to your appointment, go ahead and check your blood pressure, pulse, oxygen saturation, and your weight (if you have the equipment to check those) and write them all down. When your visit starts, your provider will ask you for this information. If you have an Apple Watch or Kardia device, please plan to have heart rate information ready on the day of your appointment. Please have a pen and paper handy nearby the day of the visit as well."  5. Give patient instructions for MyChart download to smartphone OR Doximity/Doxy.me as below if video visit (depending on what platform provider is using)  6. Inform patient they will receive a phone call 15 minutes prior to their appointment time (may be from unknown caller ID) so they should be prepared to answer    TELEPHONE CALL NOTE  Teresa Chang has been deemed a candidate for a follow-up tele-health visit to limit community exposure during the Covid-19 pandemic. I spoke with the patient via phone to ensure availability of phone/video source, confirm preferred email & phone number, and discuss instructions and expectations.  I reminded Teresa Chang to be prepared with any vital sign and/or heart rhythm information that could potentially be obtained via home monitoring, at the time of her visit. I reminded Teresa Chang to expect a phone call prior to  her visit.  Parke Poisson, RN 09/07/2018 1:44 PM   INSTRUCTIONS FOR DOWNLOADING THE MYCHART APP TO SMARTPHONE  - The patient must first make sure to have activated MyChart and know their login information - If Apple, go to [employer] and type in MyChart in the search bar and download the app. If Android, ask patient to go to [employer] and type in [place] in the search bar and download the app. The app is free but as with any other app downloads, their phone may require them to verify saved payment information or Apple/Android  password.  - The patient will need to then log into the app with their MyChart username and password, and select [place] as their healthcare provider to link the account. When it is time for your visit, go to the MyChart app, find appointments, and click Begin Video Visit. Be sure to Select Allow for your device to access the Microphone and Camera for your visit. You will then be connected, and your provider will be with you shortly.  **If they have any issues connecting, or need assistance please contact MyChart service desk [phone])**  **If using a computer, in order to ensure the best quality for their visit they will need to use either of the following Internet Browsers: [employer] or Google Chrome**  IF USING DOXIMITY or DOXY.ME - The patient will receive a link just prior to their visit by text.     FULL LENGTH CONSENT FOR TELE-HEALTH VISIT   I hereby voluntarily request, consent and authorize [facility] and its employed or contracted physicians, physician assistants, nurse practitioners or other licensed health care professionals (the Practitioner), to provide me with telemedicine health care services (the "Services") as deemed necessary by the treating Practitioner. I acknowledge and consent to receive the Services by the Practitioner via telemedicine. I understand that the telemedicine visit will involve communicating with the Practitioner through live audiovisual communication technology and the disclosure of certain medical information by electronic transmission. I acknowledge that I have been given the opportunity to request an in-person assessment or other available alternative prior to the telemedicine visit and am voluntarily participating in the telemedicine visit.  I understand that I have the right to withhold or withdraw my consent to the use of telemedicine in the course of my care at any time, without affecting my right to future care or treatment,  and that the Practitioner or I may terminate the telemedicine visit at any time. I understand that I have the right to inspect all information obtained and/or recorded in the course of the telemedicine visit and may receive copies of available information for a reasonable fee.  I understand that some of the potential risks of receiving the Services via telemedicine include:  Marland Kitchen Delay or interruption in medical evaluation due to technological equipment failure or disruption; . Information transmitted may not be sufficient (e.g. poor resolution of images) to allow for appropriate medical decision making by the Practitioner; and/or  . In rare instances, security protocols could fail, causing a breach of personal health information.  Furthermore, I acknowledge that it is my responsibility to provide information about my medical history, conditions and care that is complete and accurate to the best of my ability. I acknowledge that Practitioner's advice, recommendations, and/or decision may be based on factors not within their control, such as incomplete or inaccurate data provided by me or distortions of diagnostic images or specimens that may result from electronic transmissions.  I understand that the practice of medicine is not an exact science and that Practitioner makes no warranties or guarantees regarding treatment outcomes. I acknowledge that I will receive a copy of this consent concurrently upon execution via email to the email address I last provided but may also request a printed copy by calling the office of [place].    I understand that my insurance will be billed for this visit.   I have read or had this consent read to me. . I understand the contents of this consent, which adequately explains the benefits and risks of the Services being provided via telemedicine.  . I have been provided ample opportunity to ask questions regarding this consent and the Services and have had my questions  answered to my satisfaction. . I give my informed consent for the services to be provided through the use of telemedicine in my medical care  By participating in this telemedicine visit I agree to the above.

## 2018-09-07 NOTE — Assessment & Plan Note (Addendum)
A1c is 5.8 down from 8.9.  Significant improvement since last office visit.  Given extent of current regimen will make some changes.  Decrease Lantus to 10 units from 14.  Continue with NovoLog 5 units once a day.  Continue Jardiance 25 mg daily.  Continue metformin 1000 mg XR once a day.  We will recheck A1c in 3 months.  Referral to ophthalmology placed.

## 2018-09-08 LAB — VITAMIN D 25 HYDROXY (VIT D DEFICIENCY, FRACTURES): Vit D, 25-Hydroxy: 12.9 ng/mL — ABNORMAL LOW (ref 30.0–100.0)

## 2018-09-11 ENCOUNTER — Telehealth: Payer: Self-pay | Admitting: Family Medicine

## 2018-09-11 ENCOUNTER — Other Ambulatory Visit: Payer: Self-pay | Admitting: Family Medicine

## 2018-09-11 DIAGNOSIS — E559 Vitamin D deficiency, unspecified: Secondary | ICD-10-CM

## 2018-09-11 MED ORDER — VITAMIN D3 1.25 MG (50000 UT) PO TABS: 1.0000 | ORAL_TABLET | ORAL | 0 refills | Status: DC

## 2018-09-11 NOTE — Telephone Encounter (Signed)
Called patient and discussed low Vitamin D level. Supplementation was sent to pharmacy for a duration of 8 weeks. She will follow up with PCP to discuss plan after completion.  Lovena Neighbours, MD [facility], PGY-3

## 2018-09-18 ENCOUNTER — Telehealth: Payer: Self-pay | Admitting: Cardiology

## 2018-09-18 NOTE — Telephone Encounter (Signed)
Phone [phone]/my chart/pre reg complete/consent obtained -- ttf

## 2018-09-25 ENCOUNTER — Telehealth: Payer: Medicaid Other | Admitting: Cardiology

## 2018-09-25 ENCOUNTER — Telehealth: Payer: Self-pay

## 2018-09-25 NOTE — Telephone Encounter (Signed)
Tired calling the patient multiple times and was getting a recorded message that the customer is unavailable.

## 2018-09-25 NOTE — Telephone Encounter (Signed)
Left a voice message for the patient to call back to the office for her virtual visit with Dr. Harrell Gave

## 2018-09-25 NOTE — Telephone Encounter (Signed)
Called the the number listed for the patient's mother Teresa Chang who is on the patient's DPR left a detailed voice message letting her know that I am trying to get in touch with her daughter who is scheduled for a virtual visit and the number listed the recording states that "the [employer] customer is temporarily unavailable please try your call again later"

## 2018-09-27 ENCOUNTER — Encounter: Payer: Self-pay | Admitting: Cardiology

## 2018-09-27 ENCOUNTER — Telehealth (INDEPENDENT_AMBULATORY_CARE_PROVIDER_SITE_OTHER): Payer: Medicaid Other | Admitting: Cardiology

## 2018-09-27 DIAGNOSIS — M7989 Other specified soft tissue disorders: Secondary | ICD-10-CM | POA: Diagnosis not present

## 2018-09-27 DIAGNOSIS — I110 Hypertensive heart disease with heart failure: Secondary | ICD-10-CM

## 2018-09-27 DIAGNOSIS — E119 Type 2 diabetes mellitus without complications: Secondary | ICD-10-CM | POA: Diagnosis not present

## 2018-09-27 DIAGNOSIS — I5032 Chronic diastolic (congestive) heart failure: Secondary | ICD-10-CM

## 2018-09-27 DIAGNOSIS — Z7189 Other specified counseling: Secondary | ICD-10-CM

## 2018-09-27 DIAGNOSIS — R6889 Other general symptoms and signs: Secondary | ICD-10-CM

## 2018-09-27 DIAGNOSIS — R0609 Other forms of dyspnea: Secondary | ICD-10-CM

## 2018-09-27 DIAGNOSIS — Z794 Long term (current) use of insulin: Secondary | ICD-10-CM

## 2018-09-27 DIAGNOSIS — I10 Essential (primary) hypertension: Secondary | ICD-10-CM

## 2018-09-27 NOTE — Patient Instructions (Addendum)
Medication Instructions:   Your physician recommends that you continue on your current medications as directed. Please refer to the Current Medication list given to you today.  If you need a refill on your cardiac medications before your next appointment, please call your pharmacy.   Lab work:  NONE ordered at this time of appointment   If you have labs (blood work) drawn today and your tests are completely normal, you will receive your results only by: Marland Kitchen MyChart Message (if you have MyChart) OR . A paper copy in the mail If you have any lab test that is abnormal or we need to change your treatment, we will call you to review the results.  Testing/Procedures:  Your physician has requested that you have a lexiscan myoview. For further information please visit [email or web address]. Please follow instruction sheet, as given.   Follow-Up: At [facility], you and your health needs are our priority.  As part of our continuing mission to provide you with exceptional heart care, we have created designated Provider Care Teams.  These Care Teams include your primary Cardiologist (physician) and Advanced Practice Providers (APPs -  Physician Assistants and Nurse Practitioners) who all work together to provide you with the care you need, when you need it. You will need a follow up appointment in 3 months.  Please call our office 2 months in advance to schedule this appointment.  You may see Buford Dresser, MD or one of the following Advanced Practice Providers on your designated Care Team:   Rosaria Ferries, PA-C . Jory Sims, DNP, ANP  Any Other Special Instructions Will Be Listed Below (If Applicable).

## 2018-09-27 NOTE — Progress Notes (Signed)
Virtual Visit via Telephone Note   This visit type was conducted due to national recommendations for restrictions regarding the COVID-19 Pandemic (e.g. social distancing) in an effort to limit this patient's exposure and mitigate transmission in our community.  Due to her co-morbid illnesses, this patient is at least at moderate risk for complications without adequate follow up.  This format is felt to be most appropriate for this patient at this time.  The patient did not have access to video technology/had technical difficulties with video requiring transitioning to audio format only (telephone).  All issues noted in this document were discussed and addressed.  No physical exam could be performed with this format.  Please refer to the patient's chart for her  consent to telehealth for [facility].   Date:  09/27/2018   ID:  Teresa Chang, DOB April 04, 1971, MRN [id number]  Patient Location: Home Provider Location: Home  PCP:  Marjie Skiff, MD  Cardiologist:  Buford Dresser, MD  Electrophysiologist:  None   Evaluation Performed:  Follow-Up Visit  Chief Complaint:  Follow up  History of Present Illness:    Teresa Chang is a 48 y.o. female with HTN, hyperlipidemia, LE edema, chronic diastolic heart failure, type II diabetes on insulin.  The patient does not have symptoms concerning for COVID-19 infection (fever, chills, cough, or new shortness of breath).   Concerns today:  Used to be able to do 30 min treadmill, sometimes more several years ago. Now can't walk for more than a few min. Feels short of breath, fatigued, like her heart was coming out of her chest. No chest pain that she is aware of.  Continues to have mild LE edema but improved. Doses lasix based on swelling. Reviewed importance of daily weights to assist with this.  Gets dizzy a lot with changing position. Not vertigo. Mostly with bending and standing up.  Also struggled with concentration since the  fall, in school, has had issues academically since all of her medical diagnoses but she is dedicated to finishing. Had been working from home. Applied for S[employer], awaiting decision.  No scale or BP cuff, looking into it. Discussed how this helps [place] monitor treatment.  Since last visit, she had echo which showed EF 50-55%, improved diastolic function. A1c has gone from >15 on diagnosis 5 mos ago to 5.8 last month.  Denies chest pain, shortness of breath at rest. No PND, orthopnea, unexpected weight gain. No syncope or palpitations.  Past Medical History:  Diagnosis Date  . Anxiety   . Bipolar disorder ([place])   . Depression   . Hypertension    No past surgical history on file.   Current Meds  Medication Sig  . Blood Glucose Monitoring Suppl (ACCU-CHEK AVIVA PLUS) w/Device KIT Use daily  . Cholecalciferol (VITAMIN D3) 1.25 MG (50000 UT) TABS Take 1 tablet by mouth once a week. For 8 weeks  . empagliflozin (JARDIANCE) 25 MG TABS tablet Take 25 mg by mouth daily.  . furosemide (LASIX) 20 MG tablet TK 1 T PO QOD  . glucose blood (ACCU-CHEK AVIVA) test strip Use as instructed  . insulin aspart (NOVOLOG) 100 UNIT/ML FlexPen Inject 5 Units into the skin 2 (two) times daily.  . Insulin Glargine (LANTUS SOLOSTAR) 100 UNIT/ML Solostar Pen Inject 14 Units into the skin daily.  . Insulin Pen Needle (PEN NEEDLES) 32G X 4 MM MISC 20 Units by Does not apply route daily.  . Lancets (ACCU-CHEK MULTICLIX) lancets Use as instructed  .  lisinopril (PRINIVIL,ZESTRIL) 40 MG tablet Take 1 tablet (40 mg total) by mouth daily.  . metFORMIN (GLUCOPHAGE-XR) 500 MG 24 hr tablet Take 2 tablets (1,000 mg total) by mouth 2 (two) times daily.  . rosuvastatin (CRESTOR) 20 MG tablet Take 1 tablet (20 mg total) by mouth daily.     Allergies:   Patient has no known allergies.   Social History   Tobacco Use  . Smoking status: Former Smoker    Packs/day: 0.30  . Smokeless tobacco: Never Used  Substance Use Topics  .  Alcohol use: No  . Drug use: No     Family Hx: The patient's family history includes Stroke in her maternal grandmother.  ROS:   Please see the history of present illness.    Constitutional: Negative for chills, fever, night sweats, unintentional weight loss  HENT: Negative for ear pain and hearing loss.   Eyes: Negative for loss of vision and eye pain.  Respiratory: Negative for cough, sputum, wheezing.   Cardiovascular: See HPI. Gastrointestinal: Negative for abdominal pain, melena, and hematochezia.  Genitourinary: Negative for dysuria and hematuria.  Musculoskeletal: Negative for falls and myalgias.  Skin: Negative for itching and rash.  Neurological: Negative for focal weakness, focal sensory changes and loss of consciousness.  Endo/Heme/Allergies: Does not bruise/bleed easily.  All other systems reviewed and are negative.  Prior CV studies:   The following studies were reviewed today: 2 prior echoes reviewed  Labs/Other Tests and Data Reviewed:    EKG:  An ECG dated 11/18/17 was personally reviewed today and demonstrated:  NSR  Recent Labs: 10/12/2017: BNP 22.3; Hemoglobin 11.4; Platelets 265; TSH 0.677 05/15/2018: ALT 6; BUN 11; Creatinine, Ser 0.95; Potassium 4.0; Sodium 141   Recent Lipid Panel Lab Results  Component Value Date/Time   CHOL 216 (H) 05/15/2018 11:43 AM   TRIG 113 05/15/2018 11:43 AM   HDL 53 05/15/2018 11:43 AM   CHOLHDL 4.1 05/15/2018 11:43 AM   CHOLHDL 4.3 05/25/2016 04:20 PM   LDLCALC 140 (H) 05/15/2018 11:43 AM    Wt Readings from Last 3 Encounters:  09/07/18 193 lb 6 oz (87.7 kg)  06/20/18 191 lb (86.6 kg)  06/15/18 190 lb 4.8 oz (86.3 kg)     Objective:    Vital Signs:  LMP 09/07/2018 Comment: currently   Speaking comfortably on the phone, in no acute distress  ASSESSMENT & PLAN:    Exertional shortness of breath, limiting: with her history of diabetes (especially very high initial A1c), this is concerning for an anginal equivalent.   -we repeated echo, EF 50-55%, diastolic dysfunction improved compared to prior -given her symptoms and her diabetes, we will pursue a treadmill nuclear stress test for both exercise capacity and ischemia imaging.  Chronic diastolic heart failure: stable -discussed daily weights, she plans to order a scale -taking furosemide as needed for swelling. Instructed that if the frequency increases, she should contact me -reviewed HF education, including weights, sodium, medications, activity, watching fluid  Hypertension: recent PCP visit was well controlled to on the low side (100/60). This may be causing her postural symptoms -goal <130/80 -orthostatics at next appt -no reading today (no home cuff) -planning to get BP cuff for home. Instructed on how to take readings. -continue lisinopril 40 mg daily for now, may need to cut back if home numbers are low  Type II diabetes, now on insulin: recent diagnosis, but A1c went from >15 to 5.8 in several months -on jardiance, insulin, and metformin -on rosuvastatin 20   mg for CAD risk with diabetes. Monitor lipids  COVID-19 Education: The signs and symptoms of COVID-19 were discussed with the patient and how to seek care for testing (follow up with PCP or arrange E-visit).  The importance of social distancing was discussed today.  Time:   Today, I have spent 31 minutes with the patient with telehealth technology discussing the above problems.    Patient Instructions  Medication Instructions:   Your physician recommends that you continue on your current medications as directed. Please refer to the Current Medication list given to you today.  If you need a refill on your cardiac medications before your next appointment, please call your pharmacy.   Lab work:  NONE ordered at this time of appointment   If you have labs (blood work) drawn today and your tests are completely normal, you will receive your results only by: . MyChart Message (if you  have MyChart) OR . A paper copy in the mail If you have any lab test that is abnormal or we need to change your treatment, we will call you to review the results.  Testing/Procedures:  Your physician has requested that you have a lexiscan myoview. For further information please visit [email or web address]. Please follow instruction sheet, as given.   Follow-Up: At [facility], you and your health needs are our priority.  As part of our continuing mission to provide you with exceptional heart care, we have created designated Provider Care Teams.  These Care Teams include your primary Cardiologist (physician) and Advanced Practice Providers (APPs -  Physician Assistants and Nurse Practitioners) who all work together to provide you with the care you need, when you need it. You will need a follow up appointment in 3 months.  Please call our office 2 months in advance to schedule this appointment.  You may see Bridgette Christopher, MD or one of the following Advanced Practice Providers on your designated Care Team:   Rhonda Barrett, PA-C . Kathryn Lawrence, DNP, ANP  Any Other Special Instructions Will Be Listed Below (If Applicable).    Medication Adjustments/Labs and Tests Ordered: Current medicines are reviewed at length with the patient today.  Concerns regarding medicines are outlined above.   Tests Ordered: Orders Placed This Encounter  Procedures  . MYOCARDIAL PERFUSION IMAGING   Medication Changes: No orders of the defined types were placed in this encounter.  Follow Up:  3 mos  Signed, Bridgette Christopher, MD  09/27/2018 9:18 AM    [facility] 

## 2018-09-28 ENCOUNTER — Telehealth: Payer: Self-pay | Admitting: *Deleted

## 2018-09-28 ENCOUNTER — Other Ambulatory Visit: Payer: Self-pay | Admitting: Family Medicine

## 2018-09-28 MED ORDER — FUROSEMIDE 20 MG PO TABS: ORAL_TABLET | ORAL | 0 refills | Status: DC

## 2018-09-28 NOTE — Telephone Encounter (Signed)
Will refill her furosemide  Marjie Skiff, MD [place], PGY-3

## 2018-09-28 NOTE — Telephone Encounter (Signed)
Patient states that she needs a refill on her furosemide.  She said that the cardiologist took her off but Dr. Andy Gauss said that she could restart because of her swelling.  To [place], CMA

## 2018-10-06 ENCOUNTER — Ambulatory Visit: Payer: Medicaid Other | Admitting: Cardiology

## 2018-10-18 ENCOUNTER — Telehealth (HOSPITAL_COMMUNITY): Payer: Self-pay | Admitting: *Deleted

## 2018-10-18 NOTE — Telephone Encounter (Signed)
Close encounter 

## 2018-10-19 ENCOUNTER — Ambulatory Visit (HOSPITAL_COMMUNITY)
Admission: RE | Admit: 2018-10-19 | Payer: Medicaid Other | Source: Ambulatory Visit | Attending: Cardiology | Admitting: Cardiology

## 2018-10-30 ENCOUNTER — Other Ambulatory Visit: Payer: Self-pay | Admitting: *Deleted

## 2018-10-30 DIAGNOSIS — I5032 Chronic diastolic (congestive) heart failure: Secondary | ICD-10-CM

## 2018-10-30 DIAGNOSIS — E559 Vitamin D deficiency, unspecified: Secondary | ICD-10-CM

## 2018-11-01 MED ORDER — FUROSEMIDE 20 MG PO TABS: ORAL_TABLET | ORAL | 2 refills | Status: DC

## 2018-11-01 NOTE — Telephone Encounter (Signed)
Patient called nurse line upset her medications still haven't been sent into the pharmacy. Please advise.

## 2018-11-02 ENCOUNTER — Telehealth: Payer: Self-pay | Admitting: *Deleted

## 2018-11-02 NOTE — Telephone Encounter (Signed)
Pt calls because she is having trouble picking up her furosemide.  Her insurance will not pay for it for 16 more days.  While we were talking I realized that she had been taking her furosemide every day instead of every other day.  Pt states that she is still having swelling so thinks she may need an increase in meds.   Pt agreeable to appt with Dr. Ky Barban ( new PCP) tomorrow.  In the meantime, she will sit with feet elevated above heart.  Christen Bame, CMA

## 2018-11-03 ENCOUNTER — Other Ambulatory Visit: Payer: Self-pay

## 2018-11-03 ENCOUNTER — Telehealth: Payer: Medicaid Other | Admitting: Family Medicine

## 2018-11-03 NOTE — Progress Notes (Signed)
Called several times for virtual visit, no answer.

## 2018-11-07 ENCOUNTER — Telehealth (HOSPITAL_COMMUNITY): Payer: Self-pay | Admitting: *Deleted

## 2018-11-07 NOTE — Telephone Encounter (Signed)
Close encounter 

## 2018-11-09 ENCOUNTER — Ambulatory Visit (HOSPITAL_COMMUNITY)
Admission: RE | Admit: 2018-11-09 | Payer: Medicaid Other | Source: Ambulatory Visit | Attending: Cardiology | Admitting: Cardiology

## 2018-11-20 ENCOUNTER — Telehealth (HOSPITAL_COMMUNITY): Payer: Self-pay

## 2018-11-20 NOTE — Telephone Encounter (Signed)
FYI

## 2018-11-20 NOTE — Telephone Encounter (Signed)
New messages  Just an FYI. We have made several attempts to contact this patient including sending a letter to schedule or reschedule their [place] . We will be removing the patient from the WQ.   Thank you 8.10.20 @ 3:25pm both # are the same  lm on home vm geisla  8.4.20 @ 11:06am lm on home vm Zitlali Primm 7.30.20 no show - Eutha Cude  7.13.20 @ 10:56am lm on home vm - Aubrii Sharpless 7.9.20 no show Blaire Palomino

## 2019-02-26 ENCOUNTER — Telehealth: Payer: Self-pay | Admitting: *Deleted

## 2019-02-26 NOTE — Telephone Encounter (Signed)
LM for patient to call back.  Please assist her in making an appt for her diabetes and also a flu shot if she is interested.  Jazmin Hartsell,CMA

## 2019-02-26 NOTE — Telephone Encounter (Signed)
Pt returned call.  Appt made for Wednesday (ATC clinic)  Appt made in ATC clinic because Dr. Ky Barban is the provider that day.  Pt is requesting an updated letter for her needing additional time for test at school (see letter from Dr. Andy Gauss).  She is close to "being kicked out".  Dr. Ky Barban not available otherwise until December and pt is aware that she will need to be seen by new provider before that letter can be written. Christen Bame, CMA

## 2019-02-28 ENCOUNTER — Other Ambulatory Visit: Payer: Self-pay

## 2019-02-28 ENCOUNTER — Ambulatory Visit (INDEPENDENT_AMBULATORY_CARE_PROVIDER_SITE_OTHER): Payer: Medicaid Other | Admitting: Family Medicine

## 2019-02-28 ENCOUNTER — Telehealth: Payer: Self-pay | Admitting: Cardiology

## 2019-02-28 VITALS — BP 102/58 | HR 98 | Ht 68.0 in | Wt 197.5 lb

## 2019-02-28 DIAGNOSIS — E119 Type 2 diabetes mellitus without complications: Secondary | ICD-10-CM | POA: Diagnosis not present

## 2019-02-28 DIAGNOSIS — Z6831 Body mass index (BMI) 31.0-31.9, adult: Secondary | ICD-10-CM | POA: Diagnosis not present

## 2019-02-28 DIAGNOSIS — E559 Vitamin D deficiency, unspecified: Secondary | ICD-10-CM

## 2019-02-28 DIAGNOSIS — E669 Obesity, unspecified: Secondary | ICD-10-CM | POA: Diagnosis not present

## 2019-02-28 DIAGNOSIS — H9202 Otalgia, left ear: Secondary | ICD-10-CM | POA: Diagnosis not present

## 2019-02-28 DIAGNOSIS — Z283 Underimmunization status: Secondary | ICD-10-CM | POA: Diagnosis not present

## 2019-02-28 DIAGNOSIS — Z111 Encounter for screening for respiratory tuberculosis: Secondary | ICD-10-CM | POA: Diagnosis not present

## 2019-02-28 DIAGNOSIS — Z2839 Other underimmunization status: Secondary | ICD-10-CM

## 2019-02-28 DIAGNOSIS — I10 Essential (primary) hypertension: Secondary | ICD-10-CM

## 2019-02-28 DIAGNOSIS — H9209 Otalgia, unspecified ear: Secondary | ICD-10-CM | POA: Insufficient documentation

## 2019-02-28 LAB — POCT GLYCOSYLATED HEMOGLOBIN (HGB A1C): HbA1c, POC (controlled diabetic range): 11.8 % — AB (ref 0.0–7.0)

## 2019-02-28 LAB — POCT UA - MICROALBUMIN
Creatinine, POC: 50 mg/dL
Microalbumin Ur, POC: 10 mg/L

## 2019-02-28 MED ORDER — LISINOPRIL 20 MG PO TABS: 20.0000 mg | ORAL_TABLET | Freq: Every day | ORAL | 0 refills | Status: DC

## 2019-02-28 MED ORDER — LANTUS SOLOSTAR 100 UNIT/ML ~~LOC~~ SOPN: 20.0000 [IU] | PEN_INJECTOR | Freq: Every day | SUBCUTANEOUS | 99 refills | Status: DC

## 2019-02-28 NOTE — Assessment & Plan Note (Signed)
No evidence of infection on exam and is without symptoms of hearing loss. Low suspicion for infection, Meniere's disease, tumor. Slight excoriations to pinna, suspect irritation stemming from this. Recommended tylenol, ibuprofen as needed. F/u if no better in a few days, not relieved with OTC pain control or if develops additional discharge, fevers.

## 2019-02-28 NOTE — Progress Notes (Signed)
Subjective:   Patient ID: Teresa Chang    DOB: [birth year], 48 y.o. female   MRN: [id number]  Teresa Chang is a 48 y.o. female with a history of HTN, diastolic heart failure, J1BJ, OA, obesity, anxiety, tobacco use, MDD, HLD here for   Diabetes, Type 2 - Last A1c 5.8 09/07/2018 - Medications: Jardiance 25 mg, NovoLog 5 units twice daily, Lantus 14 units daily, Metformin 1000 mg twice daily - Compliance: did have to take more insulin due to higher CBGs. Taking 10u Novolog once per day in morning with food. Lantus 20u in the morning.  - Checking BG at home: yes, CBG too high a few weeks ago, came down to 300s, the past few days 100-200s. - Diet: "too many potatoes and rice." Has prevoiusly followed with nutrition. 2 meals per day.  - Exercise: none - Eye exam: 06/2018, Dr. Katy Fitch. - Foot exam: Due - Microalbumin: due - Statin: Yes - Pneumococcal vaccine: due - Denies symptoms of hypoglycemia, polyuria, polydipsia, numbness extremities, foot ulcers/trauma  Hypertension: - Medications: lasix 88m, lisinopril 487mdaily - Compliance: good - Checking BP at home: no - Denies any SOB, CP, vision changes, LE edema, medication SEs, or symptoms of hypotension - Diet: see above  - Exercise: see above  L ear pain - endorsing L ear pain since yesterday - noticing some slight discharge, not sure of the color. Noticed crusting on outside of ear. - used Qtip to clean it out. Got wax out.  - sore with laying down.   Review of Systems:  Per HPI.  Medications and smoking status reviewed.  Objective:   BP (!) 102/58   Pulse 98   Ht 5' 8"  (1.727 m)   Wt 197 lb 8 oz (89.6 kg)   LMP 02/21/2019   SpO2 99%   BMI 30.03 kg/m  Vitals and nursing note reviewed.  General: obese female, in no acute distress with non-toxic appearance HEENT: bilateral TMs clear without erythema, purulence, bulging. Canals free of cerumen. Slight excoriations noted to pinna of L ear.  CV: regular rate and rhythm  without murmurs, rubs, or gallops Lungs: clear to auscultation bilaterally with normal work of breathing Skin: warm, dry, no rashes or lesions Extremities: warm and well perfused, normal tone. Foot exam performed, wnl. MSK: ROM grossly intact, gait normal Neuro: Alert and oriented, speech normal  Assessment & Plan:   Type 2 diabetes mellitus without complication, without long-term current use of insulin (HCC) Worsened, A1c 11.8 today, not surprising given worsened CBGs a few weeks ago. CBGs this past week better controlled with increased insulin. Believe she would be a good candidate for GLP-1 agonist. Discussed with patient and elects to work harder on diet and exercise as this has not been a focus the past month and is hesitant to start additional medication. Will continue on adjusted insulin regimen and f/u in one month after initiating lifestyle modifications. Obtained updated lipid panel today. Declined pneumococcal vaccine today. Foot exam performed, wnl. Will attempt to obtain eye exam records.  Essential hypertension, benign Low normal today. Will decrease lisinopril dose, f/u in one week for BP check.  OBESITY, NOS Has gained weight since last appointment. Suspect due to worsened diet and lack of exercise. Discussed lifestyle modifications today.   Ear pain No evidence of infection on exam and is without symptoms of hearing loss. Low suspicion for infection, Meniere's disease, tumor. Slight excoriations to pinna, suspect irritation stemming from this. Recommended tylenol, ibuprofen as needed. F/u if  no better in a few days, not relieved with OTC pain control or if develops additional discharge, fevers.  Need for proof of immunization Needs proof of immunizations for school. Immunization record provided. Will obtain titers of varicella, Hep B, MMR as these were not on [facility]. Will call with results and Quant Gold.  Orders Placed This Encounter  Procedures  . Vitamin D 25-hydroxy  .  Lipid Panel  . QuantiFERON-TB Gold Plus  . Hepatitis B surface antibody  . Measles/Mumps/Rubella Immunity  . Varicella zoster antibody, IgG  . POCT UA - Microalbumin  . HgB A1c   Meds ordered this encounter  Medications  . lisinopril (ZESTRIL) 20 MG tablet    Sig: Take 1 tablet (20 mg total) by mouth daily.    Dispense:  90 tablet    Refill:  0  . Insulin Glargine (LANTUS SOLOSTAR) 100 UNIT/ML Solostar Pen    Sig: Inject 20 Units into the skin daily.    Dispense:  5 pen    Refill:  PRN    Rory Percy, DO PGY-3, [facility] 02/28/2019 4:53 PM

## 2019-02-28 NOTE — Assessment & Plan Note (Signed)
Has gained weight since last appointment. Suspect due to worsened diet and lack of exercise. Discussed lifestyle modifications today.

## 2019-02-28 NOTE — Assessment & Plan Note (Addendum)
Worsened, A1c 11.8 today, not surprising given worsened CBGs a few weeks ago. CBGs this past week better controlled with increased insulin. Believe she would be a good candidate for GLP-1 agonist. Discussed with patient and elects to work harder on diet and exercise as this has not been a focus the past month and is hesitant to start additional medication. Will continue on adjusted insulin regimen and f/u in one month after initiating lifestyle modifications. Obtained updated lipid panel today. Declined pneumococcal vaccine today. Foot exam performed, wnl. Will attempt to obtain eye exam records.

## 2019-02-28 NOTE — Telephone Encounter (Signed)
Patient is calling in regards to wanting to speak with Dr. Harrell Gave about diagnosis and letter for school.

## 2019-02-28 NOTE — Patient Instructions (Addendum)
It was great to see you!  Our plans for today:  - Call [facility] for a learning assessment at [phone]. They will be able to write a letter of recommendation if you need it. - We will call you with your A1c results and adjust your medication as needed, now continue to take your current regimen. - We are checking some labs today, we will call you or send you a letter if they are abnormal.  -We decreased your blood pressure medication dose today, you can take 1/2 tablet of your current prescription. Come back in 1 week for blood pressure check. - Your ear looks great.  You can take Tylenol or ibuprofen as needed for pain.  If it is still persistent after a week come back to see [place]. -Follow-up in 3 months.  Take care and seek immediate care sooner if you develop any concerns.   Dr. [facility]

## 2019-02-28 NOTE — Assessment & Plan Note (Signed)
Low normal today. Will decrease lisinopril dose, f/u in one week for BP check.

## 2019-02-28 NOTE — Telephone Encounter (Signed)
Pt states that she would like to speak with Dr. Harrell Gave directly. I asked pt if there was something that I could assist her with in order to expedite the process and she declined speaking with a nurse about her concerns. Pt states needs to speak only with Dr. Harrell Gave regarding her diagnosis. I advised pt that Dr. Harrell Gave may not be able to call her back right away. She verbalized understanding and still wishes to wait and discuss her concerns with the doctor directly.

## 2019-03-02 ENCOUNTER — Telehealth: Payer: Self-pay

## 2019-03-02 ENCOUNTER — Other Ambulatory Visit: Payer: Self-pay | Admitting: Family Medicine

## 2019-03-02 ENCOUNTER — Telehealth: Payer: Self-pay | Admitting: Cardiology

## 2019-03-02 DIAGNOSIS — E559 Vitamin D deficiency, unspecified: Secondary | ICD-10-CM

## 2019-03-02 LAB — VITAMIN D 25 HYDROXY (VIT D DEFICIENCY, FRACTURES): Vit D, 25-Hydroxy: 22.8 ng/mL — ABNORMAL LOW (ref 30.0–100.0)

## 2019-03-02 LAB — MEASLES/MUMPS/RUBELLA IMMUNITY
MUMPS ABS, IGG: 23.3 AU/mL (ref >10.9)
RUBEOLA AB, IGG: <13.5 AU/mL — ABNORMAL LOW (ref >16.4)
Rubella Antibodies, IGG: 0.93 index — ABNORMAL LOW (ref >0.99)

## 2019-03-02 LAB — LIPID PANEL
Chol/HDL Ratio: 2.8 ratio (ref 0.0–4.4)
Cholesterol, Total: 133 mg/dL (ref 100–199)
HDL: 48 mg/dL (ref >39)
LDL Chol Calc (NIH): 68 mg/dL (ref 0–99)
Triglycerides: 91 mg/dL (ref 0–149)
VLDL Cholesterol Cal: 17 mg/dL (ref 5–40)

## 2019-03-02 LAB — QUANTIFERON-TB GOLD PLUS
QuantiFERON Mitogen Value: >10 IU/mL
QuantiFERON Nil Value: 0.02 IU/mL
QuantiFERON TB1 Ag Value: 0.02 IU/mL
QuantiFERON TB2 Ag Value: 0.01 IU/mL
QuantiFERON-TB Gold Plus: NEGATIVE

## 2019-03-02 LAB — HEPATITIS B SURFACE ANTIBODY,QUALITATIVE: Hep B Surface Ab, Qual: NONREACTIVE

## 2019-03-02 LAB — VARICELLA ZOSTER ANTIBODY, IGG: Varicella zoster IgG: 1969 index (ref >165)

## 2019-03-02 MED ORDER — VITAMIN D3 1.25 MG (50000 UT) PO TABS: 1.0000 | ORAL_TABLET | ORAL | 0 refills | Status: DC

## 2019-03-02 MED ORDER — EXENATIDE ER 2 MG ~~LOC~~ PEN: 2.0000 mg | PEN_INJECTOR | SUBCUTANEOUS | 0 refills | Status: DC

## 2019-03-02 NOTE — Telephone Encounter (Signed)
See separate encounter for recommendations.

## 2019-03-02 NOTE — Telephone Encounter (Signed)
Returned call to patient. She is concerned as she had episodes with her diabetes, with sugars >500, that were difficult to control. Caused her to feel ill and miss classes. She is worried as she needs a note to keep going forward with her classes and asking if her heart is the reason she has memory issues.  I reviewed her most recent echo. EF and GLS, as well as diastolic function, borderline normal. No evidence that she has significant heart failure or low flow. Reviewed medications as well. She is on rosuvastatin for prevention given her diabetes, but her memory issue timeline does not correlate with this.  We discussed at length. Unfortunately I do not think I can be helpful at this time, as her memory issues and recent illness do not appear related to the heart.   I would recommend she discuss with her PCP--seems reasonable that if diabetes was related to her recent illness that this could be able to be provided in a letter, but as I do not manage her diabetes I will defer on this.  I also wonder if a formal memory evaluation is warranted--there were several points in our phone discussion today when she had difficulty finding words. I do not know any particular memory specialists, but I will tag her PCP in this note to see if they have suggestions.  We will plan to follow up as scheduled to monitor her cardiac issues.  Total phone call time 13 minutes.  Buford Dresser, MD, PhD [facility]  [address], [place], [place] [address] [phone]

## 2019-03-02 NOTE — Telephone Encounter (Signed)
Prescription sent to the pharmacy. Please have her make an appointment with our pharmacy team to discuss and receive instruction on how and when to inject. She should continue the current regimen she is on for now. After she meets with pharmacy and starts injections, she can decrease her insulin regimen back to novolog 5 mg twice daily and 14u of lantus once daily. She should follow back up with me in one month as we discussed.

## 2019-03-02 NOTE — Telephone Encounter (Signed)
Greatly appreciate Cardiology input and recommendations!  Teresa Chang and I did discuss this at our last appointment. She stated her memory issues had been ongoing for some time, at least >6 months. Given this, I do not feel her recent poor control of her diabetes is contributing. Cardiology also does not feel her heart failure is contributing which I would agree with. We did discuss a formal learning evaluation since her issues seem to stem mainly from her school work and comprehension per our conversation. I offered a formal Psychology evaluation and provided contact information of a local Psychologist who performs these. Patient stated she would think about it.

## 2019-03-02 NOTE — Telephone Encounter (Signed)
Patient calls nurse line stating PCP called her a few nights ago to discuss options for DM control. Patient stated at the time she wanted to wait. Patient stated after some thought she would like to try the new medication. Patient would like this sent to her pharmacy and a call from PCP to discuss use and if she needs to discontinue any current regime she's on. Please advise.

## 2019-03-05 ENCOUNTER — Encounter: Payer: Self-pay | Admitting: Family Medicine

## 2019-03-05 ENCOUNTER — Ambulatory Visit: Payer: Medicaid Other

## 2019-03-05 NOTE — Telephone Encounter (Signed)
Patient coming in for vaccines today and will update her then.  Jazmin Hartsell,CMA

## 2019-03-05 NOTE — Telephone Encounter (Signed)
Letter to patient sent via [place]. Explained that stress from chronic diseases is contributing to stress with learning and comprehension, that we are working to gain better control of her diabetes and that I have offered a psychology evaluation for learning and comprehension.

## 2019-03-05 NOTE — Telephone Encounter (Signed)
Spoke with patient and she would like to have a letter written for her school that due to her diabetes and [place] trying to get control of it patient has had to miss school.  She would like it to state that she had an episode due to her increased glucose and missed a week of school.  She would like for them to understand that when she has these problems, it can take her out for a few days.  She wants [place] to let them know that we are working with her to get her diabetes under control with a new medication regimen.  Will forward to MD to advise.  Patient does have a nurse visit today and would like to pick this up then if possible as to not have to come back into the office.  Patient informed of medication addition to the regimen and she is contemplating waiting until seeing Dr. Ky Barban in the next month before starting this.  Ruhani Umland,CMA

## 2019-03-06 ENCOUNTER — Other Ambulatory Visit: Payer: Self-pay

## 2019-03-06 ENCOUNTER — Ambulatory Visit (INDEPENDENT_AMBULATORY_CARE_PROVIDER_SITE_OTHER): Payer: Medicaid Other | Admitting: *Deleted

## 2019-03-06 DIAGNOSIS — Z289 Immunization not carried out for unspecified reason: Secondary | ICD-10-CM | POA: Diagnosis not present

## 2019-03-06 DIAGNOSIS — Z23 Encounter for immunization: Secondary | ICD-10-CM | POA: Diagnosis not present

## 2019-03-06 NOTE — Progress Notes (Signed)
Pt is here for several reason today.  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  1. MMR given in right arm.   Will check with MD to see if she needs a 2nd dose and will let pt know. (see recommendation from CDC, I think she only needs one dose)     2. PPD test - unable to place today as we are closed on Thursday and Friday.   Pt had a quantiferon gold drawn on 02/28/19.  I wonder if that is enough for her school.  LMOVM of Amy (program coordinator @ [employer]) for call back.  Appt made for next Tuesday to have PPD placed if needed.   3. Pt wonders if she has to have HEP B vaccines.  Upon review of chart, I dont think that she is immune to HEP B.  Verified with Dr. McDiarmid. It is unclear from school letter if she is required to have HEP B.  This is another questions I will ask Amy is she calls back.    I have also asked pt to call and followup on these questions.  She is agreeable.  Christen Bame, CMA

## 2019-03-07 NOTE — Progress Notes (Signed)
Per Dr. Ky Barban, only one dose of MMR needed.  Still waiting to hear back from Amy. Christen Bame, CMA

## 2019-03-12 ENCOUNTER — Telehealth: Payer: Self-pay | Admitting: Family Medicine

## 2019-03-12 NOTE — Telephone Encounter (Signed)
Spoke with patient and let her know that there aren't any notes letting me know that her program director has called [place] back yet.  Patient will reach out to her today, since it was likely that Amy may not have had a chance to call back.  Dependent on what she finds out she will keep or cancel tomorrow's appt for her tb test.  Johnney Ou

## 2019-03-12 NOTE — Telephone Encounter (Signed)
Patient has a question about what shot she is getting tomorrow, she wants to know if her school has called [place] yet.

## 2019-03-13 ENCOUNTER — Other Ambulatory Visit: Payer: Self-pay

## 2019-03-13 ENCOUNTER — Ambulatory Visit (INDEPENDENT_AMBULATORY_CARE_PROVIDER_SITE_OTHER): Payer: Medicaid Other | Admitting: *Deleted

## 2019-03-13 ENCOUNTER — Telehealth: Payer: Self-pay | Admitting: Cardiology

## 2019-03-13 DIAGNOSIS — Z289 Immunization not carried out for unspecified reason: Secondary | ICD-10-CM | POA: Diagnosis present

## 2019-03-13 DIAGNOSIS — Z23 Encounter for immunization: Secondary | ICD-10-CM | POA: Diagnosis not present

## 2019-03-13 NOTE — Telephone Encounter (Signed)
New Message     Pt c/o of Chest Pain: STAT if CP now or developed within 24 hours  1. Are you having CP right now? Chest Pain that she noticed 2 weeks ago, Yes pain now. She says it is sore there like she has pulled a muscle   2. Are you experiencing any other symptoms (ex. SOB, nausea, vomiting, sweating)? No   3. How long have you been experiencing CP? 2 weeks   4. Is your CP continuous or coming and going? Not as much when standing up up but she knows the pain is there. More frequent when moving a certain way   5. Have you taken Nitroglycerin? No

## 2019-03-13 NOTE — Telephone Encounter (Signed)
Spoke with pt who report for the past 2 weeks she has been experiencing off and on chest discomfort. She report symptoms are worse while lying or when she has to turn her body. She state symptoms feels similar to a pull muscle but hasn't did anything strenuous that would cause it.  Pt denies SOB or current pain but state she is concerned since pain hasn't completely resolved.  Will route to MD for recommendations.

## 2019-03-13 NOTE — Progress Notes (Signed)
Pt has not heard back from Amy @ her school.  She has decided to go ahead and get the Hep B series and well as a PPD today.   She has declined Flu "until she absolutely has to"  Patient is here for a PPD placement.  PPD placed in left forearm.  Patient will return 03/16/19 to have PPD read.   She tolerated the hep b well.   Christen Bame, CMA

## 2019-03-13 NOTE — Progress Notes (Signed)
On review, needs both MMR and Hep B vaccinations as she is non-immune. Spoke with Janett Billow.   Rory Percy, DO PGY-3, [facility] 03/13/2019 4:02 PM

## 2019-03-14 NOTE — Telephone Encounter (Signed)
Pt updated with MD's recommendation and voiced she would like to proceed with stress test. Message sent to scheduling.

## 2019-03-14 NOTE — Telephone Encounter (Signed)
We have follow up coming up soon. If the pain is tender to palpation or changes with movement, it is unlikely to be cardiac. However, we did discuss stress test previously, which is still ordered. If she has severe symptoms, she should present to ER, but otherwise we can discuss options at our visit. Thanks.

## 2019-03-16 ENCOUNTER — Telehealth: Payer: Self-pay | Admitting: Cardiology

## 2019-03-16 ENCOUNTER — Other Ambulatory Visit: Payer: Self-pay

## 2019-03-16 ENCOUNTER — Ambulatory Visit (INDEPENDENT_AMBULATORY_CARE_PROVIDER_SITE_OTHER): Payer: Medicaid Other | Admitting: *Deleted

## 2019-03-16 DIAGNOSIS — Z111 Encounter for screening for respiratory tuberculosis: Secondary | ICD-10-CM

## 2019-03-16 LAB — TB SKIN TEST
Induration: 0 mm
TB Skin Test: NEGATIVE

## 2019-03-16 NOTE — Telephone Encounter (Signed)
Per pt call she would like a call back  needs more information about her Myocard test coming up on 12/16 please.

## 2019-03-16 NOTE — Telephone Encounter (Signed)
Spoke to patient she stated she did not get any instructions for stress test scheduled 12/16.She understands to go for covid test 12/12 at 10:30 am.Instructions for stress myoview reviewed.Advised to hold Lasix and insulin morning of test.Take 1/2 insulin dose night before.No food 4 hours prior.No caffeine day before test and morning of.Advised ok to take Lisinopril with water morning of.Advised she can take all other medications after stress test.

## 2019-03-16 NOTE — Progress Notes (Signed)
.  Patient is here for a PPD read.  It was placed on 03/13/19 in the left forearm @ 4:01pm.    PPD RESULTS:  Result: negative Induration: 0 mm  Letter created and given to patient for documentation purposes. Christen Bame, CMA

## 2019-03-22 ENCOUNTER — Telehealth (HOSPITAL_COMMUNITY): Payer: Self-pay | Admitting: *Deleted

## 2019-03-22 NOTE — Telephone Encounter (Signed)
Patient given detailed instructions per Myocardial Perfusion Study Information Sheet for the test on 03/28/19 Patient notified to arrive 15 minutes early and that it is imperative to arrive on time for appointment to keep from having the test rescheduled.  If you need to cancel or reschedule your appointment, please call the office within 24 hours of your appointment. . Patient verbalized understanding. Teresa Chang    

## 2019-03-24 ENCOUNTER — Other Ambulatory Visit (HOSPITAL_COMMUNITY)
Admission: RE | Admit: 2019-03-24 | Discharge: 2019-03-24 | Disposition: A | Discharge status: Home / Self Care | Payer: Medicaid Other | Source: Ambulatory Visit | Attending: Cardiology | Admitting: Cardiology

## 2019-03-24 DIAGNOSIS — Z20828 Contact with and (suspected) exposure to other viral communicable diseases: Secondary | ICD-10-CM | POA: Insufficient documentation

## 2019-03-24 LAB — SARS CORONAVIRUS 2 (TAT 6-24 HRS): SARS Coronavirus 2: NEGATIVE

## 2019-03-26 ENCOUNTER — Ambulatory Visit: Payer: Medicaid Other | Admitting: Cardiology

## 2019-03-28 ENCOUNTER — Encounter (HOSPITAL_COMMUNITY): Payer: Medicaid Other

## 2019-03-30 ENCOUNTER — Telehealth (HOSPITAL_COMMUNITY): Payer: Self-pay

## 2019-03-30 NOTE — Telephone Encounter (Signed)
New message   Just an FYI. We have made several attempts to contact this patient including sending a letter to schedule or reschedule their [place]. We will be removing the patient from the WQ.   12.16.20 no show  8.10.20 @ 3:25pm both # are the same  lm on home vm Teresa Chang  8.4.20 @ 11:06am lm on home vm Teresa Chang 7.30.20 no show - Teresa Chang  7.13.20 @ 10:56am lm on home vm - Teresa Chang 7.9.20 no show Teresa Chang

## 2019-04-16 ENCOUNTER — Ambulatory Visit: Payer: Medicaid Other

## 2019-04-24 ENCOUNTER — Other Ambulatory Visit: Payer: Self-pay | Admitting: Family Medicine

## 2019-04-24 DIAGNOSIS — E559 Vitamin D deficiency, unspecified: Secondary | ICD-10-CM

## 2019-04-26 ENCOUNTER — Other Ambulatory Visit: Payer: Self-pay

## 2019-04-26 ENCOUNTER — Ambulatory Visit (INDEPENDENT_AMBULATORY_CARE_PROVIDER_SITE_OTHER): Payer: Medicaid Other

## 2019-04-26 DIAGNOSIS — Z23 Encounter for immunization: Secondary | ICD-10-CM | POA: Diagnosis not present

## 2019-04-26 DIAGNOSIS — Z289 Immunization not carried out for unspecified reason: Secondary | ICD-10-CM

## 2019-04-26 NOTE — Progress Notes (Signed)
Pt presents to clinic today for delayed immunizations. Patient received second hepatitis B vaccination in LD. Site unremarkable.   Pt informed she will need to schedule appointment March 23rd for final hepatitis vaccination.

## 2019-05-03 ENCOUNTER — Other Ambulatory Visit: Payer: Self-pay

## 2019-05-03 ENCOUNTER — Ambulatory Visit (INDEPENDENT_AMBULATORY_CARE_PROVIDER_SITE_OTHER): Payer: Medicaid Other

## 2019-05-03 DIAGNOSIS — Z23 Encounter for immunization: Secondary | ICD-10-CM | POA: Diagnosis not present

## 2019-05-04 ENCOUNTER — Other Ambulatory Visit: Payer: Self-pay

## 2019-05-04 DIAGNOSIS — E119 Type 2 diabetes mellitus without complications: Secondary | ICD-10-CM

## 2019-05-04 MED ORDER — METFORMIN HCL ER 500 MG PO TB24: 1000.0000 mg | ORAL_TABLET | Freq: Two times a day (BID) | ORAL | 5 refills | Status: DC

## 2019-06-14 ENCOUNTER — Other Ambulatory Visit: Payer: Self-pay

## 2019-06-14 DIAGNOSIS — E559 Vitamin D deficiency, unspecified: Secondary | ICD-10-CM

## 2019-06-14 MED ORDER — VITAMIN D3 1.25 MG (50000 UT) PO CAPS: ORAL_CAPSULE | ORAL | 0 refills | Status: DC

## 2019-06-15 ENCOUNTER — Telehealth: Payer: Self-pay | Admitting: Family Medicine

## 2019-06-15 DIAGNOSIS — E119 Type 2 diabetes mellitus without complications: Secondary | ICD-10-CM

## 2019-06-15 DIAGNOSIS — I5032 Chronic diastolic (congestive) heart failure: Secondary | ICD-10-CM

## 2019-06-18 MED ORDER — FUROSEMIDE 20 MG PO TABS: ORAL_TABLET | ORAL | 2 refills | Status: DC

## 2019-06-18 NOTE — Telephone Encounter (Signed)
LM for patient ok per DPR asking her to call back and schedule an appointment.  Shakeela Rabadan,CMA

## 2019-06-19 NOTE — Telephone Encounter (Signed)
Patient returning CMA's call. Scheduled patient for f/u for diabetes and HTN per [place]'s note. Appointment scheduled for April 23 @ 1:30

## 2019-06-25 ENCOUNTER — Other Ambulatory Visit: Payer: Self-pay

## 2019-06-25 MED ORDER — ACCU-CHEK AVIVA DEVI: 0 refills | Status: DC

## 2019-06-26 MED ORDER — ACCU-CHEK GUIDE W/DEVICE KIT: 1.0000 | PACK | Freq: Three times a day (TID) | 0 refills | Status: DC

## 2019-06-26 NOTE — Telephone Encounter (Signed)
Patient calls nurse line stating her insurance no covers the blood glucose meter sent in. With insurance change patient will also need the accessories to go along with it. Will send in the medicaid preferred meter.

## 2019-06-26 NOTE — Addendum Note (Signed)
Addended by: Steva Colder on: 06/26/2019 03:12 PM   Modules accepted: Orders

## 2019-06-27 ENCOUNTER — Telehealth: Payer: Self-pay

## 2019-06-27 MED ORDER — ACCU-CHEK SOFTCLIX LANCETS MISC: 12 refills | Status: DC

## 2019-06-27 MED ORDER — ACCU-CHEK SOFTCLIX LANCET DEV KIT: PACK | 0 refills | Status: DC

## 2019-06-27 MED ORDER — ACCU-CHEK GUIDE VI STRP: ORAL_STRIP | 12 refills | Status: DC

## 2019-06-27 NOTE — Telephone Encounter (Signed)
Received fax from pharmacy regarding glucometer not being covered by medicaid. After speaking with pharmacist, they discovered they had been running incorrectly. Pharmacist states that insurance will cover the accu-chek guide meter. Per pharmacist, patient requested the supplies to go with the meter.   Supplies sent into pharmacy  To PCP as FYI.

## 2019-07-30 ENCOUNTER — Telehealth: Payer: Medicaid Other | Admitting: Cardiology

## 2019-08-03 ENCOUNTER — Ambulatory Visit: Payer: Medicaid Other | Admitting: Family Medicine

## 2019-08-03 NOTE — Progress Notes (Deleted)
    SUBJECTIVE:   CHIEF COMPLAINT: diabetes and HTN f/u  HPI:   Diabetes, Type 2 - Last A1c 11.8 02/2019 - Medications: Exenatide 2 mg once weekly, Lantus 20 units daily, NovoLog 5 units twice daily, Jardiance 25 mg daily, Metformin 1000 mg twice daily - Compliance: *** - Checking BG at home: *** - Diet: *** - Exercise: *** - Eye exam: *** - Foot exam: *** - Microalbumin: UTD - Statin: Yes - Denies symptoms of hypoglycemia, polyuria, polydipsia, numbness extremities, foot ulcers/trauma  Hypertension: - Medications: Lisinopril 20 mg, Lasix 20 mg every other day - Compliance: *** - Checking BP at home: *** - Denies any SOB, CP, vision changes, LE edema, medication SEs, or symptoms of hypotension - Diet: *** - Exercise: ***  Health maintenance -Due for colonoscopy, Pap smear, mammogram  ***make appt for pap smear   PERTINENT  PMH / PSH: HTN, HFpEF, T2DM, OA, obesity, anxiety, tobacco use, depression, bipolar disorder, HLD  OBJECTIVE:   There were no vitals taken for this visit.  ***  ASSESSMENT/PLAN:   No problem-specific Assessment & Plan notes found for this encounter.     Ellwood Dense, DO [facility]

## 2019-08-06 ENCOUNTER — Other Ambulatory Visit: Payer: Self-pay | Admitting: Family Medicine

## 2019-08-06 DIAGNOSIS — E119 Type 2 diabetes mellitus without complications: Secondary | ICD-10-CM

## 2019-08-06 DIAGNOSIS — E559 Vitamin D deficiency, unspecified: Secondary | ICD-10-CM

## 2019-08-06 DIAGNOSIS — I10 Essential (primary) hypertension: Secondary | ICD-10-CM

## 2019-08-07 MED ORDER — ROSUVASTATIN CALCIUM 20 MG PO TABS: 20.0000 mg | ORAL_TABLET | Freq: Every day | ORAL | 0 refills | Status: DC

## 2019-08-07 MED ORDER — INSULIN ASPART 100 UNIT/ML FLEXPEN: 5.0000 [IU] | PEN_INJECTOR | Freq: Two times a day (BID) | SUBCUTANEOUS | 0 refills | Status: DC

## 2019-08-07 NOTE — Telephone Encounter (Signed)
Spoke with PCP who gave verbal to fill meds for one month.  Appt is Tuesday.  Pt reports:  1. She only takes "novolog occasionally when her sugar is high".  Advised to discuss with PCP at appt as this is not the directions and I would send in one months worth as we have in her chart.  She will bring meter with ehr to the appt.  2. State that she has been taking 40 mg of lisinopril, but last note I see says to decrease lisinopril 20mg .  Pt still has 20 mgs left.  Will take till appt on Tuesday. Saturday, CMA

## 2019-08-08 MED ORDER — INSULIN ASPART 100 UNIT/ML FLEXPEN: 5.0000 [IU] | PEN_INJECTOR | Freq: Two times a day (BID) | SUBCUTANEOUS | 0 refills | Status: DC

## 2019-08-08 NOTE — Telephone Encounter (Signed)
Accidentally set Novolog to no print.  Resent this am. Jone Baseman, CMA

## 2019-08-08 NOTE — Addendum Note (Signed)
Addended by: Jone Baseman D on: 08/08/2019 09:31 AM   Modules accepted: Orders

## 2019-08-10 ENCOUNTER — Ambulatory Visit: Payer: Medicaid Other | Admitting: Cardiology

## 2019-08-14 ENCOUNTER — Encounter: Payer: Self-pay | Admitting: Pharmacist

## 2019-08-14 ENCOUNTER — Other Ambulatory Visit: Payer: Self-pay

## 2019-08-14 ENCOUNTER — Ambulatory Visit (INDEPENDENT_AMBULATORY_CARE_PROVIDER_SITE_OTHER): Payer: Medicaid Other | Admitting: Pharmacist

## 2019-08-14 ENCOUNTER — Ambulatory Visit (INDEPENDENT_AMBULATORY_CARE_PROVIDER_SITE_OTHER): Payer: Medicaid Other | Admitting: Family Medicine

## 2019-08-14 ENCOUNTER — Encounter: Payer: Self-pay | Admitting: Family Medicine

## 2019-08-14 VITALS — BP 116/72 | HR 71 | Ht 68.0 in | Wt 204.0 lb

## 2019-08-14 DIAGNOSIS — E119 Type 2 diabetes mellitus without complications: Secondary | ICD-10-CM | POA: Diagnosis not present

## 2019-08-14 DIAGNOSIS — I10 Essential (primary) hypertension: Secondary | ICD-10-CM

## 2019-08-14 DIAGNOSIS — E559 Vitamin D deficiency, unspecified: Secondary | ICD-10-CM | POA: Diagnosis not present

## 2019-08-14 DIAGNOSIS — Z1211 Encounter for screening for malignant neoplasm of colon: Secondary | ICD-10-CM | POA: Diagnosis not present

## 2019-08-14 DIAGNOSIS — Z1231 Encounter for screening mammogram for malignant neoplasm of breast: Secondary | ICD-10-CM

## 2019-08-14 DIAGNOSIS — I5032 Chronic diastolic (congestive) heart failure: Secondary | ICD-10-CM

## 2019-08-14 DIAGNOSIS — R202 Paresthesia of skin: Secondary | ICD-10-CM | POA: Diagnosis not present

## 2019-08-14 LAB — POCT GLYCOSYLATED HEMOGLOBIN (HGB A1C): HbA1c, POC (controlled diabetic range): 7.3 % — AB (ref 0.0–7.0)

## 2019-08-14 MED ORDER — METFORMIN HCL ER 500 MG PO TB24: 1000.0000 mg | ORAL_TABLET | Freq: Every day | ORAL | Status: DC

## 2019-08-14 MED ORDER — FUROSEMIDE 20 MG PO TABS: ORAL_TABLET | ORAL | 2 refills | Status: DC

## 2019-08-14 NOTE — Assessment & Plan Note (Signed)
At goal without hypotensive symptoms, continue current regimen although only take lasix prn for leg swelling. BMP obtained today.

## 2019-08-14 NOTE — Assessment & Plan Note (Signed)
Much improved despite self-discontinuing insulin and decreasing metformin dose. A1c 7.3 today, improved from 11.8. Will discontinue insulin, ok to continue metformin at lower dose. Continue jardiance. Work on diet and exercise changes. BMP obtained today. F/u in 3 months for next A1c.

## 2019-08-14 NOTE — Progress Notes (Signed)
Reviewed: I agree with Dr. Koval's documentation and management. 

## 2019-08-14 NOTE — Assessment & Plan Note (Addendum)
Unclear etiology. May be diabetic neuropathy given glove distribution with proximal progression however unilateral nature and lack of numbness/pain are uncommon. Findings not consistent with carpal or ulnar tunnel syndrome. No history or findings to suggest trauma. Will obtain B12 levels as well as electrolytes. Can consider EMG if continues to be asymmetric and persistent, especially if diabetes continues to remain controlled.

## 2019-08-14 NOTE — Progress Notes (Signed)
S:     Chief Complaint  Patient presents with  . Medication Management    Diabetes    Patient arrives in good spirits, ambulating without assistance, in no apparent distress.  Presents for diabetes evaluation, education, and management Patient was referred and last seen by Primary Care Provider, Dr. Ky Barban on 03/02/2019 and previously seen in Rx Clinic 05/2018.    Insurance coverage/medication affordability: Medicaid  Medication adherence reported as excellent with medications she is currently taking. She has stopped several medications she was previously prescribed including lantus and novolog insulin and Exenatide.    Current diabetes medications include: Jardiance (empagliflozin) 25mg  dailiy and Metformin 1000mg  once daily with 2 x 500mg  XR.   Current hypertension medications include: lisinopril 20mg  Current hyperlipidemia medications include: rosuvastatin 20mg  daily  Patient denies hypoglycemic events.  Patient-reported exercise habits: limited and planning on increasing    Patient reports neuropathy (nerve pain) new in her left arm that is described as tingling.      O:  Physical Exam Vitals reviewed.  Constitutional:      Appearance: Normal appearance.  Pulmonary:     Effort: Pulmonary effort is normal.  Musculoskeletal:     Right lower leg: No edema.     Left lower leg: No edema.  Neurological:     Mental Status: She is alert.  Psychiatric:        Behavior: Behavior normal.        Thought Content: Thought content normal.        Judgment: Judgment normal.    Review of Systems  Cardiovascular: Negative for leg swelling.  Neurological: Positive for tingling.       Left arm  All other systems reviewed and are negative.    Lab Results  Component Value Date   HGBA1C 7.3 (A) 08/14/2019   Vitals:   08/14/19 1004  BP: 116/72  Pulse: 71  SpO2: 98%    Lipid Panel     Component Value Date/Time   CHOL 133 02/28/2019 1342   TRIG 91 02/28/2019 1342     HDL 48 02/28/2019 1342   CHOLHDL 2.8 02/28/2019 1342   CHOLHDL 4.3 05/25/2016 1620   VLDL 39 (H) 05/25/2016 1620   LDLCALC 68 02/28/2019 1342    Home fasting blood sugars:  7 day average = 145 with Blood Glucose today = 179     Clinical Atherosclerotic Cardiovascular Disease (ASCVD): No  The 10-year ASCVD risk score Mikey Bussing DC Jr., et al., 2013) is: 4.5%   Values used to calculate the score:     Age: 49 years     Sex: Female     Is Non-Hispanic African American: Yes     Diabetic: Yes     Tobacco smoker: No     Systolic Blood Pressure: 841 mmHg     Is BP treated: Yes     HDL Cholesterol: 48 mg/dL     Total Cholesterol: 133 mg/dL    A/P: Diabetes currently much improved with A1c at goal of 7.3. Medication adherence appears excellent. Control is suboptimal due to low activity level - plans to increase in the near future. -Continued Metformin at 1000mg  (2x 500mg  XR) -Continued SGLT2-I Jardiance (generic name empagliflozin) at 25mg  daily. -Extensively discussed pathophysiology of diabetes, recommended lifestyle interventions, dietary effects on blood sugar control -Counseled on s/sx of and management of hypoglycemia -Next A1C anticipated 3-6 months.  ASCVD risk - primary prevention in patient with diabetes. Last LDL is controlled. ASCVD risk score is  not >20%   -Continued rosuvastatin 20 mg.   Hypertension longstanding and currently controlled.  Blood pressure goal = <130/80 mmHg. Medication adherence excellent.    Written patient instructions provided.  Total time in face to face counseling 30 minutes.   Follow up Pharmacist PRN.  Patient seen with Rubye Oaks, PharmD Candidate and Gerrit Halls, PharmD PGY-1 Resident.

## 2019-08-14 NOTE — Progress Notes (Signed)
    SUBJECTIVE:   CHIEF COMPLAINT: Diabetes follow-up  HPI:   Diabetes, Type 2 - Last A1c 11.8 02/2019 - Medications: Lantus 20 units daily, NovoLog 5 units 3 times daily, Jardiance 25 mg, Metformin 1000 mg twice daily, exenatide 2 mg weekly - Compliance: not taking lantus. Only taking novolog once in a while if sugars are high. Taking metformin 2 tabs once daily. Not taking exenatide not taking  - Checking BG at home: yes, but not regularly. 110-200s. Usually 120-130s. Higher with higher glycemic foods.  - Diet: 2 meals per day. Vegetables and fruits. Occasionally will have pie or cake. - Exercise: hasn't since the pandemic.  - Eye exam: ordered - Foot exam: UTD - Microalbumin: n/a - Statin: Yes - Denies symptoms of hypoglycemia, polyuria, polydipsia, numbness extremities, foot ulcers/trauma  Tingling in arm - endorsing 1 month h/o tingling in L arm. Starts in hand and progresses up arm. Comes and goes. No numbness, weakness, or pain. Occurs over entirety of hand.  Hypertension: - Medications: Lisinopril 20 mg daily, Lasix 20 mg every other day - Compliance: good - Checking BP at home: no - Denies any SOB, CP, vision changes, LE edema, medication SEs, or symptoms of hypotension - Diet: see above - Exercise: see above  Health Maintenance - due for mammogram, colonoscopy, pap smear. No family history of breast or colon cancer  PERTINENT  PMH / PSH: HTN, diastolic heart failure, T2DM, obesity, anxiety, tobacco use, depression, bipolar disorder, HLD, OA  OBJECTIVE:   BP 116/72   Pulse 71   Ht 5\' 8"  (1.727 m)   Wt 204 lb (92.5 kg)   BMI 31.02 kg/m   Gen: well appearing, overweight, in NAD Cardiac: regular rate, no LE edema Resp: normal WOB on RA MSK; intact bilateral grip strength, preserved thenar and hypothenar eminences. Intact and symmetric sensation to light touch bilaterally. 5/5 UE strength bilaterally. No overlying skin changes.   ASSESSMENT/PLAN:   Type 2  diabetes mellitus without complication, without long-term current use of insulin (HCC) Much improved despite self-discontinuing insulin and decreasing metformin dose. A1c 7.3 today, improved from 11.8. Will discontinue insulin, ok to continue metformin at lower dose. Continue jardiance. Work on diet and exercise changes. BMP obtained today. F/u in 3 months for next A1c.  Essential hypertension, benign At goal without hypotensive symptoms, continue current regimen although only take lasix prn for leg swelling. BMP obtained today.  Tingling Unclear etiology. May be diabetic neuropathy given glove distribution with proximal progression however unilateral nature and lack of numbness/pain are uncommon. Findings not consistent with carpal or ulnar tunnel syndrome. No history or findings to suggest trauma. Will obtain B12 levels as well as electrolytes. Can consider EMG if continues to be asymmetric and persistent, especially if diabetes continues to remain controlled.    Health Maintenance Orders placed for mammogram and colonoscopy today. Advised pt to make appt for pap smear soon.   , DO [facility]

## 2019-08-14 NOTE — Patient Instructions (Addendum)
It was great to see you!  Our plans for today:  - No changes to your medications. We updated your med list today. - We are checking some labs today, we will call you or send you a letter if they are abnormal.  - Come back in 3 months for your next A1c.  Take care and seek immediate care sooner if you develop any concerns.   Dr. [facility]   COVID-19 Vaccine Information can be found at: [email or web address] For questions related to vaccine distribution or appointments, please email [email or web address] or call [phone].

## 2019-08-14 NOTE — Assessment & Plan Note (Signed)
Diabetes currently much improved with A1c at goal of 7.3. Medication adherence appears excellent. Control is suboptimal due to low activity level - plans to increase in the near future. -Continued Metformin at 1000mg  (2x 500mg  XR) -Continued SGLT2-I Jardiance (generic name empagliflozin) at 25mg  daily. -Extensively discussed pathophysiology of diabetes, recommended lifestyle interventions, dietary effects on blood sugar control -Counseled on s/sx of and management of hypoglycemia -Next A1C anticipated 3-6 months.

## 2019-08-14 NOTE — Patient Instructions (Signed)
Great to see you today.   A1C is great with today's value of 7.3  No change in medications today.   Follow-up per Dr. Linwood Dibbles.

## 2019-08-15 ENCOUNTER — Telehealth: Payer: Self-pay

## 2019-08-15 ENCOUNTER — Encounter: Payer: Self-pay | Admitting: Gastroenterology

## 2019-08-15 LAB — BASIC METABOLIC PANEL
BUN/Creatinine Ratio: 8 — ABNORMAL LOW (ref 9–23)
BUN: 6 mg/dL (ref 6–24)
CO2: 17 mmol/L — ABNORMAL LOW (ref 20–29)
Calcium: 9.2 mg/dL (ref 8.7–10.2)
Chloride: 110 mmol/L — ABNORMAL HIGH (ref 96–106)
Creatinine, Ser: 0.8 mg/dL (ref 0.57–1.00)
GFR calc Af Amer: 99 mL/min/{1.73_m2} (ref >59)
GFR calc non Af Amer: 86 mL/min/{1.73_m2} (ref >59)
Glucose: 175 mg/dL — ABNORMAL HIGH (ref 65–99)
Potassium: 4.1 mmol/L (ref 3.5–5.2)
Sodium: 145 mmol/L — ABNORMAL HIGH (ref 134–144)

## 2019-08-15 LAB — VITAMIN D 25 HYDROXY (VIT D DEFICIENCY, FRACTURES): Vit D, 25-Hydroxy: 57.2 ng/mL (ref 30.0–100.0)

## 2019-08-15 LAB — VITAMIN B12: Vitamin B-12: 267 pg/mL (ref 232–1245)

## 2019-08-15 NOTE — Telephone Encounter (Signed)
Patient called nurse line regarding results from office visit yesterday. Patient inquiring about if she should be taking vitamin B supplement. Patient also concerned about hair loss related to lisinopril.   To PCP  Please advise  Veronda Prude, RN

## 2019-08-16 ENCOUNTER — Ambulatory Visit: Payer: Medicaid Other | Attending: Internal Medicine

## 2019-08-16 DIAGNOSIS — Z23 Encounter for immunization: Secondary | ICD-10-CM

## 2019-08-16 NOTE — Progress Notes (Signed)
   Covid-19 Vaccination Clinic  Name:  Teresa Chang    MRN: [id number] DOB: [birth year]  08/16/2019  [name] was observed post Covid-19 immunization for 15 minutes without incident. She was provided with Vaccine Information Sheet and instruction to access the V-Safe system.   [name] was instructed to call 911 with any severe reactions post vaccine: Marland Kitchen Difficulty breathing  . Swelling of face and throat  . A fast heartbeat  . A bad rash all over body  . Dizziness and weakness   Immunizations Administered    Name Date Dose VIS Date Route   Pfizer COVID-19 Vaccine 08/16/2019  4:03 PM 0.3 mL 06/06/2018 Intramuscular   Manufacturer: [employer], [employer]   Lot: [id number]   NDC: [address]-8

## 2019-08-17 NOTE — Addendum Note (Signed)
Addended by: Caro Laroche on: 08/17/2019 05:23 PM   Modules accepted: Orders

## 2019-08-17 NOTE — Telephone Encounter (Signed)
Called patient and discussed results

## 2019-08-25 ENCOUNTER — Other Ambulatory Visit: Payer: Self-pay | Admitting: Family Medicine

## 2019-08-27 NOTE — Telephone Encounter (Signed)
Pt is calling to have the doctor call in test strips for her Accu-Check older model. She still using the model that Dr. Sydnee Cabal gave her. She also needs a refill on her vitamin D. jw

## 2019-08-28 NOTE — Telephone Encounter (Signed)
Spoke with patient and she states that the time she stopped the vitamin D after her labs were normal, she felt awful.  She said that she was unable to get out of bed and then had to restart the medication back quickly.  Will to MD as patient is wanting to stay on this medication.  Everlie Eble,CMA

## 2019-08-29 NOTE — Telephone Encounter (Signed)
She can get vitamin D over the counter and take 5000 IU daily. Taking 50,0000 units of vitamin D is not meant to be taken every week for extended periods of time (only for about 8 weeks at a time). We can recheck her levels in about 3-4 months or so. If she starts to feel bad, she should come in for an evaluation to ensure nothing else is contributing.

## 2019-08-29 NOTE — Telephone Encounter (Signed)
Spoke with pt. Informed her of note. Pt then goes into needing a physical form filled out for her daughter. I told her that the standard policy is 7 days for form completion. Then she stated that she has things on her chart that needs to be removed. Like it saying that she smokes, she states that she has not smoked in 5 plus years. And that the only thing that needs to me in her record is that she has diabetes. I told her that she would need to discuss that with her PCP. She said she don't have time for that. Aquilla Solian, CMA

## 2019-09-11 ENCOUNTER — Ambulatory Visit: Payer: Medicaid Other | Attending: Internal Medicine

## 2019-09-11 ENCOUNTER — Ambulatory Visit: Payer: Medicaid Other

## 2019-09-11 DIAGNOSIS — Z23 Encounter for immunization: Secondary | ICD-10-CM

## 2019-09-11 NOTE — Progress Notes (Signed)
   Covid-19 Vaccination Clinic  Name:  Teresa Chang    MRN: [id number] DOB: 06-14-1970  09/11/2019  Teresa Chang was observed post Covid-19 immunization for 15 minutes without incident. She was provided with Vaccine Information Sheet and instruction to access the V-Safe system.   Teresa Chang was instructed to call 911 with any severe reactions post vaccine: Marland Kitchen Difficulty breathing  . Swelling of face and throat  . A fast heartbeat  . A bad rash all over body  . Dizziness and weakness   Immunizations Administered    Name Date Dose VIS Date Route   Pfizer COVID-19 Vaccine 09/11/2019  4:45 PM 0.3 mL 06/06/2018 Intramuscular   Manufacturer: [employer], [employer]   Lot: [id number]   NDC: [address]-8

## 2019-11-02 ENCOUNTER — Other Ambulatory Visit: Payer: Self-pay

## 2019-11-02 DIAGNOSIS — E559 Vitamin D deficiency, unspecified: Secondary | ICD-10-CM

## 2019-11-02 NOTE — Telephone Encounter (Signed)
Patient calls nurse line requesting a refill on Vitamin D. Patient reports her old PCP, Diallo, had her on Vitamin D for "severe" pain in her "bones." Patient reports she saw Rumball back in May who advised her to stop and "took away" her Vitamin D. Patient scheduled with new PCP for 8/5. Patient requesting medication to be sent to her pharmacy in the meantime. Please advise.

## 2019-11-04 NOTE — Telephone Encounter (Signed)
Last Vit D check 22 8 months ago. Recommend checking another Vit D level as it is summer and unlikely the cause of "bone pain." Recommend patient get vitamin D checked again and come for a visit, currently scheduled 8/5. Will place future order for Vit D lab and patient can do at her convenience prior to appointment if she wishes.  Shirlean Mylar, MD [facility] Family Medicine Residency, PGY-2

## 2019-11-06 ENCOUNTER — Other Ambulatory Visit: Payer: Self-pay

## 2019-11-06 ENCOUNTER — Other Ambulatory Visit: Payer: Medicaid Other

## 2019-11-06 DIAGNOSIS — E559 Vitamin D deficiency, unspecified: Secondary | ICD-10-CM | POA: Diagnosis not present

## 2019-11-07 LAB — VITAMIN D 25 HYDROXY (VIT D DEFICIENCY, FRACTURES): Vit D, 25-Hydroxy: 34.8 ng/mL (ref 30.0–100.0)

## 2019-11-09 ENCOUNTER — Encounter: Payer: Medicaid Other | Admitting: Gastroenterology

## 2019-11-15 ENCOUNTER — Ambulatory Visit: Payer: Medicaid Other | Admitting: Family Medicine

## 2019-12-07 ENCOUNTER — Encounter: Payer: Self-pay | Admitting: Family Medicine

## 2019-12-07 ENCOUNTER — Other Ambulatory Visit: Payer: Self-pay

## 2019-12-07 DIAGNOSIS — I10 Essential (primary) hypertension: Secondary | ICD-10-CM

## 2019-12-07 MED ORDER — LISINOPRIL 20 MG PO TABS: 20.0000 mg | ORAL_TABLET | Freq: Every day | ORAL | 0 refills | Status: DC

## 2019-12-14 ENCOUNTER — Encounter (HOSPITAL_COMMUNITY): Payer: Self-pay | Admitting: Emergency Medicine

## 2019-12-14 ENCOUNTER — Other Ambulatory Visit: Payer: Self-pay

## 2019-12-14 ENCOUNTER — Ambulatory Visit (HOSPITAL_COMMUNITY)
Admission: EM | Admit: 2019-12-14 | Discharge: 2019-12-14 | Disposition: A | Discharge status: Home / Self Care | Payer: Medicaid Other | Attending: Internal Medicine | Admitting: Internal Medicine

## 2019-12-14 DIAGNOSIS — J309 Allergic rhinitis, unspecified: Secondary | ICD-10-CM | POA: Diagnosis not present

## 2019-12-14 DIAGNOSIS — Z20822 Contact with and (suspected) exposure to covid-19: Secondary | ICD-10-CM | POA: Diagnosis not present

## 2019-12-14 LAB — SARS CORONAVIRUS 2 (TAT 6-24 HRS): SARS Coronavirus 2: NEGATIVE

## 2019-12-14 MED ORDER — FLUTICASONE PROPIONATE 50 MCG/ACT NA SUSP
1.0000 | Freq: Every day | NASAL | 0 refills | Status: DC
Start: 2019-12-14 — End: 2021-03-09

## 2019-12-14 MED ORDER — LORATADINE 10 MG PO TABS
10.0000 mg | ORAL_TABLET | Freq: Every day | ORAL | 2 refills | Status: DC
Start: 2019-12-14 — End: 2021-10-08

## 2019-12-14 NOTE — ED Provider Notes (Signed)
[facility]    CSN: [id number] Arrival date & time: 12/14/19  2376      History   Chief Complaint Chief Complaint  Patient presents with  . URI    HPI Teresa Chang is a 49 y.o. female comes to urgent care with complaints of nasal congestion, clear rhinorrhea and a mild sore throat of 2 days duration.  Patient has seasonal allergies which is typically in the fall.  She denies any fever or chills.  No nausea or vomiting.  No loss of taste or smell.  No sick contacts.  Patient is fully vaccinated against COVID-19 virus.Marland Kitchen   HPI  Past Medical History:  Diagnosis Date  . Anxiety   . Bipolar disorder (HCC)   . Depression   . Hypertension   . Type 2 diabetes mellitus [facility])     Patient Active Problem List   Diagnosis Date Noted  . Tingling 08/14/2019  . Ear pain 02/28/2019  . Chronic diastolic heart failure (HCC) 06/20/2018  . Mixed hyperlipidemia 06/02/2018  . Type 2 diabetes mellitus without complication, without long-term current use of insulin (HCC) 04/06/2018  . Swelling of lower extremity 10/12/2017  . Health care maintenance 06/18/2013  . Bipolar disorder current episode depressed (HCC) 02/03/2012  . Major depressive disorder, recurrent episode (HCC) 12/20/2011  . Paraspinal muscle spasm 09/10/2011  . Shortness of breath 02/19/2011  . Fatigue 02/19/2011  . ANXIETY DISORDER 07/03/2009  . Essential hypertension, benign 04/04/2007  . VAGINAL DISCHARGE 04/04/2007  . OBESITY, NOS 06/09/2006  . TOBACCO DEPENDENCE 06/09/2006  . OSTEOARTHRITIS, MULTI SITES 06/09/2006    History reviewed. No pertinent surgical history.  OB History   No obstetric history on file.      Home Medications    Prior to Admission medications   Medication Sig Start Date End Date Taking? Authorizing Provider  ACCU-CHEK AVIVA PLUS test strip TEST BLOOD SUGAR LEVELS AS DIRECTED 08/27/19   Ellwood Dense, MD  Accu-Chek Softclix Lancets lancets Use daily. E11.9 06/27/19   Ellwood Dense, MD  Cholecalciferol (VITAMIN D3) 1.25 MG (50000 UT) CAPS TAKE 1 TABLET BY MOUTH 1 TIME A WEEK FOR 8 WEEKS 08/07/19   Ellwood Dense, MD  fluticasone [facility]) 50 MCG/ACT nasal spray Place 1 spray into both nostrils daily. 12/14/19   Haadi Santellan, Britta Mccreedy, MD  furosemide (LASIX) 20 MG tablet Take one tablet every other day as needed for swelling. 08/14/19   Ellwood Dense, MD  JARDIANCE 25 MG TABS tablet TAKE 1 TABLET BY MOUTH DAILY 06/18/19   Ellwood Dense, MD  lisinopril (ZESTRIL) 20 MG tablet Take 1 tablet (20 mg total) by mouth daily. 12/07/19   Shirlean Mylar, MD  loratadine (CLARITIN) 10 MG tablet Take 1 tablet (10 mg total) by mouth daily. 12/14/19   Merrilee Jansky, MD  metFORMIN (GLUCOPHAGE-XR) 500 MG 24 hr tablet Take 2 tablets (1,000 mg total) by mouth daily with breakfast. 08/14/19   Kathrin Ruddy, RPH-CPP  rosuvastatin (CRESTOR) 20 MG tablet Take 1 tablet (20 mg total) by mouth daily. 08/07/19   Ellwood Dense, MD    Family History Family History  Problem Relation Age of Onset  . Stroke Maternal Grandmother     Social History Social History   Tobacco Use  . Smoking status: Former Smoker    Packs/day: 0.30  . Smokeless tobacco: Never Used  Substance Use Topics  . Alcohol use: No  . Drug use: No     Allergies   Patient has no known  allergies.   Review of Systems Review of Systems  Constitutional: Negative.   HENT: Positive for congestion and sore throat. Negative for ear discharge, ear pain, hearing loss, sinus pressure and sinus pain.   Respiratory: Negative for cough, choking and shortness of breath.   Cardiovascular: Negative for chest pain.  Genitourinary: Negative.      Physical Exam Triage Vital Signs ED Triage Vitals  Enc Vitals Group     BP 12/14/19 0954 135/89     Pulse Rate 12/14/19 0954 67     Resp 12/14/19 0954 18     Temp 12/14/19 0954 98.8 F (37.1 C)     Temp Source 12/14/19 0954 Oral     SpO2 12/14/19 0954 98 %     Weight --       Height --      Head Circumference --      Peak Flow --      Pain Score 12/14/19 0955 9     Pain Loc --      Pain Edu? --      Excl. in GC? --    No data found.  Updated Vital Signs BP 135/89   Pulse 67   Temp 98.8 F (37.1 C) (Oral)   Resp 18   LMP 12/06/2019   SpO2 98%   Visual Acuity Right Eye Distance:   Left Eye Distance:   Bilateral Distance:    Right Eye Near:   Left Eye Near:    Bilateral Near:     Physical Exam Vitals and nursing note reviewed.  Constitutional:      Appearance: Normal appearance.  HENT:     Right Ear: Tympanic membrane normal.     Left Ear: Tympanic membrane normal.     Mouth/Throat:     Pharynx: No oropharyngeal exudate or posterior oropharyngeal erythema.  Eyes:     Extraocular Movements: Extraocular movements intact.     Conjunctiva/sclera: Conjunctivae normal.  Abdominal:     General: Bowel sounds are normal.     Palpations: Abdomen is soft.  Neurological:     Mental Status: She is alert.      UC Treatments / Results  Labs (all labs ordered are listed, but only abnormal results are displayed) Labs Reviewed  SARS CORONAVIRUS 2 (TAT 6-24 HRS)    EKG   Radiology No results found.  Procedures Procedures (including critical care time)  Medications Ordered in UC Medications - No data to display  Initial Impression / Assessment and Plan / UC Course  I have reviewed the triage vital signs and the nursing notes.  Pertinent labs & imaging results that were available during my care of the patient were reviewed by me and considered in my medical decision making (see chart for details).     1.  Acute allergic rhinosinusitis: Flonase daily Claritin COVID-19 test sent Please quarantine until COVID-19 test results are available Return to urgent care if you have any further questions or concerns. Final Clinical Impressions(s) / UC Diagnoses   Final diagnoses:  Allergic sinusitis     Discharge Instructions     Please  quarantine until COVID-19 test results are available Increase oral fluids If your symptoms worsen please return to the urgent care to be reevaluated.   ED Prescriptions    Medication Sig Dispense Auth. Provider   fluticasone (FLONASE) 50 MCG/ACT nasal spray Place 1 spray into both nostrils daily. 16 g Merrilee Jansky, MD   loratadine (CLARITIN) 10 MG tablet Take 1  tablet (10 mg total) by mouth daily. 30 tablet Tsugio Elison, Britta Mccreedy, MD     PDMP not reviewed this encounter.   Merrilee Jansky, MD 12/14/19 1113

## 2019-12-14 NOTE — ED Triage Notes (Signed)
Pt presents to [facility] for assessment of nasal congestion starting 2 days ago.  States the head congestion and mild sore throat have gotten worse over the past 2 days.  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

## 2019-12-14 NOTE — Discharge Instructions (Signed)
Please quarantine until COVID-19 test results are available Increase oral fluids If your symptoms worsen please return to the urgent care to be reevaluated.

## 2020-01-19 ENCOUNTER — Other Ambulatory Visit: Payer: Self-pay | Admitting: Family Medicine

## 2020-01-19 DIAGNOSIS — E119 Type 2 diabetes mellitus without complications: Secondary | ICD-10-CM

## 2020-01-19 NOTE — Telephone Encounter (Signed)
Re routing to Dr Leary Roca.

## 2020-01-26 ENCOUNTER — Other Ambulatory Visit: Payer: Self-pay | Admitting: Family Medicine

## 2020-01-26 DIAGNOSIS — E119 Type 2 diabetes mellitus without complications: Secondary | ICD-10-CM

## 2020-01-26 DIAGNOSIS — I10 Essential (primary) hypertension: Secondary | ICD-10-CM

## 2020-01-26 NOTE — Telephone Encounter (Signed)
Rerouting to [facility]

## 2020-04-29 ENCOUNTER — Encounter (HOSPITAL_COMMUNITY): Payer: Self-pay

## 2020-04-29 ENCOUNTER — Ambulatory Visit (HOSPITAL_COMMUNITY)
Admission: EM | Admit: 2020-04-29 | Discharge: 2020-04-29 | Disposition: A | Discharge status: Home / Self Care | Payer: Medicaid Other | Attending: Family Medicine | Admitting: Family Medicine

## 2020-04-29 DIAGNOSIS — R059 Cough, unspecified: Secondary | ICD-10-CM | POA: Diagnosis not present

## 2020-04-29 DIAGNOSIS — U071 COVID-19: Secondary | ICD-10-CM | POA: Diagnosis not present

## 2020-04-29 DIAGNOSIS — E119 Type 2 diabetes mellitus without complications: Secondary | ICD-10-CM | POA: Diagnosis not present

## 2020-04-29 DIAGNOSIS — R5383 Other fatigue: Secondary | ICD-10-CM

## 2020-04-29 DIAGNOSIS — Z20822 Contact with and (suspected) exposure to covid-19: Secondary | ICD-10-CM | POA: Diagnosis not present

## 2020-04-29 LAB — CBG MONITORING, ED: Glucose-Capillary: 117 mg/dL — ABNORMAL HIGH (ref 70–99)

## 2020-04-29 NOTE — Discharge Instructions (Addendum)
Go home to rest Drink plenty of fluids Take Tylenol or ibuprofen for pain or fever You may take over-the-counter cough and cold medicines as needed You must quarantine at home until your test result is available You can check for your test result in MyChart Blood sugar is well controlled at 117

## 2020-04-29 NOTE — ED Triage Notes (Signed)
Pt presents with cough, body aches, fatigue x 2-3 days; shortness of breath on exertion x 1 day. States she was exposed to a coworker with COVID 1 week ago.

## 2020-04-30 LAB — SARS CORONAVIRUS 2 (TAT 6-24 HRS): SARS Coronavirus 2: POSITIVE — AB

## 2020-05-07 ENCOUNTER — Other Ambulatory Visit: Payer: Self-pay

## 2020-05-07 ENCOUNTER — Ambulatory Visit: Payer: Medicaid Other | Admitting: Family Medicine

## 2020-05-07 NOTE — Progress Notes (Deleted)
    SUBJECTIVE:   CHIEF COMPLAINT / HPI:   ***  PERTINENT  PMH / PSH: ***  OBJECTIVE:   There were no vitals taken for this visit.  ***  ASSESSMENT/PLAN:   No problem-specific Assessment & Plan notes found for this encounter.     Dorothyann Gibbs, Medical Student [facility]

## 2020-05-07 NOTE — Progress Notes (Deleted)
    SUBJECTIVE:   CHIEF COMPLAINT / HPI:   ***  PERTINENT  PMH / PSH: ***  OBJECTIVE:   There were no vitals taken for this visit.  ***  ASSESSMENT/PLAN:   No problem-specific Assessment & Plan notes found for this encounter.      N , DO [facility]  

## 2020-05-26 ENCOUNTER — Telehealth: Payer: Self-pay

## 2020-05-26 NOTE — Telephone Encounter (Signed)
Patient calls nurse line requesting letter for work for additional  restroom breaks due to taking diuretics. Spoke with Dr. McDiarmid regarding patient. Provided letter per provider instructions.   Called patient and informed that letter has been placed up front.   Veronda Prude, RN

## 2020-06-11 ENCOUNTER — Encounter (HOSPITAL_COMMUNITY): Payer: Self-pay | Admitting: Emergency Medicine

## 2020-06-11 ENCOUNTER — Other Ambulatory Visit: Payer: Self-pay

## 2020-06-11 ENCOUNTER — Ambulatory Visit (HOSPITAL_COMMUNITY)
Admission: EM | Admit: 2020-06-11 | Discharge: 2020-06-11 | Disposition: A | Discharge status: Home / Self Care | Payer: Medicaid Other | Attending: Family Medicine | Admitting: Family Medicine

## 2020-06-11 DIAGNOSIS — R112 Nausea with vomiting, unspecified: Secondary | ICD-10-CM

## 2020-06-11 DIAGNOSIS — R197 Diarrhea, unspecified: Secondary | ICD-10-CM | POA: Diagnosis not present

## 2020-06-11 LAB — CBG MONITORING, ED: Glucose-Capillary: 177 mg/dL — ABNORMAL HIGH (ref 70–99)

## 2020-06-11 MED ORDER — ONDANSETRON 4 MG PO TBDP: 4.0000 mg | ORAL_TABLET | Freq: Three times a day (TID) | ORAL | 0 refills | Status: DC | PRN

## 2020-06-11 NOTE — ED Provider Notes (Signed)
[facility]    CSN: [id number] Arrival date & time: 06/11/20  1423      History   Chief Complaint Chief Complaint  Patient presents with  . Emesis  . Nausea  . Diarrhea    HPI Teresa Chang is a 50 y.o. female.   Patient presenting today with nausea vomiting and diarrhea that started in the middle of the night.  Some chills but denies fevers, body aches, hematemesis, melena, significant abdominal pain, new sick contacts, urinary symptoms.  She states she has been unable to take any of her diabetes medications today due to the vomiting.  Has not tried anything over-the-counter for symptoms.  Daughter sick with similar symptoms which all seem to start after they ate [employer] last night.  No known chronic GI issues.     Past Medical History:  Diagnosis Date  . Anxiety   . Bipolar disorder (HCC)   . Depression   . Hypertension   . Type 2 diabetes mellitus [facility])     Patient Active Problem List   Diagnosis Date Noted  . Tingling 08/14/2019  . Ear pain 02/28/2019  . Chronic diastolic heart failure (HCC) 06/20/2018  . Mixed hyperlipidemia 06/02/2018  . Type 2 diabetes mellitus without complication, without long-term current use of insulin (HCC) 04/06/2018  . Swelling of lower extremity 10/12/2017  . Health care maintenance 06/18/2013  . Bipolar disorder current episode depressed (HCC) 02/03/2012  . Major depressive disorder, recurrent episode (HCC) 12/20/2011  . Paraspinal muscle spasm 09/10/2011  . Shortness of breath 02/19/2011  . Fatigue 02/19/2011  . ANXIETY DISORDER 07/03/2009  . Essential hypertension, benign 04/04/2007  . VAGINAL DISCHARGE 04/04/2007  . OBESITY, NOS 06/09/2006  . TOBACCO DEPENDENCE 06/09/2006  . OSTEOARTHRITIS, MULTI SITES 06/09/2006    History reviewed. No pertinent surgical history.  OB History   No obstetric history on file.      Home Medications    Prior to Admission medications   Medication Sig Start Date End Date  Taking? Authorizing Provider  ondansetron (ZOFRAN ODT) 4 MG disintegrating tablet Take 1 tablet (4 mg total) by mouth every 8 (eight) hours as needed for nausea or vomiting. 06/11/20  Yes Particia Nearing, PA-C  ACCU-CHEK AVIVA PLUS test strip TEST BLOOD SUGAR LEVELS AS DIRECTED 08/27/19   Caro Laroche, DO  Accu-Chek Softclix Lancets lancets Use daily. E11.9 06/27/19   Caro Laroche, DO  Cholecalciferol (VITAMIN D3) 1.25 MG (50000 UT) CAPS TAKE 1 TABLET BY MOUTH 1 TIME A WEEK FOR 8 WEEKS 08/07/19   Rumball, Darl Householder, DO  fluticasone (FLONASE) 50 MCG/ACT nasal spray Place 1 spray into both nostrils daily. 12/14/19   Lamptey, Britta Mccreedy, MD  furosemide (LASIX) 20 MG tablet Take one tablet every other day as needed for swelling. 08/14/19   Caro Laroche, DO  JARDIANCE 25 MG TABS tablet TAKE 1 TABLET BY MOUTH DAILY 06/18/19   Caro Laroche, DO  lisinopril (ZESTRIL) 20 MG tablet TAKE 1 TABLET(20 MG) BY MOUTH DAILY 01/28/20   Shirlean Mylar, MD  loratadine (CLARITIN) 10 MG tablet Take 1 tablet (10 mg total) by mouth daily. 12/14/19   Merrilee Jansky, MD  metFORMIN (GLUCOPHAGE-XR) 500 MG 24 hr tablet TAKE 2 TABLETS(1000 MG) BY MOUTH TWICE DAILY 01/26/20   Shirlean Mylar, MD  rosuvastatin (CRESTOR) 20 MG tablet TAKE 1 TABLET(20 MG) BY MOUTH DAILY 01/20/20   Shirlean Mylar, MD    Family History Family History  Problem Relation Age of  Onset  . Stroke Maternal Grandmother     Social History Social History   Tobacco Use  . Smoking status: Former Smoker    Packs/day: 0.30  . Smokeless tobacco: Never Used  Substance Use Topics  . Alcohol use: No  . Drug use: No     Allergies   Patient has no known allergies.   Review of Systems Review of Systems Per HPI Physical Exam Triage Vital Signs ED Triage Vitals  Enc Vitals Group     BP 06/11/20 1453 125/83     Pulse Rate 06/11/20 1453 (!) 110     Resp 06/11/20 1453 18     Temp 06/11/20 1453 98.6 F (37 C)     Temp Source  06/11/20 1453 Oral     SpO2 06/11/20 1453 97 %     Weight --      Height --      Head Circumference --      Peak Flow --      Pain Score 06/11/20 1449 0     Pain Loc --      Pain Edu? --      Excl. in GC? --    No data found.  Updated Vital Signs BP 125/83 (BP Location: Right Arm)   Pulse (!) 110   Temp 98.6 F (37 C) (Oral)   Resp 18   SpO2 97%   Visual Acuity Right Eye Distance:   Left Eye Distance:   Bilateral Distance:    Right Eye Near:   Left Eye Near:    Bilateral Near:     Physical Exam Vitals and nursing note reviewed.  Constitutional:      Appearance: Normal appearance. She is not ill-appearing.  HENT:     Head: Atraumatic.     Mouth/Throat:     Mouth: Mucous membranes are moist.     Pharynx: Oropharynx is clear.  Eyes:     Extraocular Movements: Extraocular movements intact.     Conjunctiva/sclera: Conjunctivae normal.  Cardiovascular:     Rate and Rhythm: Normal rate and regular rhythm.     Heart sounds: Normal heart sounds.  Pulmonary:     Effort: Pulmonary effort is normal.     Breath sounds: Normal breath sounds.  Abdominal:     General: Bowel sounds are normal. There is no distension.     Palpations: Abdomen is soft.     Tenderness: There is no abdominal tenderness. There is no right CVA tenderness, left CVA tenderness or guarding.  Musculoskeletal:        General: Normal range of motion.     Cervical back: Normal range of motion and neck supple.  Skin:    General: Skin is warm and dry.  Neurological:     Mental Status: She is alert and oriented to person, place, and time.  Psychiatric:        Mood and Affect: Mood normal.        Thought Content: Thought content normal.        Judgment: Judgment normal.      UC Treatments / Results  Labs (all labs ordered are listed, but only abnormal results are displayed) Labs Reviewed  CBG MONITORING, ED - Abnormal; Notable for the following components:      Result Value   Glucose-Capillary  177 (*)    All other components within normal limits    EKG   Radiology No results found.  Procedures Procedures (including critical care time)  Medications Ordered  in UC Medications - No data to display  Initial Impression / Assessment and Plan / UC Course  I have reviewed the triage vital signs and the nursing notes.  Pertinent labs & imaging results that were available during my care of the patient were reviewed by me and considered in my medical decision making (see chart for details).     Exam reassuring today, point-of-care glucose 177.  Suspect foodborne particularly as she and her daughter have same symptoms and ate same food for dinner last night.  We will treat with as needed Zofran, bland diet, push fluids.  Work note given.  Return for acutely worsening symptoms. Final Clinical Impressions(s) / UC Diagnoses   Final diagnoses:  Nausea vomiting and diarrhea   Discharge Instructions   None    ED Prescriptions    Medication Sig Dispense Auth. Provider   ondansetron (ZOFRAN ODT) 4 MG disintegrating tablet Take 1 tablet (4 mg total) by mouth every 8 (eight) hours as needed for nausea or vomiting. 20 tablet Particia Nearing, [place]     PDMP not reviewed this encounter.   Particia Nearing, [place] 06/11/20 1540

## 2020-06-11 NOTE — ED Triage Notes (Signed)
Pt presents with N,V,D and abdominal cramping that started last night after eating chickfila. Pts daughter also has the same symptoms.   States has not been able to keep anything down and has not had diabetic meds today.

## 2020-07-29 ENCOUNTER — Other Ambulatory Visit: Payer: Self-pay | Admitting: Family Medicine

## 2020-07-29 DIAGNOSIS — E119 Type 2 diabetes mellitus without complications: Secondary | ICD-10-CM

## 2020-08-06 ENCOUNTER — Other Ambulatory Visit: Payer: Self-pay

## 2020-08-06 ENCOUNTER — Encounter: Payer: Self-pay | Admitting: Family Medicine

## 2020-08-06 ENCOUNTER — Ambulatory Visit (INDEPENDENT_AMBULATORY_CARE_PROVIDER_SITE_OTHER): Payer: Medicaid Other | Admitting: Family Medicine

## 2020-08-06 VITALS — BP 124/74 | HR 100 | Wt 192.0 lb

## 2020-08-06 DIAGNOSIS — F339 Major depressive disorder, recurrent, unspecified: Secondary | ICD-10-CM

## 2020-08-06 DIAGNOSIS — R5383 Other fatigue: Secondary | ICD-10-CM | POA: Diagnosis not present

## 2020-08-06 DIAGNOSIS — E119 Type 2 diabetes mellitus without complications: Secondary | ICD-10-CM | POA: Diagnosis not present

## 2020-08-06 LAB — POCT GLYCOSYLATED HEMOGLOBIN (HGB A1C): HbA1c, POC (controlled diabetic range): 12.7 % — AB (ref 0.0–7.0)

## 2020-08-06 LAB — POCT CBG (FASTING - GLUCOSE)-MANUAL ENTRY: Glucose Fasting, POC: 271 mg/dL — AB (ref 70–99)

## 2020-08-06 MED ORDER — INSULIN GLARGINE 100 UNIT/ML ~~LOC~~ SOLN: 10.0000 [IU] | Freq: Every day | SUBCUTANEOUS | 11 refills | Status: DC

## 2020-08-06 NOTE — Patient Instructions (Addendum)
Thank you for coming to see me today. It was a pleasure. Today we discussed your diabetes. It is very high, I think this is why you are so tired. I recommend starting lantus 10 units from today. Please continue metformin and jardiance as prescribed. Follow up with Dr Raymondo Band tomorrow for the diabetes.  I also recommend therapy, I will give you a list of resources below. Please also consider going on anti depressant for your mood.  Please start taking vitamin D daily.   Please follow-up with your PCP in 2 weeks.   If you have any questions or concerns, please do not hesitate to call the office at [phone].  Best wishes,   Dr Allena Katz     Therapy and Counseling Resources Most providers on this list will take Medicaid. Patients with commercial insurance or Medicare should contact their insurance company to get a list of in network providers.  [facility]Psychiatry and Counseling [address] [place], [place] [address] [phone]  [facility]  [address] [place], [place] [address]      [phone]  [facility] [address]  [place], [place] [address] [phone]  [facility] [address], [address]  [place], [place] [address]       [phone]     [facility] (virtual only) [phone]  Jovita Kussmaul [facility] [address] [place], [place] [phone]  [facility]:  [address] [place] [phone]  [facility]:  [address] [place] [phone]  [facility] (under & uninsured) [address]   [place] [phone]    [email or web address]    [facility] [phone] [address] . [place]    [phone]  [facility] -[address]     Phone:  [phone]     [facility]-  [address]  [phone]   [facility]      [place], [place] [phone] ext [phone]  [facility]: [address] [place], [place], [place]  [phone]   [facility] (Spanish therapist) [email or web address]  [address]   [place] [address]    [phone]    [facility]   [address],  [place], [place]  [phone]   [facility]  [address] [place]  [phone]  [facility]  [address] [place], [place]        [phone]  Open Access/Walk In Clinic under & uninsured  [facility]  [address] [place], [place] [phone] Crisis [phone]  [facility] [place],  (Spanish)   [address], [place]: [phone]) 8:30 - 12; 1 - 2:30  [facility] [employer],  [address] [place], [place]    ([phone]):8:30 - 12; 2 - 3PM  [facility] [employer],  [address],  [place]; [phone]):   Mon - Fri 8 AM - 5 PM  [facility] [address] [place]  MWF 12:30 to 3:00 or call to schedule an appointment  [phone]  Specific Provider options Psychology Today  [email or web address].psychologytoday.[email or web address]/us 1. click on find a therapist  2. enter your zip code 3. left side and select or tailor a therapist for your specific need.  [facility] Provider Directory [email or web address]  (Medicaid)   Follow all drop down to find a provider  Social Support program [facility] [place] [phone] or PhotoSolver.pl [address], [place], [place] Recovery support and educational   24- Hour Availability:  .  Marland Kitchen [facility]  . [address] [place], [place] [employer] [phone] Crisis [phone]  . [facility] [phone]  [facility] Service  [phone]   . [facility] [employer]   [phone] (after hours)  . Therapeutic Alternative/Mobile Crisis   [phone]  . [place] National Suicide Hotline  6314905538 (TALK)  . Call 911 or go to emergency room  . [employer]  [phone]);  [place] and [employer]   . [facility]  [phone]); [place], [place], [place], [place], [place], [place], [place]

## 2020-08-06 NOTE — Progress Notes (Addendum)
       SUBJECTIVE:   CHIEF COMPLAINT / HPI:   Teresa Chang is a 50 y.o. female presents for diabetic follow up  Diabetes Patient's current diabetic medications include metformin and jardiance.  Patient is not compliant with these medications. Not checking CBGs at home due to machine issue. Machine was discontinued.  Current A1c today is 12.7. Denies abdominal pain, blurred vision, polydipsia or hypoglycemia sx. Pt feels tired and has polyuria, polydipsia.   Patient states they understand that diet and exercise can help with her diabetes.  Fatigue  Pt has felt tired for many months, especially after the passing of her mom last year. No alleviating or exacerbating factors. She feels like she needs a break from work to get her life together.   Low mood Pt has felt low in mood since her mom passed away last year. She was very close with her and her loss has been devastating. Pt very tearful. Denies active or passive SI. Declined starting anti-depressant. Is open to doing therapy. Flowsheet Row Office Visit from 08/06/2020 in [facility]  PHQ-9 Total Score 16     PERTINENT  PMH / PSH: Diabetes   OBJECTIVE:   BP 124/74   Pulse 100   Wt 192 lb (87.1 kg)   LMP 07/02/2020 (Approximate)   SpO2 100%   BMI 29.19 kg/m    General: Alert, tearful at times Cardio: well perfused Pulm:  normal work of breathing  Neuro: Cranial nerves grossly intact   ASSESSMENT/PLAN:   Type 2 diabetes mellitus without complication, without long-term current use of insulin (HCC) A1c 12.7 today, worsened from 7.3 previously likely 2/2 medication non compliance. Recommended restarting Metformin 1000mg  BID, Jardiance 25mg  and Lantus 10 units daily. Follow up appointment booked with Dr on 08/07/20 to help titrate lantus. Pt should follow up with PCP thereafter.  Fatigue Likely multifactorial 2/2 worsening of diabetes and depression. Would like to rule out organic causes such as  anemia, thyroid disorders etc so obtained CBC, BMP, iron, B12 and folate levels.   Major depressive disorder, recurrent episode Pt very tearful today when speaking about the loss of her mom. PHQ 16. No active or passive SI. Discussed option of starting SSRI pt declined. Pt is open to therapy and I strongly encouraged this. Provided therapy resources. Recommend follow up in 2 weeks with PCP.    Raymondo Band, MD PGY-2 [facility]

## 2020-08-07 ENCOUNTER — Other Ambulatory Visit: Payer: Self-pay | Admitting: Family Medicine

## 2020-08-07 ENCOUNTER — Encounter: Payer: Self-pay | Admitting: Family Medicine

## 2020-08-07 ENCOUNTER — Ambulatory Visit (INDEPENDENT_AMBULATORY_CARE_PROVIDER_SITE_OTHER): Payer: Medicaid Other | Admitting: Pharmacist

## 2020-08-07 ENCOUNTER — Other Ambulatory Visit: Payer: Self-pay

## 2020-08-07 ENCOUNTER — Encounter: Payer: Self-pay | Admitting: Pharmacist

## 2020-08-07 DIAGNOSIS — E119 Type 2 diabetes mellitus without complications: Secondary | ICD-10-CM

## 2020-08-07 DIAGNOSIS — E559 Vitamin D deficiency, unspecified: Secondary | ICD-10-CM | POA: Diagnosis not present

## 2020-08-07 LAB — BASIC METABOLIC PANEL
BUN/Creatinine Ratio: 12 (ref 9–23)
BUN: 11 mg/dL (ref 6–24)
CO2: 19 mmol/L — ABNORMAL LOW (ref 20–29)
Calcium: 9.5 mg/dL (ref 8.7–10.2)
Chloride: 103 mmol/L (ref 96–106)
Creatinine, Ser: 0.9 mg/dL (ref 0.57–1.00)
Glucose: 279 mg/dL — ABNORMAL HIGH (ref 65–99)
Potassium: 4.4 mmol/L (ref 3.5–5.2)
Sodium: 138 mmol/L (ref 134–144)
eGFR: 77 mL/min/{1.73_m2} (ref >59)

## 2020-08-07 LAB — CBC
Hematocrit: 40.7 % (ref 34.0–46.6)
Hemoglobin: 12.8 g/dL (ref 11.1–15.9)
MCH: 25.4 pg — ABNORMAL LOW (ref 26.6–33.0)
MCHC: 31.4 g/dL — ABNORMAL LOW (ref 31.5–35.7)
MCV: 81 fL (ref 79–97)
Platelets: 230 10*3/uL (ref 150–450)
RBC: 5.03 x10E6/uL (ref 3.77–5.28)
RDW: 13.3 % (ref 11.7–15.4)
WBC: 7.4 10*3/uL (ref 3.4–10.8)

## 2020-08-07 LAB — VITAMIN D 25 HYDROXY (VIT D DEFICIENCY, FRACTURES): Vit D, 25-Hydroxy: 21.5 ng/mL — ABNORMAL LOW (ref 30.0–100.0)

## 2020-08-07 LAB — VITAMIN B12: Vitamin B-12: 348 pg/mL (ref 232–1245)

## 2020-08-07 LAB — FOLATE: Folate: 17.6 ng/mL (ref >3.0)

## 2020-08-07 LAB — IRON: Iron: 46 ug/dL (ref 27–159)

## 2020-08-07 MED ORDER — VITAMIN D3 1.25 MG (50000 UT) PO CAPS: ORAL_CAPSULE | ORAL | 1 refills | Status: DC

## 2020-08-07 MED ORDER — ACCU-CHEK GUIDE VI STRP: ORAL_STRIP | 12 refills | Status: DC

## 2020-08-07 MED ORDER — ACCU-CHEK GUIDE W/DEVICE KIT: 1.0000 | PACK | Freq: Once | 0 refills | Status: AC

## 2020-08-07 NOTE — Assessment & Plan Note (Addendum)
Pt very tearful today when speaking about the loss of her mom. PHQ 16. No active or passive SI. Discussed option of starting SSRI pt declined. Pt is open to therapy and I strongly encouraged this. Provided therapy resources. Recommend follow up in 2 weeks with PCP.

## 2020-08-07 NOTE — Patient Instructions (Addendum)
It was a pleasure seeing you! Today we would like you to start taking metformin 2 tablets twice a day (4 tablets total per day) as prescribed. We would also like you start taking your Lantus 20 units daily. You can take this dose as soon as you get home this afternoon. We would then like you to increase this dose by 2 units per day until your blood sugar is <120. For example your dose today when you get home will be 20 units, your dose tomorrow will be 22 units, your dose Saturday will be 24 units, etc. We will be sending a new prescription for a blood sugar meter to your pharmacy today as well as testing strips. Continue to monitor your blood sugar at least daily. We also recommend reducing your soda intake. You can drink diet or sugar free soda as well as Gatorade 0 calorie.  We also will be restarting your Vitamin D. You will take this just like you did before, once weekly for 8 weeks.   Dr. Raymondo Band will follow up with you next week via telephone to discuss blood sugar readings and plan for insulin therapy.

## 2020-08-07 NOTE — Progress Notes (Signed)
S:     Chief Complaint  Patient presents with  . Medication Management    Diabetes f/u, insulin start    Patient arrives in a good mood without assistance. Presents for diabetes evaluation, education, and management. Patient was referred and last seen by Primary Care Provider on 08/06/20.  Patient indicates she has not been doing well in regards to her diabetes control. She believes began to poorly manage her diabetes when her mother passed away last year. Patient indicates she is very tired, has increased urinary frequency (urinating 6x per night), and has increased hunger/thirst. She does not check blood glucose but reports that she can tell her glucose is high based on how she feels. She is highly motivated to regain control of her diabetes as it is currently impacting her quality of life. Patient has been drinking soda (full sugar) - specifically Pepsi and [facility] - over the past year. Patient indicated she restarted taking Lantus last night; she took 20 units around 8:30 PM. Patient reports that she was instructed to take one metformin 500mg  tablet BID back when her diabetes was well controlled and patient has continued with this dosage since that time. Patient also discussed desire to restart vitamin D3 because it "makes her feel better."  Insurance coverage/medication affordability: Medicaid   Medication adherence reported no issue.   Current diabetes medications include: metformin 500 MG BID, empagliflozin (Jardiance) 25mg  once daily, insulin glargine (Lantus) 10 units daily  Current hypertension medications include: lisinopril 20mg  daily Current hyperlipidemia medications include: rosuvastatin 20mg  daily  Patient denies hypoglycemic events.   Patient reports nocturia (nighttime urination) up to 6 times per night.  Patient reports neuropathy (nerve pain). Very rarely, last reported around 2 weeks ago.  Patient denies visual changes. Patient reports self foot exams.    O:   Physical Exam Constitutional:      Appearance: Normal appearance.  Pulmonary:     Effort: Pulmonary effort is normal.  Neurological:     Mental Status: She is alert.  Psychiatric:        Mood and Affect: Mood normal.        Behavior: Behavior normal.    Review of Systems  Constitutional: Positive for malaise/fatigue.     Lab Results  Component Value Date   HGBA1C 12.7 (A) 08/06/2020    Lipid Panel     Component Value Date/Time   CHOL 133 02/28/2019 1342   TRIG 91 02/28/2019 1342   HDL 48 02/28/2019 1342   CHOLHDL 2.8 02/28/2019 1342   CHOLHDL 4.3 05/25/2016 1620   VLDL 39 (H) 05/25/2016 1620   LDLCALC 68 02/28/2019 1342    Patient does not take home blood glucose readings. States the testing strips needed for her meter are discontinued.     Clinical Atherosclerotic Cardiovascular Disease (ASCVD): No  The 10-year ASCVD risk score 03/02/2019 DC Jr., et al., 2013) is: 6.3%   Values used to calculate the score:     Age: 50 years     Sex: Female     Is Non-Hispanic African American: Yes     Diabetic: Yes     Tobacco smoker: No     Systolic Blood Pressure: 124 mmHg     Is BP treated: Yes     HDL Cholesterol: 48 mg/dL     Total Cholesterol: 133 mg/dL    A/P: Diabetes longstanding and currently uncontrolled likely related to stress, dietary indiscretion and sedentary lifestyle. Patient is able to verbalize appropriate hypoglycemia  management plan. Medication adherence appears good.  -Increased dose of basal insulin Lantus (insulin glargine) to 20 units. Patient will continue to titrate 2 units every day if fasting blood sugar > 120mg /dl.  -Continued empagliflozin (Jardiance) 25mg  daily -Increased dose of metformin to 500mg  two tablets BID.  -Discussed with patient that should insulin bring her blood glucose levels down quickly but that other options such as a GLP-1 agonist are available and may be appropriate at her next office visit.   -Plan to call patient to  discuss BG readings and progress in one week Counseled patient to decrease soda consumption  -Next A1C anticipated in three months.   Serum vitamin D was low at 21.5 ng/mL yesterday on 08/06/2020.  History of Vitamin D deficiency. -Restarted cholecalciferol Vitamin D3 50,000 UT once weekly for 8 weeks  ASCVD risk - primary prevention in patient with diabetes. Last LDL is controlled. ASCVD risk score is not >20%  - high intensity statin indicated. Aspirin is not indicated.  -Continued rosuvastatin 20mg  daily  Hypertension longstanding and currently controlled.  Blood pressure goal = <140/90 mmHg. Medication adherence appears good.   -Continued lisinopril 20mg  daily  Written patient instructions provided.  Total time in face to face counseling 25 minutes.   Follow up Pharmacist call in one week.   Patient seen with , PharmD Candidate and , PGY1 Resident

## 2020-08-07 NOTE — Assessment & Plan Note (Signed)
Diabetes longstanding and currently uncontrolled likely related to stress, dietary indiscretion and sedentary lifestyle. Patient is able to verbalize appropriate hypoglycemia management plan. Medication adherence appears good.  -Increased dose of basal insulin Lantus (insulin glargine) to 20 units. Patient will continue to titrate 2 units every day if fasting blood sugar > 120mg /dl.  -Continued empagliflozin (Jardiance) 25mg  daily -Increased dose of metformin to 500mg  two tablets BID.  -Discussed with patient that should insulin bring her blood glucose levels down quickly but that other options such as a GLP-1 agonist are available and may be appropriate at her next office visit.   -Plan to call patient to discuss BG readings and progress in one week Counseled patient to decrease soda consumption  -Next A1C anticipated in three months.

## 2020-08-07 NOTE — Assessment & Plan Note (Signed)
Likely multifactorial 2/2 worsening of diabetes and depression. Would like to rule out organic causes such as anemia, thyroid disorders etc so obtained CBC, BMP, iron, B12 and folate levels.

## 2020-08-07 NOTE — Assessment & Plan Note (Signed)
A1c 12.7 today, worsened from 7.3 previously likely 2/2 medication non compliance. Recommended restarting Metformin 1000mg  BID, Jardiance 25mg  and Lantus 10 units daily. Follow up appointment booked with Dr on 08/07/20 to help titrate lantus. Pt should follow up with PCP thereafter.

## 2020-08-07 NOTE — Assessment & Plan Note (Signed)
>>  ASSESSMENT AND PLAN FOR MAJOR DEPRESSIVE DISORDER, RECURRENT EPISODE (HCC) WRITTEN ON 08/07/2020  8:34 AM BY PATEL, POONAM, MD  Pt very tearful today when speaking about the loss of her mom. PHQ 16. No active or passive SI. Discussed option of starting SSRI pt declined. Pt is open to therapy and I strongly encouraged this. Provided therapy resources. Recommend follow up in 2 weeks with PCP.

## 2020-08-08 ENCOUNTER — Encounter: Payer: Self-pay | Admitting: *Deleted

## 2020-08-11 NOTE — Progress Notes (Signed)
Reviewed: I agree with Dr. Koval's documentation and management. 

## 2020-08-12 ENCOUNTER — Telehealth: Payer: Self-pay

## 2020-08-12 NOTE — Telephone Encounter (Signed)
Pharmacy calls nurse line requesting a new prescription for the Lantus Solstar Pen. The vial was sent in last week, however the patient prefers the pen. Please advise.

## 2020-08-14 ENCOUNTER — Telehealth: Payer: Self-pay | Admitting: Pharmacist

## 2020-08-14 DIAGNOSIS — E119 Type 2 diabetes mellitus without complications: Secondary | ICD-10-CM

## 2020-08-14 MED ORDER — INSULIN GLARGINE 100 UNIT/ML SOLOSTAR PEN: 20.0000 [IU] | PEN_INJECTOR | SUBCUTANEOUS | 1 refills | Status: DC

## 2020-08-14 MED ORDER — BD PEN NEEDLE NANO U/F 32G X 4 MM MISC: 1.0000 | 99 refills | Status: DC | PRN

## 2020-08-14 NOTE — Telephone Encounter (Signed)
-----   Message from Kathrin Ruddy, RPH-CPP sent at 08/07/2020  2:09 PM EDT ----- Regarding: DM follow-up Lantus dosing

## 2020-08-14 NOTE — Telephone Encounter (Signed)
Noted and agree. 

## 2020-08-14 NOTE — Telephone Encounter (Signed)
Contacted patient in follow-up of visits for hyperglycemia with PCP, Dr. Allena Katz on 4/27 AND with me on 4/28.    Since that time, patient has not yet started taking lantus insulin because the supply sent was in vials.  She reports that she now has a blood glucose meter that is working and had a fasting reading > 300 yesterday.She requested our office send pens to the pharmacy and I have agreed to send the pens today.  New prescription provided.     She agreed to pick up Lantus Solostar AND needle tips AND begin today - dose 20 units. Patient verbalized understanding of treatment plan including insulin dose adjustments.  She also requested a new "out of work" letter as her last letter was scheduled to have her to return to work tomorrow.  She is apprehensive about returning to work with her elevated blood sugar readings.  I shared with her that I would forward the request for a new OUT OF WORK letter to her PCP, Dr. Allena Katz.    Patient and I agreed on a 1 week follow-up of her blood sugar.  Phone call follow-up planned.

## 2020-08-15 NOTE — Telephone Encounter (Signed)
Patient calls nurse line checking the status of work excuse. Patient reports she was supposed to be out for 7 days while on "treatment," however she just received her medication yesterday. Patient is requesting a note to return to work on 5/16. This will allow her ~ 7 days with medication. Will forward to [facility] and PCP.

## 2020-08-16 ENCOUNTER — Other Ambulatory Visit: Payer: Self-pay | Admitting: Family Medicine

## 2020-08-16 NOTE — Telephone Encounter (Signed)
Provided pt with work note on epic. Please could you inform her? She can print it off at home from [employer]. Thank you.

## 2020-08-18 ENCOUNTER — Encounter: Payer: Self-pay | Admitting: Family Medicine

## 2020-08-18 ENCOUNTER — Ambulatory Visit (INDEPENDENT_AMBULATORY_CARE_PROVIDER_SITE_OTHER): Payer: Medicaid Other | Admitting: Family Medicine

## 2020-08-18 ENCOUNTER — Telehealth: Payer: Self-pay | Admitting: Family Medicine

## 2020-08-18 ENCOUNTER — Other Ambulatory Visit: Payer: Self-pay

## 2020-08-18 VITALS — BP 110/75 | HR 100 | Ht 67.0 in | Wt 192.8 lb

## 2020-08-18 DIAGNOSIS — R300 Dysuria: Secondary | ICD-10-CM | POA: Diagnosis not present

## 2020-08-18 DIAGNOSIS — R3129 Other microscopic hematuria: Secondary | ICD-10-CM

## 2020-08-18 DIAGNOSIS — N898 Other specified noninflammatory disorders of vagina: Secondary | ICD-10-CM

## 2020-08-18 LAB — POCT URINALYSIS DIP (MANUAL ENTRY)
Bilirubin, UA: NEGATIVE
Glucose, UA: >=1000 mg/dL — AB
Ketones, POC UA: NEGATIVE mg/dL
Leukocytes, UA: NEGATIVE
Nitrite, UA: NEGATIVE
Protein Ur, POC: 30 mg/dL — AB
Spec Grav, UA: 1.02 (ref 1.010–1.025)
Urobilinogen, UA: 0.2 E.U./dL
pH, UA: 5.5 (ref 5.0–8.0)

## 2020-08-18 LAB — POCT UA - MICROSCOPIC ONLY: RBC, Urine, Miroscopic: >20 (ref 0–2)

## 2020-08-18 MED ORDER — FLUCONAZOLE 150 MG PO TABS: ORAL_TABLET | ORAL | 0 refills | Status: DC

## 2020-08-18 MED ORDER — CEPHALEXIN 500 MG PO CAPS: 500.0000 mg | ORAL_CAPSULE | Freq: Four times a day (QID) | ORAL | 0 refills | Status: AC

## 2020-08-18 NOTE — Assessment & Plan Note (Addendum)
Acute. No systemic symptoms. UA negative except for glucosuria. Also endorses itching - given uncontrolld diabetes she is at high risk for yeast infection. - empirically treat with Keflex 500mg  QID x 5 days - empirically treat with Diflucan x 2  - follow up scheduled with Dr. for diabetes management - encouraged to stay well hydrated - follow up urine culture; adjust treatment if indicated - follow if no improvement, sooner if worsening - recommend repeat UA at some point to evaluate for resolution of microscopic hematuria

## 2020-08-18 NOTE — Assessment & Plan Note (Signed)
Likely in setting of UTI. Recommend repeat after treatment to ensure resolution

## 2020-08-18 NOTE — Progress Notes (Signed)
   Subjective:   Patient ID: Teresa Chang    DOB: Sep 30, 1970, 50 y.o. female   MRN: [id number]  Teresa Chang is a 50 y.o. female here for dysuria  Dysuria: Patient endorses dysuria, frequency, pelvic pain x 1 day. Denies fever, chills, flank pain. Endorses uncontrolled diabetes. Also endorses itching.  She is also dropping off FMLA/short term disability forms to have completed by PCP for uncontrolled diabetes. Forms have been given to front staff team to be completed as soon as possible.  Review of Systems:  Per HPI.   Objective:   BP 110/75   Pulse 100   Ht 5\' 7"  (1.702 m)   Wt 192 lb 12.8 oz (87.5 kg)   SpO2 97%   BMI 30.20 kg/m  Vitals and nursing note reviewed.  General: pleasant older woman, sitting comfortably in exam chair, well nourished, well developed, in no acute distress with non-toxic appearance Resp: breathing comfortably on room air, speaking in full sentences Abdomen: very mild suprapubic pain Skin: warm, dry Extremities: warm and well perfused, normal tone MSK:  No flank pain bilaterally, gait normal Neuro: Alert and oriented, speech normal  Assessment & Plan:   Dysuria Acute. No systemic symptoms. UA negative except for glucosuria. Also endorses itching - given uncontrolld diabetes she is at high risk for yeast infection. - empirically treat with Keflex 500mg  QID x 5 days - empirically treat with Diflucan x 2  - follow up scheduled with Dr. for diabetes management - encouraged to stay well hydrated - follow up urine culture; adjust treatment if indicated - follow if no improvement, sooner if worsening - recommend repeat UA at some point to evaluate for resolution of microscopic hematuria   Microscopic hematuria Likely in setting of UTI. Recommend repeat after treatment to ensure resolution   Orders Placed This Encounter  Procedures  . Urine Culture  . POCT UA - Microscopic Only  . POCT urinalysis dipstick   Meds ordered this encounter   Medications  . cephALEXin (KEFLEX) 500 MG capsule    Sig: Take 1 capsule (500 mg total) by mouth 4 (four) times daily for 5 days.    Dispense:  20 capsule    Refill:  0  . fluconazole (DIFLUCAN) 150 MG tablet    Sig: Take 1 tablet now, then 1 tablet in 72 hours    Dispense:  2 tablet    Refill:  0      , DO PGY-3, [facility] 08/18/2020 2:39 PM

## 2020-08-18 NOTE — Telephone Encounter (Signed)
Disability  form dropped off for at front desk for completion.  Verified that patient section of form has been completed.  Last DOS/WCC with PCP was 08/06/2020  Placed form in team folder to be completed by clinical staff.  Teresa Chang

## 2020-08-20 ENCOUNTER — Telehealth: Payer: Self-pay

## 2020-08-20 LAB — URINE CULTURE

## 2020-08-20 NOTE — Telephone Encounter (Signed)
Clinical info completed on  form.  Place form in Dr. Isa Rankin box for completion.  Sunday Spillers, CMA

## 2020-08-20 NOTE — Telephone Encounter (Signed)
Patient calls nurse line regarding results from urine culture. Patient has questions regarding medication management for UTI.   Please advise if antibiotic will stay the same or will be changed due to urine culture results.   Forwarding to Dr. Mauri Reading.   Veronda Prude, RN

## 2020-08-21 ENCOUNTER — Telehealth: Payer: Self-pay

## 2020-08-21 ENCOUNTER — Telehealth: Payer: Self-pay | Admitting: Pharmacist

## 2020-08-21 NOTE — Telephone Encounter (Signed)
Patient calls nurse line stating she is supposed to go back to work on 5/16, however she is requesting an extension. Patient states she is suffering from a UTI and trying to get her blood sugar under control. Patient is requesting an extra day and to possibly return on 5/17. We have no apts tomorrow, as we are closing for half day. Will send to covering provider.

## 2020-08-21 NOTE — Telephone Encounter (Signed)
Wrote work excuse through 5/17 as requested. Letter routed to admin to send out via snail mail, or patient may use via MyChart.  Shirlean Mylar, MD [facility], PGY-2

## 2020-08-21 NOTE — Telephone Encounter (Signed)
Contacted patient regarding diabetes management. Reports sugars have been running 240-300 lately. Reports currently taking Lantus 28 units and increasing by 2 units every day up to 40 units daily. Additionally, patient being treated with antibiotics for an UTI. Advised patient to hold SGLT2i Jardiance (empagloflozin) until UTI resolves and sugars are not as elevated. Patient verbalized understanding. Will follow-up with patient Tuesday, 5/17 to assess blood sugar control and need for insulin adjustments.

## 2020-08-21 NOTE — Telephone Encounter (Signed)
Results discussed with patient

## 2020-08-22 ENCOUNTER — Telehealth (INDEPENDENT_AMBULATORY_CARE_PROVIDER_SITE_OTHER): Payer: Medicaid Other | Admitting: Pharmacist

## 2020-08-22 ENCOUNTER — Ambulatory Visit: Payer: Medicaid Other | Admitting: Family Medicine

## 2020-08-22 ENCOUNTER — Encounter: Payer: Self-pay | Admitting: Family Medicine

## 2020-08-22 DIAGNOSIS — E119 Type 2 diabetes mellitus without complications: Secondary | ICD-10-CM

## 2020-08-22 NOTE — Assessment & Plan Note (Signed)
Patient reports doing much better today.  She reports much improved symptoms (less urgency, irritation) and also reports her blood fasting blood sugar was 167 this AM.   She reports drinking "a lot of water" yesterday.  She reports that she stopped drinking soda yesterday.     She continues taking her cephalexin four times daily AND has taken the first of her fluconazole doses.  We discussed need to complete the courses of these agents.   She continues to hold Jardiance (empagliflozin) at this time.  We discussed consideration of restarting the Jardiance only after we have consistent fasting blood glucose readings < 200 AND waiting until at least 5/23.   She will take 28 units of Lantus (insulin glargine) this AM.  We discussed that her infection resolution may improve her blood sugar control in the next few days.    Advised to continue drinking water frequently throughout the day.  She was also advised to avoid regular soda.  Lantus dose instruction was to increase by 2 units tomorrow and/or Sunday if fasting reading was > 200.  If less than 200 she was advised to continue with 28 units of Lantus daily.   Plan to have pharmacist, Fabio Neighbors, PharmD, contact patient by phone in 6 days and determine if additional insulin adjustment is needed at that time.

## 2020-08-22 NOTE — Progress Notes (Signed)
Reviewed: I agree with Dr. Koval's documentation and management. 

## 2020-08-22 NOTE — Progress Notes (Addendum)
Visit conducted by telephone.   AV did not work.   Location I conducted interview from my office.  Patient was at home.   Patient reports doing much better today.  She reports much improved symptoms (less urgency, irritation) and also reports her blood fasting blood sugar was 167 this AM.   She reports drinking "a lot of water" yesterday.  She reports that she stopped drinking soda yesterday.     She continues taking her cephalexin four times daily AND has taken the first of her fluconazole doses.  We discussed need to complete the courses of these agents.   She continues to hold Jardiance (empagliflozin) at this time.  We discussed consideration of restarting the Jardiance only after we have consistent fasting blood glucose readings < 200 AND waiting until at least 5/23.   She will take 28 units of Lantus (insulin glargine) this AM.  We discussed that her infection resolution may improve her blood sugar control in the next few days.    Advised to continue drinking water frequently throughout the day.  She was also advised to avoid regular soda.  Lantus dose instruction was to increase by 2 units tomorrow and/or Sunday if fasting reading was > 200.  If less than 200 she was advised to continue with 28 units of Lantus daily.   Plan to have pharmacist, Fabio Neighbors, PharmD, contact patient by phone in 6 days and determine if additional insulin adjustment is needed at that time.    Total time with Telephone encounter - with patient - was 14 minutes.

## 2020-08-22 NOTE — Patient Instructions (Signed)
Continue titrating insulin by 2 units if fasting CBGs > 200.  Continue to hold Jardiance.   Follow-up by phone planned for next week.

## 2020-08-22 NOTE — Telephone Encounter (Signed)
Discussed with patient on telephone and completed short term disability and FMLA forms. Patient had DM that became uncontrolled recently and developed UTI as a result. 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 Her BG is improving and she is on treatment for UTI. Recommend she stay out of work for 2 weeks until this episode is resolved. Wrote letter to reflect this, completed FMLA and short term disability forms- placed in To Fax Pile and RN folder as requested by patient.   Shirlean Mylar, MD [facility], PGY-2

## 2020-08-26 ENCOUNTER — Ambulatory Visit: Payer: Medicaid Other | Admitting: Family Medicine

## 2020-08-26 NOTE — Telephone Encounter (Signed)
Patient informed of forms faxed and a copy for her is up front. I made a copy for batch scanning as well.

## 2020-08-28 ENCOUNTER — Telehealth: Payer: Self-pay | Admitting: Pharmacist

## 2020-08-28 DIAGNOSIS — E119 Type 2 diabetes mellitus without complications: Secondary | ICD-10-CM

## 2020-08-28 NOTE — Telephone Encounter (Signed)
Visit conducted by telephone.  Patient reports still increasing Lantus by 2 units every day due to elevated BG - currently at 34 units today (AM). Reports taking metformin 1000 mg twice daily and holding Jardiance. Reports fasting BG 105-194 and post-prandial BG 264, 283, 304, 160, 194, 196. Additionally, patient checked BG while on the phone with me and BG was 298. Denies BG <70. Reports cutting out juice and soda, and primarily drinking water.   Plan: 1. Increased Lantus (insulin glargine) from 34 units to 38 units daily. Continue titrating insulin by 2 units if fasting CBGs > 200. 2. Continued metformin 1000 mg twice daily 3. Continued Jardiance (empagliflozin). Will consider restarting Jardiance only after having consistent fasting blood glucose readings < 200 AND waiting until at least 5/23.   Plan to have Dr. Raymondo Band contact patient by phone on Monday 5/23 and determine if additional insulin adjustment is needed at that time.

## 2020-08-29 ENCOUNTER — Telehealth: Payer: Self-pay | Admitting: Family Medicine

## 2020-08-29 NOTE — Telephone Encounter (Signed)
Pt walked in to return Leave of Absence Administration paperwork. Pt stated page 4 need to be signed and question # 11 needs to be filled out.  Form placed in Dr. Thomes Lolling  box.   Please fax and return the original to patient.

## 2020-09-01 MED ORDER — NOVOLOG FLEXPEN 100 UNIT/ML ~~LOC~~ SOPN: 4.0000 [IU] | PEN_INJECTOR | Freq: Three times a day (TID) | SUBCUTANEOUS | 5 refills | Status: DC

## 2020-09-01 MED ORDER — INSULIN GLARGINE 100 UNIT/ML SOLOSTAR PEN: 30.0000 [IU] | PEN_INJECTOR | SUBCUTANEOUS | Status: DC

## 2020-09-01 NOTE — Telephone Encounter (Signed)
Patient contact in follow-up of blood sugar control.   Patient reports taking insulin glargine 38 units each morning.   Reports fasting blood glucose readings of 95 and also had any early morning 66 which recovered to 119 after treatment.  She also report non-fasting blood sugars of 248, 313, 281 and 298.  She is taking metformin and is continuing to hold empagliflozin.    Discussed diet and meal intake.  Patient reports drinking only water.  Eats 3 meals and avoids sweets, juices and sodas.   Following our discussion we agreed to inititate rapid acting meal-time insulin with all three meals.  We discussed that her blood sugars are concerning for her high non-fasting readings.   Lantus Reduced to 30 units each morning.  Initiated Novolog 4 units prior to each meal.  We discussed plan to follow-up in 3 days (Thursday) and consider prandial insulin dose adjustment, possibly increase largest meal dosing to 6 or 8 units prior to evening meal at that time.   New Novolog prescription sent to pharmacy.

## 2020-09-01 NOTE — Addendum Note (Signed)
Addended by: Kathrin Ruddy on: 09/01/2020 11:10 AM   Modules accepted: Orders

## 2020-09-01 NOTE — Assessment & Plan Note (Signed)
Patient contact in follow-up of blood sugar control.   Patient reports taking insulin glargine 38 units each morning.   Reports fasting blood glucose readings of 95 and also had any early morning 66 which recovered to 119 after treatment.  She also report non-fasting blood sugars of 248, 313, 281 and 298.  She is taking metformin and is continuing to hold empagliflozin.    Discussed diet and meal intake.  Patient reports drinking only water.  Eats 3 meals and avoids sweets, juices and sodas.   Following our discussion we agreed to inititate rapid acting meal-time insulin with all three meals.  We discussed that her blood sugars are concerning for her high non-fasting readings.   Lantus Reduced to 30 units each morning.  Initiated Novolog 4 units prior to each meal.  We discussed plan to follow-up in 3 days (Thursday) and consider prandial insulin dose adjustment, possibly increase largest meal dosing to 6 or 8 units prior to evening meal at that time.   New Novolog prescription sent to pharmacy.  

## 2020-09-01 NOTE — Telephone Encounter (Signed)
Noted and agree. 

## 2020-09-03 ENCOUNTER — Telehealth: Payer: Self-pay | Admitting: Family Medicine

## 2020-09-03 NOTE — Telephone Encounter (Signed)
Spoke with patient regarding FMLA forms.  I completed forms and have faxed them to [employer].  Please call patient to let her know when complete. Forms placed in front office for patient pick up.  A copy of forms were placed in my mailbox.  Thank you  Dana Allan, MD Family Medicine Residency

## 2020-09-03 NOTE — Telephone Encounter (Signed)
Patient is calling to check on the status of having form completed. She needs this form by today at the latest. The form is in Dr. Rachael Darby box who is covering for Dr. Allena Katz.   Patient would like to have #11 filled out and for someone to call her when it is ready to be picked up at the front desk.   Please advise.

## 2020-09-04 ENCOUNTER — Telehealth: Payer: Self-pay | Admitting: Pharmacist

## 2020-09-04 DIAGNOSIS — E119 Type 2 diabetes mellitus without complications: Secondary | ICD-10-CM

## 2020-09-04 MED ORDER — NOVOLOG FLEXPEN 100 UNIT/ML ~~LOC~~ SOPN: PEN_INJECTOR | SUBCUTANEOUS | 5 refills | Status: DC

## 2020-09-04 MED ORDER — INSULIN GLARGINE 100 UNIT/ML SOLOSTAR PEN: 28.0000 [IU] | PEN_INJECTOR | SUBCUTANEOUS | Status: DC

## 2020-09-04 NOTE — Telephone Encounter (Signed)
Message was routed to PCP and Dr. Rachael Darby. Teresa Chang, CMA

## 2020-09-04 NOTE — Telephone Encounter (Signed)
Patient contacted for follow-up of blood sugar control.  Patient reports taking insulin glargine (Lantus) 30 units daily in the mornings and insulin aspart (Novolog) 4 units with supper. Reports confusion regarding rapid acting directions - has been taking 4 units with dinner instead of three times daily as prescribed.  Reports fasting blood glucose: 128, 149, 95, with 95 this AM.  Reports mid-day blood glucose: 140, 248, 268 Reports evening blood glucose: 153, 229, 265, 219, 229, 174  Denies BG<70 since reducing long-acting insulin from 38 to 30 units daily.  Reports medication adherence with metformin and holding empagliflozin.  Reports largest meal of the day is supper and sometimes skips or eats a small breakfast and lunch.  Following our discussion, we agreed to the plan below. 1. Decrease insulin glargine (Lantus) from 30 units to 28 units daily 2. Start taking insulin aspart (Novolog) 2-4 units with breakfast and lunch, and 6 units with dinner.  Will follow-up with patient 5/31 via telephone call.  Telephone call conducted by: Fabio Neighbors, PharmD, BCPS PGY2 Ambulatory Care Pharmacy Resident [facility]

## 2020-09-05 ENCOUNTER — Ambulatory Visit (INDEPENDENT_AMBULATORY_CARE_PROVIDER_SITE_OTHER): Payer: Medicaid Other | Admitting: Family Medicine

## 2020-09-05 ENCOUNTER — Other Ambulatory Visit: Payer: Self-pay

## 2020-09-05 ENCOUNTER — Telehealth: Payer: Self-pay

## 2020-09-05 ENCOUNTER — Encounter: Payer: Self-pay | Admitting: Family Medicine

## 2020-09-05 VITALS — BP 126/84 | HR 76 | Wt 201.8 lb

## 2020-09-05 DIAGNOSIS — M545 Low back pain, unspecified: Secondary | ICD-10-CM | POA: Diagnosis present

## 2020-09-05 DIAGNOSIS — M79605 Pain in left leg: Secondary | ICD-10-CM

## 2020-09-05 DIAGNOSIS — R300 Dysuria: Secondary | ICD-10-CM

## 2020-09-05 LAB — POCT URINALYSIS DIP (MANUAL ENTRY)
Bilirubin, UA: NEGATIVE
Blood, UA: NEGATIVE
Glucose, UA: NEGATIVE mg/dL
Leukocytes, UA: NEGATIVE
Nitrite, UA: NEGATIVE
Spec Grav, UA: >=1.03 — AB (ref 1.010–1.025)
Urobilinogen, UA: 0.2 E.U./dL
pH, UA: 5.5 (ref 5.0–8.0)

## 2020-09-05 MED ORDER — METHOCARBAMOL 500 MG PO TABS: 500.0000 mg | ORAL_TABLET | Freq: Four times a day (QID) | ORAL | 0 refills | Status: DC

## 2020-09-05 MED ORDER — LIDOCAINE 5 % EX PTCH: 1.0000 | MEDICATED_PATCH | CUTANEOUS | 0 refills | Status: DC

## 2020-09-05 MED ORDER — DICLOFENAC SODIUM 1 % EX GEL: 2.0000 g | Freq: Four times a day (QID) | CUTANEOUS | 0 refills | Status: DC

## 2020-09-05 NOTE — Patient Instructions (Signed)
For your back pain As we discussed, in multiple things.  For the pain, I am sending Robaxin to the pharmacy as well as lidocaine patch and Voltaren gel.  You can also use Tylenol in addition to these. We also would like to get some imaging.  Please go to [facility] imaging at [address] to get a lumbar x-ray.  We will call you on Tuesday with an appointment for your kidney ultrasound.  We will also follow-up with your urine culture that we are getting today.

## 2020-09-05 NOTE — Telephone Encounter (Signed)
Patient calls nurse line regarding continues UTI symptoms. Patient reports continued lower left back pain and pressure with urination. No abdominal pain.   Per previous discussion with Dr. Mauri Reading, patient was advised to call back to office for possible penicillin abx if symptoms did not improve.   Patient is also requesting an extension on her work excuse to last until 6/7, as her symptoms have not improved. Dr. Leary Roca wrote previous note until 5/31.  Please advise.   Veronda Prude, RN

## 2020-09-05 NOTE — Progress Notes (Addendum)
SUBJECTIVE:   CHIEF COMPLAINT / HPI:   Teresa Chang (MRN: [id number]) is a 50 y.o. female with a history of CHF, HTN, T2DM, bipolar disorder, depression, and anxiety who presents for evaluation of ongoing left lower back pain.  Left lower back pain Patient states her pain started acutely yesterday. She was not doing anything when the pain started yesterday, just sitting and watching TV. She describes the pain as "deep" into her back, and she endorses shooting pain from the left lower back down to the inferior patella. She was treated with antibiotics for a UTI two weeks ago and reports she finished her complete course of Keflex. The pain is "20/10" in intensity today. She has not tried anything to help the pain and nothing has made it worse. She feels that this pain is due to her UTI not being treated with her prior antibiotics, though she denies any further dysuria after tx with antibiotics, but she does have urinary frequency and "pressure". She also notes that her furosemide for her heart failure was contributing to her having to urinate more often, so she stopped the medication.   PERTINENT  PMH / PSH: diastolic HF, UTI, DM, anxiety, MDD, Bipolar  OBJECTIVE:   BP 126/84   Pulse 76   Wt 201 lb 12.8 oz (91.5 kg)   LMP 08/23/2020 (Exact Date)   SpO2 99%   BMI 31.61 kg/m    PHYSICAL EXAM  GEN: well developed, alert, conversant, wincing in pain and clutching her left lower back in pain in the chair, able to ambulate, easily sits on exam table, and undergo physical exam without difficulty CVS: RRR, normal S1/S2, no murmurs, rubs, gallops RESP: Breathing comfortably on RA, no retractions, wheezes, rhonchi, or crackles MSK: No pain to palpation of left lower back. No costovertebral tenderness. FROM lumbar, hips, and lower extremities. 5/5 strength in lower extremities. Symmetric sensation. Negative straight-leg raise. No pitting BLEE  ASSESSMENT/PLAN:   Low back pain radiating to  left lower extremity Patient has been experiencing left lower back pain that has been worsening since having UTI two weeks ago. She finished all antibiotics and her dysuria has improved though does have frequency and pressure with urination. Repeat UA today reveals no infection. Confirmatory culture being sent out. She does have some radiation of pain to inferior patella. Physical exam shows patient in significant pain, no TTP of midline spine, paraspinal area, or at site of greatest pain identified by patient. Full ROM of hips, low back, lower extremities, negative straight leg raise, no CVA tenderness.   Differential: nephrolithiasis vs. muscle strain. Pyelonephritis considered, however, UTI negative today and lack of fever, nausea, and vomiting makes this diagnosis less likely. Kidney stone could present with this lower back pain with some radiation down to leg, but her lack of radiation to the groin and hematuria on UA makes this less likely. Muscle strain could also present with this pain, but no history of recent trauma or increase in activity would not explain this diagnosis. Lumbar radiculopathy may also be suspected based on lower back pain with radiation to the inferior patella, but the lack of positive leg raise on exam and no deficits in ROM, strength, and sensation make this less likely.   Additionally, patient is requesting work letter be written She is currently on short term disability for UTI symptoms from earlier this month as well as poorly controlled diabetes, per patient. She would like to extend this in the setting of her low  back pain. I believe this would require another form to be filled out given new reason for STD, though, I do not think this is appropriate at this time. Letter for work written as requested by patient for 09/09/20 for follow appointment at [facility]. Unfortunately, no appts available as of now, informed patient to call on 5/31 AM to let us know how she is doing and to see if  any appts opened up. Appt schedule for 6/1, but she can return to work if she has improving pain. We will call her on Tuesday for Renal US scheduling.   -Renal ultrasound  -Lumbar x-ray to assess for any degenerative changes -Follow up urine culture -Robaxin 500 mg Q6H to relieve potential MSK pain -Lidocaine 5% patch over area daily for pain -Voltaren 1% gel Q6H for pain -BMP to assess electrolytes, renal function     Samantha Crimes, Medical Student [facility]  FPTS Upper-Level Resident Addendum I have discussed the above with the original author and agree with their documentation. My edits for correction/addition/clarification are included. Please see also any attending notes.   Melene Plan, M.D.  PGY-3 09/06/2020 1:48 PM

## 2020-09-05 NOTE — Telephone Encounter (Signed)
Spoke to Hodge about this in person.  Patient needs to come in for follow-up appointment if she continues to have urinary symptoms especially this many weeks out from antibiotics.  She can be scheduled in my clinic this afternoon if agreeable.

## 2020-09-06 ENCOUNTER — Encounter: Payer: Self-pay | Admitting: Family Medicine

## 2020-09-06 DIAGNOSIS — M79605 Pain in left leg: Secondary | ICD-10-CM | POA: Insufficient documentation

## 2020-09-06 DIAGNOSIS — M545 Low back pain, unspecified: Secondary | ICD-10-CM | POA: Insufficient documentation

## 2020-09-06 LAB — BASIC METABOLIC PANEL
BUN/Creatinine Ratio: 11 (ref 9–23)
BUN: 8 mg/dL (ref 6–24)
CO2: 23 mmol/L (ref 20–29)
Calcium: 9.3 mg/dL (ref 8.7–10.2)
Chloride: 102 mmol/L (ref 96–106)
Creatinine, Ser: 0.76 mg/dL (ref 0.57–1.00)
Glucose: 121 mg/dL — ABNORMAL HIGH (ref 65–99)
Potassium: 4.3 mmol/L (ref 3.5–5.2)
Sodium: 138 mmol/L (ref 134–144)
eGFR: 95 mL/min/{1.73_m2} (ref >59)

## 2020-09-06 NOTE — Assessment & Plan Note (Signed)
Patient has been experiencing left lower back pain that has been worsening since having UTI two weeks ago. She finished all antibiotics and her dysuria has improved though does have frequency and pressure with urination. Repeat UA today reveals no infection. Confirmatory culture being sent out. She does have some radiation of pain to inferior patella. Physical exam shows patient in significant pain, no TTP of midline spine, paraspinal area, or at site of greatest pain identified by patient. Full ROM of hips, low back, lower extremities, negative straight leg raise, no CVA tenderness.   Differential: nephrolithiasis vs. muscle strain. Pyelonephritis considered, however, UTI negative today and lack of fever, nausea, and vomiting makes this diagnosis less likely. Kidney stone could present with this lower back pain with some radiation down to leg, but her lack of radiation to the groin and hematuria on UA makes this less likely. Muscle strain could also present with this pain, but no history of recent trauma or increase in activity would not explain this diagnosis. Lumbar radiculopathy may also be suspected based on lower back pain with radiation to the inferior patella, but the lack of positive leg raise on exam and no deficits in ROM, strength, and sensation make this less likely.   Additionally, patient is requesting work letter be written She is currently on short term disability for UTI symptoms from earlier this month as well as poorly controlled diabetes, per patient. She would like to extend this in the setting of her low back pain. I believe this would require another form to be filled out given new reason for STD, though, I do not think this is appropriate at this time. Letter for work written as requested by patient for 09/09/20 for follow appointment at [facility]. Unfortunately, no appts available as of now, informed patient to call on 5/31 AM to let us know how she is doing and to see if any appts opened up.  Appt schedule for 6/1, but she can return to work if she has improving pain. We will call her on Tuesday for Renal US scheduling.   -Renal ultrasound  -Lumbar x-ray to assess for any degenerative changes -Follow up urine culture -Robaxin 500 mg Q6H to relieve potential MSK pain -Lidocaine 5% patch over area daily for pain -Voltaren 1% gel Q6H for pain -BMP to assess electrolytes, renal function

## 2020-09-07 LAB — URINE CULTURE

## 2020-09-09 ENCOUNTER — Telehealth: Payer: Self-pay | Admitting: Family Medicine

## 2020-09-09 ENCOUNTER — Telehealth: Payer: Self-pay

## 2020-09-09 ENCOUNTER — Telehealth: Payer: Self-pay | Admitting: Pharmacist

## 2020-09-09 DIAGNOSIS — E119 Type 2 diabetes mellitus without complications: Secondary | ICD-10-CM

## 2020-09-09 MED ORDER — NOVOLOG FLEXPEN 100 UNIT/ML ~~LOC~~ SOPN: PEN_INJECTOR | SUBCUTANEOUS | 5 refills | Status: DC

## 2020-09-09 NOTE — Telephone Encounter (Signed)
Noted and agree. 

## 2020-09-09 NOTE — Telephone Encounter (Signed)
Called patient with results. Discussed urine culture and BMP. Discussed at length how to obtain X-rays. All questions answered.   Nursing- please schedule renal ultrasound for patient.   Dr. Nobie Putnam- please make sure patient knows where [place] imaging is located and has renal ultrasound scheduled.   Terisa Starr, MD  Family Medicine Teaching Service

## 2020-09-09 NOTE — Telephone Encounter (Signed)
Patient is needing refill on Furosemide as soon as possible due to her retaining so much fluid she states she can not fit her clothes, and she is needing it.. Patient also states she is wanting Dr. Allena Katz to call her as soon as she can. Please advise. Thanks!

## 2020-09-09 NOTE — Telephone Encounter (Signed)
Patient reports taking 30 units of basal insulin each morning and 5 units of short-acting insulin before each meal, three times daily.  Her morning, fasting blood sugars run around 100, while her post-prandial blood sugars are 170-230.  Patient instructed to start taking: - Basal insulin glargine (Lantus) 28 units every morning - Fast-acting insulin aspart (Novolog) 6 units before each meal, three times a day  Plan to follow up on Thursday via phone

## 2020-09-09 NOTE — Telephone Encounter (Signed)
Patient calls nurse line very upset in regards to her care. Patient reports no one has called her in regards to results or with a plan. Patient stated she is still in "extreme" pain and "pissed off." I advised her an order was put in for a back xray and she could walk in at [place] [facility] to have this done at any time. Patient became very agitated at this, as no one told her she could just walk in. I advised patient there is also an order for a renal US and I will call to have this scheduled for her. Patient does have an apt tomorrow at [facility] and I encouraged her to have xray done today before apt. Patient demanded she speak to someone other than the doctor she saw last week. Patient does not want to speak to a resident. Will forward to preceptor who signed initial visit.

## 2020-09-09 NOTE — Assessment & Plan Note (Signed)
Patient reports taking 30 units of basal insulin each morning and 5 units of short-acting insulin before each meal, three times daily.  Her morning, fasting blood sugars run around 100, while her post-prandial blood sugars are 170-230.  Patient instructed to start taking: - Basal insulin glargine (Lantus) 28 units every morning - Fast-acting insulin aspart (Novolog) 6 units before each meal, three times a day

## 2020-09-09 NOTE — Telephone Encounter (Signed)
-----   Message from Kathrin Ruddy, RPH-CPP sent at 09/04/2020  1:00 PM EDT ----- Regarding: Diabetes F/U

## 2020-09-09 NOTE — Telephone Encounter (Signed)
Noted and agreed. Thank you Dr Raymondo Band. Appreciate it.

## 2020-09-10 ENCOUNTER — Other Ambulatory Visit: Payer: Self-pay

## 2020-09-10 ENCOUNTER — Ambulatory Visit (INDEPENDENT_AMBULATORY_CARE_PROVIDER_SITE_OTHER): Payer: Medicaid Other | Admitting: Family Medicine

## 2020-09-10 VITALS — BP 124/78 | HR 78 | Wt 203.8 lb

## 2020-09-10 DIAGNOSIS — M7989 Other specified soft tissue disorders: Secondary | ICD-10-CM | POA: Diagnosis not present

## 2020-09-10 DIAGNOSIS — M545 Low back pain, unspecified: Secondary | ICD-10-CM

## 2020-09-10 DIAGNOSIS — M79605 Pain in left leg: Secondary | ICD-10-CM | POA: Diagnosis not present

## 2020-09-10 DIAGNOSIS — E119 Type 2 diabetes mellitus without complications: Secondary | ICD-10-CM | POA: Diagnosis not present

## 2020-09-10 DIAGNOSIS — I10 Essential (primary) hypertension: Secondary | ICD-10-CM | POA: Diagnosis not present

## 2020-09-10 NOTE — Progress Notes (Signed)
SUBJECTIVE:   CHIEF COMPLAINT / HPI:   Back pain Patient reports that since being seen at her last visit she has been taking her medication as prescribed with great improvement in her symptoms.  She is still having low back pain on the left side.  She says that the Tylenol and muscle relaxer really helped.  She feels like her UTI symptoms are resolving.  She has yet to get ultrasound of her kidney.  She is also not gotten the x-ray because she would not get in 1 trip.  Swelling Patient reports that for the past week or 2 she has noticed increased fluid retention.  She reports that she thinks she has heart failure and she was on Lasix for that but that she was told that Lasix have been discontinued.  Through chart review it appears she was on Lasix in the past but I cannot find any documented heart failure.  She does have an echo which shows an EF of 50-55% which does not meet criteria for HFmrEF or HFrEF.  She reports that she has not been peeing as much as she usually does.  Of note she was previously on Jardiance but that was held due to UTI.  Her hemoglobin A1c at last check was 12.4.  OBJECTIVE:   BP 124/78   Pulse 78   Wt 203 lb 12.8 oz (92.4 kg)   LMP 08/23/2020 (Exact Date)   SpO2 96%   BMI 31.92 kg/m   General: Well-appearing 50 year old female in no acute distress Cardiac: Regular rate and rhythm, no murmurs appreciated Respiratory: Normal work of breathing, lungs clear to auscultation bilaterally, no crackles noted in lung bases MSK: No gross abnormalities.  No lower extremity pitting edema noted.  ASSESSMENT/PLAN:   Swelling of lower extremity Patient reports that over the last week she has had swelling of her lower extremities as well as she feels like her whole body.  She reports that she was on Lasix in the past and would like a refill on the Lasix.  She reports that Lasix was started because of her heart failure.  From chart review she had an echo which showed grade 2  diastolic dysfunction and repeat showed improved diastolic dysfunction with an LVEF of 50-55%.  She was placed to follow-up with cardiology regarding further medication management but has not been able to do that.  Physical exam today was reassuring and I noted no pitting edema in her lower extremities.  She had no crackles in her chest.  I feel like the swelling is multifactorial.  She was on a diuretic in the past but was also on Jardiance.  She has been holding the Jardiance since having a UTI and she has noticed decreased urination.  She is going to get an ultrasound of her kidneys and follow-up with her PCP as soon as that occurs.  I recommend considering restarting Jardiance and her symptoms may resolve.  1 other consideration and recommendation provided to the patient was that she needs to follow-up with cardiology.  She has not seen them in a considerable amount of time.  She is going to call heart care and schedule an appointment.  Low back pain radiating to left lower extremity Patient reports continued issues with low back pain radiating down to her left lower extremity but reports that it is improved.  Mild costovertebral angle tenderness today on physical exam.  She reports that the medications prescribed at the previous visit are helping with the pain  but it is not completely gone away.  She has not gotten the ultrasound or the x-ray of her back.  We have scheduled her for an ultrasound and she will go and get that x-ray of her back.  She is going to follow-up with her PCP after obtaining this imaging.  Strict ED return precautions given.     Derrel Nip, MD [facility]

## 2020-09-10 NOTE — Telephone Encounter (Signed)
Pt in office today, [place] scheduled on 09/18/2020 at [facility] and map to [facility] provided to patient for xray. Shakayla Hickox Zimmerman Rumple, CMA

## 2020-09-10 NOTE — Assessment & Plan Note (Signed)
Patient reports that over the last week she has had swelling of her lower extremities as well as she feels like her whole body.  She reports that she was on Lasix in the past and would like a refill on the Lasix.  She reports that Lasix was started because of her heart failure.  From chart review she had an echo which showed grade 2 diastolic dysfunction and repeat showed improved diastolic dysfunction with an LVEF of 50-55%.  She was placed to follow-up with cardiology regarding further medication management but has not been able to do that.  Physical exam today was reassuring and I noted no pitting edema in her lower extremities.  She had no crackles in her chest.  I feel like the swelling is multifactorial.  She was on a diuretic in the past but was also on Jardiance.  She has been holding the Jardiance since having a UTI and she has noticed decreased urination.  She is going to get an ultrasound of her kidneys and follow-up with her PCP as soon as that occurs.  I recommend considering restarting Jardiance and her symptoms may resolve.  1 other consideration and recommendation provided to the patient was that she needs to follow-up with cardiology.  She has not seen them in a considerable amount of time.  She is going to call heart care and schedule an appointment.

## 2020-09-10 NOTE — Assessment & Plan Note (Signed)
Patient reports continued issues with low back pain radiating down to her left lower extremity but reports that it is improved.  Mild costovertebral angle tenderness today on physical exam.  She reports that the medications prescribed at the previous visit are helping with the pain but it is not completely gone away.  She has not gotten the ultrasound or the x-ray of her back.  We have scheduled her for an ultrasound and she will go and get that x-ray of her back.  She is going to follow-up with her PCP after obtaining this imaging.  Strict ED return precautions given.

## 2020-09-10 NOTE — Patient Instructions (Signed)
It was wonderful to see you today!  I am glad your back is feeling a little bit better.  We are going to check your thyroid function as well as your liver and kidney function again.  Please be sure to go to the ultrasound that has been scheduled for you.  Please also go and get a x-ray of your back.  Please schedule a follow-up appointment after you have had your kidney ultrasound with your primary care provider.  I hope you have a wonderful afternoon!

## 2020-09-11 ENCOUNTER — Telehealth: Payer: Self-pay

## 2020-09-11 ENCOUNTER — Telehealth: Payer: Self-pay | Admitting: Pharmacist

## 2020-09-11 DIAGNOSIS — E119 Type 2 diabetes mellitus without complications: Secondary | ICD-10-CM

## 2020-09-11 LAB — COMPREHENSIVE METABOLIC PANEL
ALT: 10 IU/L (ref 0–32)
AST: 14 IU/L (ref 0–40)
Albumin/Globulin Ratio: 1.7 (ref 1.2–2.2)
Albumin: 3.8 g/dL (ref 3.8–4.9)
Alkaline Phosphatase: 67 IU/L (ref 44–121)
BUN/Creatinine Ratio: 8 — ABNORMAL LOW (ref 9–23)
BUN: 6 mg/dL (ref 6–24)
Bilirubin Total: 0.2 mg/dL (ref 0.0–1.2)
CO2: 21 mmol/L (ref 20–29)
Calcium: 9.2 mg/dL (ref 8.7–10.2)
Chloride: 107 mmol/L — ABNORMAL HIGH (ref 96–106)
Creatinine, Ser: 0.77 mg/dL (ref 0.57–1.00)
Globulin, Total: 2.3 g/dL (ref 1.5–4.5)
Glucose: 117 mg/dL — ABNORMAL HIGH (ref 65–99)
Potassium: 4.5 mmol/L (ref 3.5–5.2)
Sodium: 141 mmol/L (ref 134–144)
Total Protein: 6.1 g/dL (ref 6.0–8.5)
eGFR: 93 mL/min/{1.73_m2} (ref >59)

## 2020-09-11 LAB — TSH: TSH: 0.994 u[IU]/mL (ref 0.450–4.500)

## 2020-09-11 MED ORDER — NOVOLOG FLEXPEN 100 UNIT/ML ~~LOC~~ SOPN: PEN_INJECTOR | SUBCUTANEOUS | 5 refills | Status: DC

## 2020-09-11 MED ORDER — ACCU-CHEK SOFTCLIX LANCETS MISC: 12 refills | Status: DC

## 2020-09-11 MED ORDER — ACCU-CHEK GUIDE VI STRP: ORAL_STRIP | 12 refills | Status: DC

## 2020-09-11 NOTE — Telephone Encounter (Addendum)
Contacted patient regarding diabetes management. Patient reports taking 28 units of basal insulin each morning and 6 units of short-acting insulin before each meal, three times daily.  Patient was at work and did not have glucometer present, but reports post-prandial blood sugars have improved, now 160s-170s.   Reports one episode of hypoglycemia (60s) a couple hours after eating lunch. Patient could not recall size of lunch, however usually eats a small breakfast/lunch.  Plan: - Reduced fast-acting insulin aspart (Novolog) to 4 units with breakfast and lunch and 6 units with dinner before each meal - Continued basal insulin glargine (Lantus) 28 units every morning -Resent glucometer test strips and lancets for three times daily testing  Follow-up phone call in 1 week.  Telephone encounter conducted by: Fabio Neighbors, PharmD, BCPS PGY2 Ambulatory Care Pharmacy Resident

## 2020-09-11 NOTE — Telephone Encounter (Signed)
Drafted letter per Dr. Nobie Putnam. New letter was created, due to being unable to edit, as I was not the original creator of letter.   Veronda Prude, RN

## 2020-09-11 NOTE — Telephone Encounter (Signed)
Patient calls nurse line requesting revised work letter from office visit on 09/10/2020. Patient was unable to return to work yesterday and needs letter to state that she can return to work on 09/11/20 with no restrictions. Patient also is requesting that letter indicated that appointment on 6/1 was a follow up visit.   Please advise if letter can be revised with the above information. Patient requests that letter be sent via mychart if possible.   Veronda Prude, RN

## 2020-09-11 NOTE — Telephone Encounter (Signed)
Duplicate

## 2020-09-15 ENCOUNTER — Other Ambulatory Visit: Payer: Self-pay | Admitting: Family Medicine

## 2020-09-15 ENCOUNTER — Telehealth: Payer: Self-pay | Admitting: Family Medicine

## 2020-09-15 DIAGNOSIS — I5032 Chronic diastolic (congestive) heart failure: Secondary | ICD-10-CM

## 2020-09-15 MED ORDER — FUROSEMIDE 20 MG PO TABS: ORAL_TABLET | ORAL | 3 refills | Status: DC

## 2020-09-15 NOTE — Telephone Encounter (Signed)
Called pt and refilled her Lasix. Thank you.

## 2020-09-15 NOTE — Telephone Encounter (Signed)
Pt reports she is retaining fluid all over her body and has gained weight. Denies SOB or chest pain. She would like a refill for Lasix. Sent in refill for Lasix 20mg . Follow up next week with myself in [facility] on 14th at 10.50am. Strict ER precautions given to pt who expressed understanding.

## 2020-09-18 ENCOUNTER — Other Ambulatory Visit: Payer: Self-pay

## 2020-09-18 ENCOUNTER — Ambulatory Visit (HOSPITAL_COMMUNITY)
Admission: RE | Admit: 2020-09-18 | Discharge: 2020-09-18 | Disposition: A | Discharge status: Home / Self Care | Payer: Medicaid Other | Source: Ambulatory Visit | Attending: Family Medicine | Admitting: Family Medicine

## 2020-09-18 DIAGNOSIS — M79605 Pain in left leg: Secondary | ICD-10-CM | POA: Diagnosis present

## 2020-09-18 DIAGNOSIS — M545 Low back pain, unspecified: Secondary | ICD-10-CM | POA: Insufficient documentation

## 2020-09-18 DIAGNOSIS — R109 Unspecified abdominal pain: Secondary | ICD-10-CM | POA: Diagnosis not present

## 2020-09-22 NOTE — Progress Notes (Signed)
     SUBJECTIVE:   CHIEF COMPLAINT / HPI:   Teresa Chang is a 50 y.o. female presents for leg edema  Leg edema Her sx started a few weeks ago and she believes she gained a lot of fluid weight. Her peripheral swelling is worse at the end of the day and when she stands for long periods of time. Usually takes Lasix 20mg  every other day which she has been out of for the last 1 month ago. Usually has a good diuretic repsonse from 20mg .  Denies chest pain, palpitations, dizziness, cough, orthopnea, PND, dyspnea or fevers. Seen in clinic 2 weeks for similar sx. At this time was restarted.  She usually sees cardiologist Dr .    Flowsheet Row Office Visit from 09/23/2020 in [facility]  PHQ-9 Total Score 11        PERTINENT  PMH / PSH: DM, HTN, HLD, OA   OBJECTIVE:   BP 110/74   Pulse 71   Wt 203 lb (92.1 kg)   LMP 09/23/2020   SpO2 99%   BMI 31.79 kg/m    General: Alert, no acute distress Cardio: Normal S1 and S2, RRR, no r/m/g Pulm: CTAB, normal work of breathing Abdomen: Bowel sounds normal. Abdomen soft and non-tender.  Extremities: Mild 1+ peripheral edema.  Neuro: Cranial nerves grossly intact   ASSESSMENT/PLAN:   Swelling of lower extremity Peripheral is likely 2/2 venous insufficiency with modest CHF component. Does not appear grossly fluid overloaded on exam today. Will refill Lasix 20mg  which she can take daily. Recommended compression stockings and elevating legs. Also obtained BNP today to evaluated CHF component. Recommend follow up with Cardiology as last follow up was a few years ago.      Borgarnes, MD PGY-2 [facility]

## 2020-09-23 ENCOUNTER — Other Ambulatory Visit: Payer: Self-pay

## 2020-09-23 ENCOUNTER — Ambulatory Visit (INDEPENDENT_AMBULATORY_CARE_PROVIDER_SITE_OTHER): Payer: Medicaid Other | Admitting: Family Medicine

## 2020-09-23 ENCOUNTER — Telehealth: Payer: Self-pay

## 2020-09-23 ENCOUNTER — Other Ambulatory Visit: Payer: Self-pay | Admitting: Family Medicine

## 2020-09-23 DIAGNOSIS — I5032 Chronic diastolic (congestive) heart failure: Secondary | ICD-10-CM

## 2020-09-23 DIAGNOSIS — M7989 Other specified soft tissue disorders: Secondary | ICD-10-CM | POA: Diagnosis not present

## 2020-09-23 MED ORDER — FUROSEMIDE 20 MG PO TABS: 20.0000 mg | ORAL_TABLET | Freq: Every day | ORAL | 0 refills | Status: DC

## 2020-09-23 NOTE — Patient Instructions (Signed)
Thank you for coming to see me today. It was a pleasure. Today we discussed your swelling in the legs. It could be related to the heart. I recommend lasix 20 daily, compression stockings at night and follow up with cardiology ASAP.  We will get some labs today.  If they are abnormal or we need to do something about them, I will call you.  If they are normal, I will send you a message on MyChart (if it is active) or a letter in the mail.  If you don't hear from [place] in 2 weeks, please call the office at the number below.   Please follow-up with me in the next 2-4 weeks.   If you have any questions or concerns, please do not hesitate to call the office at [phone].  Best wishes,   Dr Allena Katz

## 2020-09-23 NOTE — Telephone Encounter (Signed)
Pharmacy calls nurse line wanting clarification on furosemide directions. PCP reports daily dose verse every other day that was sent in. New prescription has been sent in to reflect one time daily.

## 2020-09-24 LAB — BRAIN NATRIURETIC PEPTIDE: BNP: 24.1 pg/mL (ref 0.0–100.0)

## 2020-09-27 NOTE — Assessment & Plan Note (Addendum)
Peripheral is likely 2/2 venous insufficiency with modest CHF component. Does not appear grossly fluid overloaded on exam today. Will refill Lasix 20mg  which she can take daily. Recommended compression stockings and elevating legs. Also obtained BNP today to evaluated CHF component. Recommend follow up with Cardiology as last follow up was a few years ago.

## 2020-10-02 ENCOUNTER — Telehealth: Payer: Self-pay | Admitting: Pharmacist

## 2020-10-02 NOTE — Telephone Encounter (Signed)
Attempted phone contact to discuss blood glucose control.   No answer, left message requesting call back - direct line phone number provided.   I will plan to call her again in 1 week if I do not hear from her.

## 2020-10-02 NOTE — Telephone Encounter (Signed)
-----   Message from Kathrin Ruddy, RPH-CPP sent at 09/11/2020  2:32 PM EDT ----- Regarding: DM f/u

## 2020-10-06 ENCOUNTER — Telehealth: Payer: Self-pay | Admitting: Pharmacist

## 2020-10-06 DIAGNOSIS — E119 Type 2 diabetes mellitus without complications: Secondary | ICD-10-CM

## 2020-10-06 MED ORDER — NOVOLOG FLEXPEN 100 UNIT/ML ~~LOC~~ SOPN: 4.0000 [IU] | PEN_INJECTOR | Freq: Three times a day (TID) | SUBCUTANEOUS | 5 refills | Status: DC

## 2020-10-06 MED ORDER — INSULIN GLARGINE 100 UNIT/ML SOLOSTAR PEN: 18.0000 [IU] | PEN_INJECTOR | SUBCUTANEOUS | Status: DC

## 2020-10-06 NOTE — Telephone Encounter (Signed)
Phone call returned from patient after several failed attempts to contact patient over the last several weeks.  She left a voice mail, sharing details of her improved control and dosing. Currently Lantus 20 units daily and Novolog 4 units prior to breakfast and lunch.  She denies using mealtime insulin in the evening.   When I returned call she described her breakfast and lunch as substantial and her dinner as "light". Breakfast often includes egg, bacon and toast. Lunch often includes 2 piece chicken meal from [employer] (near her work). Dinner is skipped or something small like a sandwich.   Patient and I discussed insulin adjustments and decided to  Change Lantus (insulin glargine) from 20 to 18 units once daily in the AM And  Change Novolog (insulin aspart) from 4 to 6 units prior to her breakfast and lunch - maintain consideration for a dose of 4 units with her evening meal if it consisted of something larger than a sandwich.   Plan to follow-up in 2 weeks by phone on her Monday day off.

## 2020-10-07 NOTE — Telephone Encounter (Signed)
Noted and agree. 

## 2020-10-07 NOTE — Telephone Encounter (Signed)
Noted and agreed. Thank you Dr Koval. 

## 2020-10-12 ENCOUNTER — Other Ambulatory Visit: Payer: Self-pay | Admitting: Pharmacist

## 2020-10-12 DIAGNOSIS — E119 Type 2 diabetes mellitus without complications: Secondary | ICD-10-CM

## 2020-10-20 ENCOUNTER — Telehealth: Payer: Self-pay | Admitting: Pharmacist

## 2020-10-20 DIAGNOSIS — E119 Type 2 diabetes mellitus without complications: Secondary | ICD-10-CM

## 2020-10-20 MED ORDER — NOVOLOG FLEXPEN 100 UNIT/ML ~~LOC~~ SOPN: 5.0000 [IU] | PEN_INJECTOR | Freq: Three times a day (TID) | SUBCUTANEOUS | Status: DC

## 2020-10-20 NOTE — Telephone Encounter (Signed)
Patient contacted for follow/up of diabetes and insulin dose assessment.   Patient reports taking  Lantus 18 units once daily Novolog 4 units prior to meals.   Lowest CBG reported was ~ 155 Most readings remain in the high 100-200s range.   Following some discussion we agreed to  Continue Lantus (insulin glargine) 18 units once daily Increase Novolog (insulin aspart) from 4 to 5 units with meals.   We agreed that next follow-up would be with PCP, Dr. Allena Katz.  At that time, I am happy to be part of additional follow-up as needed.  Total time with patient call and documentation of interaction: 13 minutes.  F/U Phone call planned: only if reqested by PCP as patient appears to be near goal CBGs.

## 2020-10-20 NOTE — Telephone Encounter (Signed)
-----   Message from Kathrin Ruddy, RPH-CPP sent at 10/10/2020 10:52 AM EDT ----- Regarding: Diabetes F/U

## 2020-10-20 NOTE — Assessment & Plan Note (Signed)
Following some discussion we agreed to  Continue Lantus (insulin glargine) 18 units once daily Increase Novolog (insulin aspart) from 4 to 5 units with meals.

## 2020-10-30 ENCOUNTER — Encounter (HOSPITAL_COMMUNITY): Payer: Self-pay

## 2020-10-30 ENCOUNTER — Ambulatory Visit (HOSPITAL_COMMUNITY)
Admission: EM | Admit: 2020-10-30 | Discharge: 2020-10-30 | Disposition: A | Discharge status: Home / Self Care | Payer: Medicaid Other | Attending: Internal Medicine | Admitting: Internal Medicine

## 2020-10-30 ENCOUNTER — Other Ambulatory Visit: Payer: Self-pay

## 2020-10-30 DIAGNOSIS — R52 Pain, unspecified: Secondary | ICD-10-CM | POA: Insufficient documentation

## 2020-10-30 DIAGNOSIS — Z20822 Contact with and (suspected) exposure to covid-19: Secondary | ICD-10-CM | POA: Diagnosis not present

## 2020-10-30 DIAGNOSIS — R6889 Other general symptoms and signs: Secondary | ICD-10-CM | POA: Diagnosis not present

## 2020-10-30 LAB — COMPREHENSIVE METABOLIC PANEL
ALT: 12 U/L (ref 0–44)
AST: 18 U/L (ref 15–41)
Albumin: 3.7 g/dL (ref 3.5–5.0)
Alkaline Phosphatase: 67 U/L (ref 38–126)
Anion gap: 11 (ref 5–15)
BUN: 14 mg/dL (ref 6–20)
CO2: 23 mmol/L (ref 22–32)
Calcium: 9.3 mg/dL (ref 8.9–10.3)
Chloride: 103 mmol/L (ref 98–111)
Creatinine, Ser: 1 mg/dL (ref 0.44–1.00)
GFR, Estimated: >60 mL/min (ref >60)
Glucose, Bld: 117 mg/dL — ABNORMAL HIGH (ref 70–99)
Potassium: 3.9 mmol/L (ref 3.5–5.1)
Sodium: 137 mmol/L (ref 135–145)
Total Bilirubin: 0.4 mg/dL (ref 0.3–1.2)
Total Protein: 6.7 g/dL (ref 6.5–8.1)

## 2020-10-30 LAB — CBC
HCT: 39.6 % (ref 36.0–46.0)
Hemoglobin: 12.6 g/dL (ref 12.0–15.0)
MCH: 25.6 pg — ABNORMAL LOW (ref 26.0–34.0)
MCHC: 31.8 g/dL (ref 30.0–36.0)
MCV: 80.3 fL (ref 80.0–100.0)
Platelets: 229 10*3/uL (ref 150–400)
RBC: 4.93 MIL/uL (ref 3.87–5.11)
RDW: 14.2 % (ref 11.5–15.5)
WBC: 9 10*3/uL (ref 4.0–10.5)
nRBC: 0 % (ref 0.0–0.2)

## 2020-10-30 LAB — POC INFLUENZA A AND B ANTIGEN (URGENT CARE ONLY)
INFLUENZA A ANTIGEN, POC: NEGATIVE
INFLUENZA B ANTIGEN, POC: NEGATIVE

## 2020-10-30 LAB — VITAMIN D 25 HYDROXY (VIT D DEFICIENCY, FRACTURES): Vit D, 25-Hydroxy: 39.1 ng/mL (ref 30–100)

## 2020-10-30 LAB — POCT RAPID STREP A, ED / UC: Streptococcus, Group A Screen (Direct): NEGATIVE

## 2020-10-30 NOTE — Discharge Instructions (Addendum)
You likely having a viral upper respiratory infection. We recommended symptom control. I expect your symptoms to start improving in the next 1-2 weeks.   1. Take a daily allergy pill/anti-histamine like Zyrtec, Claritin, or Store brand consistently for 2 weeks  2. For congestion you may try an oral decongestant like Mucinex or sudafed. You may also try intranasal flonase nasal spray or saline irrigations (neti pot, sinus cleanse)  3. For your sore throat you may try cepacol lozenges, salt water gargles, throat spray. Treatment of congestion may also help your sore throat.  4. For cough you may try Robitussen, Mucinex DM  5. Take Tylenol or Ibuprofen to help with pain/inflammation  6. Stay hydrated, drink plenty of fluids to keep throat coated and less irritated  Honey Tea For cough/sore throat try using a honey-based tea. Use 3 teaspoons of honey with juice squeezed from half lemon. Place shaved pieces of ginger into 1/2-1 cup of water and warm over stove top. Then mix the ingredients and repeat every 4 hours as needed.  Your rapid strep test and flu test were negative in urgent care today.  COVID-19 viral swab and throat culture are pending.  We will call if these are positive.  Blood work is also pending.  We will call if any abnormalities are present.  Please follow-up with your primary care physician for further management of vitamin D levels.

## 2020-10-30 NOTE — ED Provider Notes (Signed)
[facility]    CSN: [id number] Arrival date & time: 10/30/20  1747      History   Chief Complaint Chief Complaint  Patient presents with   Generalized Body Aches    HPI Teresa Chang is a 50 y.o. female.   Patient presents with 2-day history of body aches that are present to back and bilateral arms.  Patient states that these same body aches have occurred before when her vitamin D was low.  Patient has been taking vitamin D that was prescribed for approximately 1 month by PCP and prescription ran out 2 weeks ago.  Denies any upper respiratory symptoms, sore throat, cough, fever.  Patient states that there has been an outbreak of COVID-19 at her work.  Denies any injuries to back or arms.  Denies any chest pain or shortness of breath.    Past Medical History:  Diagnosis Date   Anxiety    Bipolar disorder (HCC)    Depression    Hypertension    Type 2 diabetes mellitus (HCC)     Patient Active Problem List   Diagnosis Date Noted   Low back pain radiating to left lower extremity 09/06/2020   Dysuria 08/18/2020   Microscopic hematuria 08/18/2020   Chronic diastolic heart failure (HCC) 06/20/2018   Mixed hyperlipidemia 06/02/2018   Type 2 diabetes mellitus without complication, without long-term current use of insulin (HCC) 04/06/2018   Swelling of lower extremity 10/12/2017   Bipolar disorder current episode depressed (HCC) 02/03/2012   Major depressive disorder, recurrent episode (HCC) 12/20/2011   Paraspinal muscle spasm 09/10/2011   Shortness of breath 02/19/2011   Fatigue 02/19/2011   ANXIETY DISORDER 07/03/2009   Essential hypertension, benign 04/04/2007   OBESITY, NOS 06/09/2006   OSTEOARTHRITIS, MULTI SITES 06/09/2006    History reviewed. No pertinent surgical history.  OB History   No obstetric history on file.      Home Medications    Prior to Admission medications   Medication Sig Start Date End Date Taking? Authorizing Provider   Accu-Chek Softclix Lancets lancets Use three times daily. E11.9 09/11/20   Moses Manners, MD  Cholecalciferol (VITAMIN D3) 1.25 MG (50000 UT) CAPS Take 1 tablet by mouth once weekly for 8 weeks. 08/07/20   Moses Manners, MD  diclofenac Sodium (VOLTAREN) 1 % GEL Apply 2 g topically 4 (four) times daily. 09/05/20   Melene Plan, MD  fluconazole (DIFLUCAN) 150 MG tablet Take 1 tablet now, then 1 tablet in 72 hours 08/18/20   Mullis, Kiersten P, DO  fluticasone (FLONASE) 50 MCG/ACT nasal spray Place 1 spray into both nostrils daily. Patient not taking: Reported on 08/07/2020 12/14/19   Merrilee Jansky, MD  furosemide (LASIX) 20 MG tablet Take 1 tablet (20 mg total) by mouth daily. 09/23/20   Towanda Octave, MD  glucose blood (ACCU-CHEK GUIDE) test strip Use as instructed 09/11/20   Moses Manners, MD  insulin aspart (NOVOLOG FLEXPEN) 100 UNIT/ML FlexPen Inject 5 Units into the skin 3 (three) times daily with meals. Inject 6 units with breakfast and lunch, and 4 units with dinner 10/20/20   Kathrin Ruddy, RPH-CPP  Insulin Pen Needle (BD PEN NEEDLE NANO U/F) 32G X 4 MM MISC 1 Container by Does not apply route as needed. 08/14/20   Kathrin Ruddy, RPH-CPP  JARDIANCE 25 MG TABS tablet TAKE 1 TABLET BY MOUTH DAILY Patient not taking: No sig reported 07/29/20   Shirlean Mylar, MD  LANTUS Vicente Masson  100 UNIT/ML Solostar Pen ADMINISTER 20 TO 40 UNITS UNDER THE SKIN EVERY MORNING. START 20 UNITS EVERY MORNING AND. INCREASE BY 2 UNITS PER DAY UNTIL FASTING CBGS ARE 100 10/15/20   Towanda OctavePatel, Poonam, MD  lidocaine (LIDODERM) 5 % Place 1 patch onto the skin daily. Remove & Discard patch within 12 hours or as directed by MD 09/05/20   Melene PlanKim, Rachel E, MD  lisinopril (ZESTRIL) 20 MG tablet TAKE 1 TABLET(20 MG) BY MOUTH DAILY 01/28/20   Shirlean MylarMahoney, Caitlin, MD  loratadine (CLARITIN) 10 MG tablet Take 1 tablet (10 mg total) by mouth daily. Patient not taking: Reported on 08/07/2020 12/14/19   Merrilee JanskyLamptey, Philip O, MD  metFORMIN  (GLUCOPHAGE-XR) 500 MG 24 hr tablet TAKE 2 TABLETS(1000 MG) BY MOUTH TWICE DAILY 01/26/20   Shirlean MylarMahoney, Caitlin, MD  methocarbamol (ROBAXIN) 500 MG tablet Take 1 tablet (500 mg total) by mouth 4 (four) times daily. 09/05/20   Melene PlanKim, Rachel E, MD  ondansetron (ZOFRAN ODT) 4 MG disintegrating tablet Take 1 tablet (4 mg total) by mouth every 8 (eight) hours as needed for nausea or vomiting. Patient not taking: Reported on 08/07/2020 06/11/20   Particia NearingLane, Rachel Elizabeth, PA-C  rosuvastatin (CRESTOR) 20 MG tablet TAKE 1 TABLET(20 MG) BY MOUTH DAILY 01/20/20   Shirlean MylarMahoney, Caitlin, MD    Family History Family History  Problem Relation Age of Onset   Stroke Maternal Grandmother     Social History Social History   Tobacco Use   Smoking status: Former    Packs/day: 0.30    Types: Cigarettes   Smokeless tobacco: Never  Substance Use Topics   Alcohol use: No   Drug use: No     Allergies   Patient has no known allergies.   Review of Systems Review of Systems Per HPI  Physical Exam Triage Vital Signs ED Triage Vitals  Enc Vitals Group     BP 10/30/20 1839 111/61     Pulse Rate 10/30/20 1839 95     Resp 10/30/20 1839 18     Temp 10/30/20 1839 98.4 F (36.9 C)     Temp Source 10/30/20 1839 Oral     SpO2 10/30/20 1839 97 %     Weight --      Height --      Head Circumference --      Peak Flow --      Pain Score 10/30/20 1837 8     Pain Loc --      Pain Edu? --      Excl. in GC? --    No data found.  Updated Vital Signs BP 111/61 (BP Location: Left Arm)   Pulse 95   Temp 98.4 F (36.9 C) (Oral)   Resp 18   SpO2 97%   Visual Acuity Right Eye Distance:   Left Eye Distance:   Bilateral Distance:    Right Eye Near:   Left Eye Near:    Bilateral Near:     Physical Exam Constitutional:      General: She is not in acute distress.    Appearance: Normal appearance.  HENT:     Head: Normocephalic and atraumatic.     Right Ear: Tympanic membrane and ear canal normal.     Left  Ear: Tympanic membrane and ear canal normal.     Nose: Mucosal edema and congestion present.     Mouth/Throat:     Pharynx: No posterior oropharyngeal erythema.  Eyes:     Extraocular Movements: Extraocular movements intact.  Conjunctiva/sclera: Conjunctivae normal.  Cardiovascular:     Rate and Rhythm: Normal rate and regular rhythm.     Pulses: Normal pulses.     Heart sounds: Normal heart sounds.  Pulmonary:     Effort: Pulmonary effort is normal. No respiratory distress.     Breath sounds: Normal breath sounds.  Abdominal:     General: Abdomen is flat. Bowel sounds are normal. There is no distension.     Palpations: Abdomen is soft.     Tenderness: There is no abdominal tenderness.  Musculoskeletal:     Cervical back: Normal.     Thoracic back: Normal.     Lumbar back: Normal.     Comments: No tenderness to palpation throughout back or directly over spine.  No tenderness to palpation to arms.  Skin:    General: Skin is warm and dry.  Neurological:     General: No focal deficit present.     Mental Status: She is alert and oriented to person, place, and time. Mental status is at baseline.  Psychiatric:        Mood and Affect: Mood normal.        Behavior: Behavior normal.        Thought Content: Thought content normal.        Judgment: Judgment normal.     UC Treatments / Results  Labs (all labs ordered are listed, but only abnormal results are displayed) Labs Reviewed  CULTURE, GROUP A STREP ([facility])  SARS CORONAVIRUS 2 (TAT 6-24 HRS)  COMPREHENSIVE METABOLIC PANEL  CBC  VITAMIN D 25 HYDROXY (VIT D DEFICIENCY, FRACTURES)  POCT RAPID STREP A, ED / UC  POC INFLUENZA A AND B ANTIGEN (URGENT CARE ONLY)    EKG   Radiology No results found.  Procedures Procedures (including critical care time)  Medications Ordered in UC Medications - No data to display  Initial Impression / Assessment and Plan / UC Course  I have reviewed the triage vital signs and the  nursing notes.  Pertinent labs & imaging results that were available during my care of the patient were reviewed by me and considered in my medical decision making (see chart for details).     Suspect start of viral upper respiratory infection due to appearance of nares on exam.  Discussed over-the-counter medication options for viral symptoms.  COVID-19 viral swab is pending throat culture pending.  Rapid flu test and strep test were negative in urgent care.  We will check basic lab work including vitamin D to detect if symptoms are a result of low vitamin D.  Patient referred to PCP for further management of vitamin D.Discussed strict return precautions. Patient verbalized understanding and is agreeable with plan.  Final Clinical Impressions(s) / UC Diagnoses   Final diagnoses:  Generalized body aches  Flu-like symptoms  Encounter for laboratory testing for COVID-19 virus     Discharge Instructions      You likely having a viral upper respiratory infection. We recommended symptom control. I expect your symptoms to start improving in the next 1-2 weeks.   1. Take a daily allergy pill/anti-histamine like Zyrtec, Claritin, or Store brand consistently for 2 weeks  2. For congestion you may try an oral decongestant like Mucinex or sudafed. You may also try intranasal flonase nasal spray or saline irrigations (neti pot, sinus cleanse)  3. For your sore throat you may try cepacol lozenges, salt water gargles, throat spray. Treatment of congestion may also help your sore throat.  4. For cough you may try Robitussen, Mucinex DM  5. Take Tylenol or Ibuprofen to help with pain/inflammation  6. Stay hydrated, drink plenty of fluids to keep throat coated and less irritated  Honey Tea For cough/sore throat try using a honey-based tea. Use 3 teaspoons of honey with juice squeezed from half lemon. Place shaved pieces of ginger into 1/2-1 cup of water and warm over stove top. Then mix the  ingredients and repeat every 4 hours as needed.  Your rapid strep test and flu test were negative in urgent care today.  COVID-19 viral swab and throat culture are pending.  We will call if these are positive.  Blood work is also pending.  We will call if any abnormalities are present.  Please follow-up with your primary care physician for further management of vitamin D levels.     ED Prescriptions   None    PDMP not reviewed this encounter.   Lance Muss, FNP 10/30/20 1934

## 2020-10-30 NOTE — ED Triage Notes (Signed)
Pt in with c/o body aches from upper back down her arms x 2 days  Pt states she has also been feeling tired  Pt states was taking vitamin D3 and ran out 2 weeks ago

## 2020-10-31 LAB — SARS CORONAVIRUS 2 (TAT 6-24 HRS): SARS Coronavirus 2: NEGATIVE

## 2020-11-02 LAB — CULTURE, GROUP A STREP (THRC)

## 2020-11-05 ENCOUNTER — Other Ambulatory Visit: Payer: Self-pay | Admitting: Family Medicine

## 2020-11-05 DIAGNOSIS — E559 Vitamin D deficiency, unspecified: Secondary | ICD-10-CM

## 2020-11-10 ENCOUNTER — Other Ambulatory Visit: Payer: Self-pay | Admitting: Family Medicine

## 2020-12-15 ENCOUNTER — Other Ambulatory Visit: Payer: Self-pay

## 2020-12-15 ENCOUNTER — Ambulatory Visit (HOSPITAL_COMMUNITY)
Admission: RE | Admit: 2020-12-15 | Discharge: 2020-12-15 | Disposition: A | Discharge status: Home / Self Care | Payer: Medicaid Other | Source: Ambulatory Visit | Attending: Family Medicine | Admitting: Family Medicine

## 2020-12-15 ENCOUNTER — Ambulatory Visit (HOSPITAL_COMMUNITY)
Admission: EM | Admit: 2020-12-15 | Discharge: 2020-12-15 | Disposition: A | Discharge status: Home / Self Care | Payer: Medicaid Other | Attending: Family Medicine | Admitting: Family Medicine

## 2020-12-15 ENCOUNTER — Ambulatory Visit (HOSPITAL_COMMUNITY)
Admission: RE | Admit: 2020-12-15 | Discharge: 2020-12-15 | Disposition: A | Discharge status: Home / Self Care | Payer: Medicaid Other | Source: Ambulatory Visit

## 2020-12-15 ENCOUNTER — Telehealth (HOSPITAL_COMMUNITY): Payer: Self-pay | Admitting: Emergency Medicine

## 2020-12-15 ENCOUNTER — Encounter (HOSPITAL_COMMUNITY): Payer: Self-pay | Admitting: Emergency Medicine

## 2020-12-15 DIAGNOSIS — M7989 Other specified soft tissue disorders: Secondary | ICD-10-CM

## 2020-12-15 DIAGNOSIS — R609 Edema, unspecified: Secondary | ICD-10-CM | POA: Insufficient documentation

## 2020-12-15 NOTE — ED Notes (Signed)
Patient to go to [facility] registration to check in for outpatient study.  Study to be done at 11:00 today.  Notified provider

## 2020-12-15 NOTE — ED Triage Notes (Signed)
Swelling above left wrist, swelling below elbow, tingling into hand.  Onset 3 weeks ago.  Denies injury.  Patient is right handed

## 2020-12-15 NOTE — Telephone Encounter (Signed)
Greg from vascular called, reports left upper extremity US was negative for dvt.  Notified rachel, pa.  No further orders

## 2020-12-15 NOTE — Progress Notes (Signed)
Left upper extremity venous duplex has been completed. Preliminary results can be found in CV Proc through chart review.  Results were given to [facility].  12/15/20 11:36 AM Olen Cordial RVT

## 2020-12-19 NOTE — ED Provider Notes (Signed)
[facility]    CSN: [id number] Arrival date & time: 12/15/20  5956      History   Chief Complaint Chief Complaint  Patient presents with   Arm Problem    HPI Teresa Chang is a 50 y.o. female.   Patient presenting today with left wrist, elbow swelling and numbness, tingling into the hand.  Sometimes has pain from the shoulder down on the side.  Onset was 3 weeks ago, the swelling does wax and wane.  Now has a localized area of swelling on the extensor surface of the left wrist.  She denies injury, discoloration, history of orthopedic issues to this region, fever, chills, chest pain, shortness of breath, dizziness, palpitations.  Has been trying elevation, ice, anti-inflammatories with no relief.   Past Medical History:  Diagnosis Date   Anxiety    Bipolar disorder (HCC)    Depression    Hypertension    Type 2 diabetes mellitus (HCC)     Patient Active Problem List   Diagnosis Date Noted   Low back pain radiating to left lower extremity 09/06/2020   Dysuria 08/18/2020   Microscopic hematuria 08/18/2020   Chronic diastolic heart failure (HCC) 06/20/2018   Mixed hyperlipidemia 06/02/2018   Type 2 diabetes mellitus without complication, without long-term current use of insulin (HCC) 04/06/2018   Swelling of lower extremity 10/12/2017   Bipolar disorder current episode depressed (HCC) 02/03/2012   Major depressive disorder, recurrent episode (HCC) 12/20/2011   Paraspinal muscle spasm 09/10/2011   Shortness of breath 02/19/2011   Fatigue 02/19/2011   ANXIETY DISORDER 07/03/2009   Essential hypertension, benign 04/04/2007   OBESITY, NOS 06/09/2006   OSTEOARTHRITIS, MULTI SITES 06/09/2006    History reviewed. No pertinent surgical history.  OB History   No obstetric history on file.      Home Medications    Prior to Admission medications   Medication Sig Start Date End Date Taking? Authorizing Provider  Accu-Chek Softclix Lancets lancets Use three  times daily. E11.9 09/11/20   Moses Manners, MD  Cholecalciferol (VITAMIN D3) 1.25 MG (50000 UT) CAPS TAKE 1 CAPSULE BY MOUTH 1 TIME WEEKLY FOR 8 WEEKS 11/08/20   Towanda Octave, MD  diclofenac Sodium (VOLTAREN) 1 % GEL Apply 2 g topically 4 (four) times daily. 09/05/20   Melene Plan, MD  fluconazole (DIFLUCAN) 150 MG tablet Take 1 tablet now, then 1 tablet in 72 hours Patient not taking: Reported on 12/15/2020 08/18/20   Orpah Cobb P, DO  fluticasone (FLONASE) 50 MCG/ACT nasal spray Place 1 spray into both nostrils daily. Patient not taking: Reported on 08/07/2020 12/14/19   Merrilee Jansky, MD  furosemide (LASIX) 20 MG tablet TAKE 1 TABLET(20 MG) BY MOUTH DAILY 11/10/20   Towanda Octave, MD  glucose blood (ACCU-CHEK GUIDE) test strip Use as instructed 09/11/20   Moses Manners, MD  insulin aspart (NOVOLOG FLEXPEN) 100 UNIT/ML FlexPen Inject 5 Units into the skin 3 (three) times daily with meals. Inject 6 units with breakfast and lunch, and 4 units with dinner 10/20/20   Kathrin Ruddy, RPH-CPP  Insulin Pen Needle (BD PEN NEEDLE NANO U/F) 32G X 4 MM MISC 1 Container by Does not apply route as needed. 08/14/20   Kathrin Ruddy, RPH-CPP  JARDIANCE 25 MG TABS tablet TAKE 1 TABLET BY MOUTH DAILY 07/29/20   Shirlean Mylar, MD  LANTUS SOLOSTAR 100 UNIT/ML Solostar Pen ADMINISTER 20 TO 40 UNITS UNDER THE SKIN EVERY MORNING. START 20 UNITS  EVERY MORNING AND. INCREASE BY 2 UNITS PER DAY UNTIL FASTING CBGS ARE 100 10/15/20   Towanda Octave, MD  lidocaine (LIDODERM) 5 % Place 1 patch onto the skin daily. Remove & Discard patch within 12 hours or as directed by MD 09/05/20   Melene Plan, MD  lisinopril (ZESTRIL) 20 MG tablet TAKE 1 TABLET(20 MG) BY MOUTH DAILY 01/28/20   Shirlean Mylar, MD  loratadine (CLARITIN) 10 MG tablet Take 1 tablet (10 mg total) by mouth daily. Patient not taking: Reported on 08/07/2020 12/14/19   Merrilee Jansky, MD  metFORMIN (GLUCOPHAGE-XR) 500 MG 24 hr tablet TAKE 2 TABLETS(1000 MG) BY  MOUTH TWICE DAILY 01/26/20   Shirlean Mylar, MD  methocarbamol (ROBAXIN) 500 MG tablet Take 1 tablet (500 mg total) by mouth 4 (four) times daily. Patient not taking: Reported on 12/15/2020 09/05/20   Melene Plan, MD  ondansetron (ZOFRAN ODT) 4 MG disintegrating tablet Take 1 tablet (4 mg total) by mouth every 8 (eight) hours as needed for nausea or vomiting. Patient not taking: Reported on 08/07/2020 06/11/20   Particia Nearing, PA-C  rosuvastatin (CRESTOR) 20 MG tablet TAKE 1 TABLET(20 MG) BY MOUTH DAILY 01/20/20   Shirlean Mylar, MD    Family History Family History  Problem Relation Age of Onset   Diabetes Mother    Stroke Maternal Grandmother     Social History Social History   Tobacco Use   Smoking status: Former    Packs/day: 0.30    Types: Cigarettes   Smokeless tobacco: Never  Vaping Use   Vaping Use: Never used  Substance Use Topics   Alcohol use: No   Drug use: No     Allergies   Patient has no known allergies.   Review of Systems Review of Systems Per HPI  Physical Exam Triage Vital Signs ED Triage Vitals  Enc Vitals Group     BP 12/15/20 0934 105/72     Pulse Rate 12/15/20 0934 73     Resp 12/15/20 0934 20     Temp 12/15/20 0934 98.3 F (36.8 C)     Temp Source 12/15/20 0934 Oral     SpO2 12/15/20 0934 96 %     Weight --      Height --      Head Circumference --      Peak Flow --      Pain Score 12/15/20 0930 7     Pain Loc --      Pain Edu? --      Excl. in GC? --    No data found.  Updated Vital Signs BP 105/72 (BP Location: Right Arm) Comment (BP Location): large cuff  Pulse 73   Temp 98.3 F (36.8 C) (Oral)   Resp 20   LMP 12/15/2020   SpO2 96%   Visual Acuity Right Eye Distance:   Left Eye Distance:   Bilateral Distance:    Right Eye Near:   Left Eye Near:    Bilateral Near:     Physical Exam Vitals and nursing note reviewed.  Constitutional:      Appearance: Normal appearance. She is not ill-appearing.  HENT:      Head: Atraumatic.  Eyes:     Extraocular Movements: Extraocular movements intact.     Conjunctiva/sclera: Conjunctivae normal.  Cardiovascular:     Rate and Rhythm: Normal rate and regular rhythm.     Heart sounds: Normal heart sounds.  Pulmonary:     Effort: Pulmonary effort  is normal.     Breath sounds: Normal breath sounds.  Musculoskeletal:        General: Swelling and tenderness present. No deformity. Normal range of motion.     Cervical back: Normal range of motion and neck supple.     Comments: Localized area of edema left dorsal aspect of wrist, no obvious cystic structure palpable at this time but edema is well-circumscribed to this area.  No decreased range of motion in any joint of the left upper extremity  Skin:    General: Skin is warm and dry.     Findings: No bruising, erythema or lesion.  Neurological:     Mental Status: She is alert and oriented to person, place, and time.     Sensory: No sensory deficit.     Motor: No weakness.  Psychiatric:        Mood and Affect: Mood normal.        Thought Content: Thought content normal.        Judgment: Judgment normal.   UC Treatments / Results  Labs (all labs ordered are listed, but only abnormal results are displayed) Labs Reviewed - No data to display  EKG   Radiology No results found.  Procedures Procedures (including critical care time)  Medications Ordered in UC Medications - No data to display  Initial Impression / Assessment and Plan / UC Course  I have reviewed the triage vital signs and the nursing notes.  Pertinent labs & imaging results that were available during my care of the patient were reviewed by me and considered in my medical decision making (see chart for details).     Discussed possibly inflammatory nature of the intermittent edema, but given how well localized the wrist edema is possibly a cystic type structure coming up in that area.  We will also obtain a left upper extremity DVT  rule out ultrasound to rule out more emergent causes of swelling in these areas.  Anti-inflammatories, ice, elevation reviewed.  Has follow-up scheduled with primary care in the next few days for recheck.  Follow-up sooner if worsening.  Final Clinical Impressions(s) / UC Diagnoses   Final diagnoses:  Left arm swelling   Discharge Instructions   None    ED Prescriptions   None    PDMP not reviewed this encounter.   Roosvelt Maser [place], [place] 12/19/20 [phone]

## 2020-12-22 ENCOUNTER — Ambulatory Visit (INDEPENDENT_AMBULATORY_CARE_PROVIDER_SITE_OTHER): Payer: Medicaid Other | Admitting: Family Medicine

## 2020-12-22 ENCOUNTER — Encounter: Payer: Self-pay | Admitting: Family Medicine

## 2020-12-22 ENCOUNTER — Other Ambulatory Visit: Payer: Self-pay

## 2020-12-22 DIAGNOSIS — Z1159 Encounter for screening for other viral diseases: Secondary | ICD-10-CM | POA: Insufficient documentation

## 2020-12-22 DIAGNOSIS — E782 Mixed hyperlipidemia: Secondary | ICD-10-CM

## 2020-12-22 DIAGNOSIS — G5602 Carpal tunnel syndrome, left upper limb: Secondary | ICD-10-CM | POA: Diagnosis not present

## 2020-12-22 DIAGNOSIS — E119 Type 2 diabetes mellitus without complications: Secondary | ICD-10-CM

## 2020-12-22 NOTE — Patient Instructions (Addendum)
Please make an appointment with Dr. Allena Katz soon.  You are behind on many routine health maintenance things.  My best guess diagnosis for your arm numbness is carpal tunnel syndrome.  Let's see how the wrist brace works. I will call you with your A1C results plus the other things tomorrow. Your goal is to be less than 7.  7-9 is so-so control.  Above 9 is bad.  Your last A1C in April was 12.

## 2020-12-24 ENCOUNTER — Encounter: Payer: Self-pay | Admitting: Family Medicine

## 2020-12-24 NOTE — Progress Notes (Signed)
    SUBJECTIVE:   CHIEF COMPLAINT / HPI:   Left hand/forearm numbness and swelling.  Right handed woman with 2-3 weeks of prgressive numbness and swelling of left hand and forearm.  Patient states numbness is in a glove distribution.  No trauma.  No weakness.  Does have some chronic mild neck stiffness and cracking but no flare of her neck pain.  Worse at night.  Does medical transcription so lots of typing.    Seen and evaluated at urgent care.  Venous dopplers obtained to R/O upper ext DVT.  Dopplers neg  Diabetic Last A1C 4/22 = 12.7.  States doing better recently.       OBJECTIVE:   BP 104/82   Pulse 71   Ht 5\' 7"  (1.702 m)   Wt 213 lb 9.6 oz (96.9 kg)   LMP 12/15/2020   SpO2 100%   BMI 33.45 kg/m   Neck FROM. Bilateral upper ext, normal strength.  Perhaps some mild swelling on left distal forearm.  No skin changes.  Normal sensation.  Neg provacative testing for carpal tunnel.  ASSESSMENT/PLAN:   Type 2 diabetes mellitus without complication, without long-term current use of insulin (HCC) Very poor control based on last A1C.  Recheck.    Left carpal tunnel syndrome Carpal tunnel is the best fit clinical dx and still not a perfect fit.  Will treat with cockup wrist splint.  Check TSH.  Consider referral/additional WU if not improve by wrist splint.       02/14/2021, MD [facility]

## 2020-12-24 NOTE — Assessment & Plan Note (Signed)
Very poor control based on last A1C.  Recheck.

## 2020-12-24 NOTE — Assessment & Plan Note (Signed)
Carpal tunnel is the best fit clinical dx and still not a perfect fit.  Will treat with cockup wrist splint.  Check TSH.  Consider referral/additional WU if not improve by wrist splint.

## 2021-01-06 ENCOUNTER — Other Ambulatory Visit: Payer: Self-pay | Admitting: Family Medicine

## 2021-01-14 ENCOUNTER — Other Ambulatory Visit: Payer: Self-pay | Admitting: Family Medicine

## 2021-01-14 DIAGNOSIS — E119 Type 2 diabetes mellitus without complications: Secondary | ICD-10-CM

## 2021-01-20 ENCOUNTER — Other Ambulatory Visit: Payer: Self-pay | Admitting: Family Medicine

## 2021-01-20 DIAGNOSIS — E559 Vitamin D deficiency, unspecified: Secondary | ICD-10-CM

## 2021-02-09 ENCOUNTER — Telehealth: Payer: Self-pay

## 2021-02-09 NOTE — Telephone Encounter (Signed)
Patient calls nurse line requesting to speak with PCP. Patient reports her hair is falling out and she believes it is medication related. Patient reports symptoms have been present for ~1 month. Patient denies any pain or rash on scalp.   Patient informed PCP is out on maternity leave. Patient would like to speak with Hensel, as she saw him last.

## 2021-02-28 ENCOUNTER — Other Ambulatory Visit: Payer: Self-pay | Admitting: Family Medicine

## 2021-03-09 ENCOUNTER — Other Ambulatory Visit: Payer: Self-pay

## 2021-03-09 ENCOUNTER — Ambulatory Visit (INDEPENDENT_AMBULATORY_CARE_PROVIDER_SITE_OTHER): Payer: Medicaid Other | Admitting: Family Medicine

## 2021-03-09 ENCOUNTER — Encounter: Payer: Self-pay | Admitting: Family Medicine

## 2021-03-09 DIAGNOSIS — E119 Type 2 diabetes mellitus without complications: Secondary | ICD-10-CM

## 2021-03-09 DIAGNOSIS — L659 Nonscarring hair loss, unspecified: Secondary | ICD-10-CM

## 2021-03-09 DIAGNOSIS — E559 Vitamin D deficiency, unspecified: Secondary | ICD-10-CM | POA: Diagnosis not present

## 2021-03-09 DIAGNOSIS — Z1159 Encounter for screening for other viral diseases: Secondary | ICD-10-CM | POA: Diagnosis not present

## 2021-03-09 DIAGNOSIS — I5032 Chronic diastolic (congestive) heart failure: Secondary | ICD-10-CM

## 2021-03-09 LAB — POCT GLYCOSYLATED HEMOGLOBIN (HGB A1C): HbA1c, POC (controlled diabetic range): 8.1 % — AB (ref 0.0–7.0)

## 2021-03-09 MED ORDER — ROSUVASTATIN CALCIUM 20 MG PO TABS: ORAL_TABLET | ORAL | 3 refills | Status: DC

## 2021-03-09 MED ORDER — LANTUS SOLOSTAR 100 UNIT/ML ~~LOC~~ SOPN: 16.0000 [IU] | PEN_INJECTOR | Freq: Every day | SUBCUTANEOUS | 3 refills | Status: DC

## 2021-03-09 MED ORDER — VITAMIN D3 1.25 MG (50000 UT) PO CAPS: ORAL_CAPSULE | ORAL | 3 refills | Status: DC

## 2021-03-09 NOTE — Progress Notes (Signed)
    SUBJECTIVE:   CHIEF COMPLAINT / HPI:   Multiple issues: She is concerned with: Wants me to refill her vitamin D Rx.  Does not feel good when stops taking.  Reviewed labs.  Never high.  Has some recent lows.  It does sound like long term supplement is fine. Hair loss: Recent tSH normal.  States has some depression and anxiety but not as much as recently.  Mom died in 2021/01/26and she feels she has rebounded well Weight gain.  Hx of CHF.  (HFpEF).  Denies DOE.  Has some minor ankle swelling.  States has not changed diet.  Has appointment with cards next week. I wanted to talk about  DM.  Nice drop in A1C from 12.7 down to 8.1.  Talked about "beating" diabetes (like an acute illness) and getting off meds.   Behind on many HPDP measures.  She agreed to Hep C screen and foot exam today.  Will get pap next visit.  Reluctant to commit to additonal measures.      OBJECTIVE:   BP 127/79   Pulse 82   Ht 5\' 7"  (1.702 m)   Wt 223 lb (101.2 kg)   SpO2 100%   BMI 34.93 kg/m   Lungs clear Cardiac RRR without m or g Ext trace bilateral edema.  ASSESSMENT/PLAN:   Vitamin D deficiency Refilled Vit D weekly  Type 2 diabetes mellitus without complication, without long-term current use of insulin (HCC) Increase lantus dosing.  Focus on diet and exercise to promote weight loss.  Educated about dm as a chronic condition.  Chronic diastolic heart failure (HCC) Low salt diet.  I will let cards decide if increase lasis.  Encounter for hepatitis C screening test for low risk patient Screen x 1.  Seems low risk.  Hair loss For now, focus on optimizing DM control.       , MD [facility]

## 2021-03-09 NOTE — Assessment & Plan Note (Signed)
Increase lantus dosing.  Focus on diet and exercise to promote weight loss.  Educated about dm as a chronic condition.

## 2021-03-09 NOTE — Patient Instructions (Addendum)
For diabetes, we look at the A1C, which is a three month test of diabetes. Goal is A1C less than 7. A1C between 7-9 is so-so A1C above 9 is bad.   Today you are 8.1.  In April you were 12.7, bad.  You have made good progress since April.   The only change in medicine for diabetes is to increase your once a day insulin to 16 units.  Please also get more serious about about diet and exercise.  Please come back in three months for a recheck of your diabetes and for a PAP smear. You are behind on several things we recommend for you to stay healthy.  I hope you let me or whoever sees you next do a few more things.   I think you a trace amount of fluid.  I will let your heart doctor decide whether to increase your fluid pill.  You can help by being very serious about cutting down on sodium/salt in your diet.

## 2021-03-09 NOTE — Assessment & Plan Note (Signed)
For now, focus on optimizing DM control.

## 2021-03-09 NOTE — Assessment & Plan Note (Signed)
Refilled Vit D weekly

## 2021-03-09 NOTE — Assessment & Plan Note (Signed)
Low salt diet.  I will let cards decide if increase lasis.

## 2021-03-09 NOTE — Assessment & Plan Note (Signed)
Screen x 1.  Seems low risk.

## 2021-03-10 LAB — BASIC METABOLIC PANEL
BUN/Creatinine Ratio: 8 — ABNORMAL LOW (ref 9–23)
BUN: 8 mg/dL (ref 6–24)
CO2: 20 mmol/L (ref 20–29)
Calcium: 8.8 mg/dL (ref 8.7–10.2)
Chloride: 107 mmol/L — ABNORMAL HIGH (ref 96–106)
Creatinine, Ser: 1.06 mg/dL — ABNORMAL HIGH (ref 0.57–1.00)
Glucose: 169 mg/dL — ABNORMAL HIGH (ref 70–99)
Potassium: 4 mmol/L (ref 3.5–5.2)
Sodium: 140 mmol/L (ref 134–144)
eGFR: 64 mL/min/{1.73_m2} (ref >59)

## 2021-03-10 LAB — HEPATITIS C ANTIBODY: Hep C Virus Ab: <0.1 s/co ratio (ref 0.0–0.9)

## 2021-03-19 ENCOUNTER — Other Ambulatory Visit: Payer: Self-pay | Admitting: Family Medicine

## 2021-03-19 ENCOUNTER — Other Ambulatory Visit: Payer: Self-pay

## 2021-03-19 ENCOUNTER — Ambulatory Visit (INDEPENDENT_AMBULATORY_CARE_PROVIDER_SITE_OTHER): Payer: Medicaid Other | Admitting: Cardiology

## 2021-03-19 ENCOUNTER — Encounter (HOSPITAL_BASED_OUTPATIENT_CLINIC_OR_DEPARTMENT_OTHER): Payer: Self-pay | Admitting: Cardiology

## 2021-03-19 VITALS — BP 128/74 | HR 96 | Ht 67.0 in | Wt 221.6 lb

## 2021-03-19 DIAGNOSIS — I5032 Chronic diastolic (congestive) heart failure: Secondary | ICD-10-CM | POA: Diagnosis not present

## 2021-03-19 DIAGNOSIS — Z794 Long term (current) use of insulin: Secondary | ICD-10-CM | POA: Diagnosis not present

## 2021-03-19 DIAGNOSIS — E782 Mixed hyperlipidemia: Secondary | ICD-10-CM

## 2021-03-19 DIAGNOSIS — Z7189 Other specified counseling: Secondary | ICD-10-CM | POA: Diagnosis not present

## 2021-03-19 DIAGNOSIS — I10 Essential (primary) hypertension: Secondary | ICD-10-CM

## 2021-03-19 DIAGNOSIS — E119 Type 2 diabetes mellitus without complications: Secondary | ICD-10-CM

## 2021-03-19 DIAGNOSIS — R635 Abnormal weight gain: Secondary | ICD-10-CM | POA: Diagnosis not present

## 2021-03-19 NOTE — Patient Instructions (Signed)
Medication Instructions:  Your Physician recommend you continue on your current medication as directed.    I would ask your endocrinologist about GLP1RA like ozempic. I think the heart benefit, plus the diabetes and weight loss benefit, would help in the long term. It does affect your appetite, but it takes diet and exercise to lose the weight as well. If you start this, you will likely have to come down on your insulin dose, so this should be closely monitored by your diabetes team.  Heart looks good, no fluid today.  *If you need a refill on your cardiac medications before your next appointment, please call your pharmacy*   Lab Work: None ordered today   Testing/Procedures: None ordered today   Follow-Up: At [facility], you and your health needs are our priority.  As part of our continuing mission to provide you with exceptional heart care, we have created designated Provider Care Teams.  These Care Teams include your primary Cardiologist (physician) and Advanced Practice Providers (APPs -  Physician Assistants and Nurse Practitioners) who all work together to provide you with the care you need, when you need it.  We recommend signing up for the patient portal called "MyChart".  Sign up information is provided on this After Visit Summary.  MyChart is used to connect with patients for Virtual Visits (Telemedicine).  Patients are able to view lab/test results, encounter notes, upcoming appointments, etc.  Non-urgent messages can be sent to your provider as well.   To learn more about what you can do with MyChart, go to [email or web address].    Your next appointment:   6 month(s)  The format for your next appointment:   In Person  Provider:   Jodelle Red, MD

## 2021-03-19 NOTE — Progress Notes (Signed)
Cardiology Office Note:    Date:  03/19/2021   ID:  Teresa Chang, DOB July 18, 1970, MRN [id number]  PCP:  Towanda Octave, MD  Cardiologist:  Jodelle Red, MD PhD  Referring MD: Towanda Octave, MD   CC: follow up  History of Present Illness:    Teresa Chang is a 50 y.o. female with a hx of chronic diastolic heart failure, hypertension who is seen in follow up. She was initially seen as a new patient on 11/18/17 at the request of Dr. Sydnee Cabal for evaluation and management of lower extremity edema.   Cardiac history: Noted a 1 year history of weight gain. Was 170-180lbs, now weighs up to 220 lbs. Came on gradually. After taking HCTZ swelling improved but her clothes are still tight. Was on BP meds in the past (lisinopril-HCTZ) but stopped after she began exercising, was off medications for about a year until she had to restart HCTZ. Reviewed the results of her echo with her, which showed diastolic dysfunction.  Today: She is feeling concerned. Recently, she has gained weight and she reports her stomach is much larger than it was this Summer. Her weight is typically around 170-180s lbs. However, her weight has increased to 220 lbs. Initially, she thought the weight was related to fluid but she is compliant taking her Lasix. Of note, her mother passed away over the Summer. During the grieving process, she was eating more and forgot to take her medications causing her diabetes to worsen.  Dr. Raymondo Band follows her for diabetes. He wants her to taper her insulin use, Currently, she takes 8 units of Novolog prior to eating in the morning and 14 units of Lantus in the evening. However, she notices her sugar drops to 70-80s in the evening. She intends to self-decrease her Lantus to 12 units nightly.   Occasionally, she experiences a quick flutter in her chest. She wonders if this sensation is related to her diabetes. This flutter can become a squeezing fleeting but it does not last.  She reports  intermittent shooting pain along her L forearm from her wrist to elbow with associated L finger tingling. She has also noticed a large swollen bump on her posterior L arm. She has been told the pain is related to carpal tunnel and was given a brace which has not helped.   She denies any shortness of breath, lightheadedness, headaches, syncope, orthopnea, PND, or exertional symptoms.  Past Medical History:  Diagnosis Date   Anxiety    Bipolar disorder (HCC)    Depression    Hypertension    Type 2 diabetes mellitus (HCC)     No past surgical history on file.  Current Medications: Current Outpatient Medications on File Prior to Visit  Medication Sig   Accu-Chek Softclix Lancets lancets Use three times daily. E11.9   Cholecalciferol (VITAMIN D3) 1.25 MG (50000 UT) CAPS TAKE 1 CAPSULE BY MOUTH 1 TIME WEEKLY   furosemide (LASIX) 20 MG tablet TAKE 1 TABLET(20 MG) BY MOUTH DAILY   glucose blood (ACCU-CHEK GUIDE) test strip Use as instructed   insulin aspart (NOVOLOG FLEXPEN) 100 UNIT/ML FlexPen Inject 5 Units into the skin 3 (three) times daily with meals. Inject 6 units with breakfast and lunch, and 4 units with dinner   insulin glargine (LANTUS SOLOSTAR) 100 UNIT/ML Solostar Pen Inject 16 Units into the skin daily.   Insulin Pen Needle (BD PEN NEEDLE NANO U/F) 32G X 4 MM MISC 1 Container by Does not apply route as needed.  JARDIANCE 25 MG TABS tablet TAKE 1 TABLET BY MOUTH DAILY   lisinopril (ZESTRIL) 20 MG tablet TAKE 1 TABLET(20 MG) BY MOUTH DAILY   loratadine (CLARITIN) 10 MG tablet Take 1 tablet (10 mg total) by mouth daily.   metFORMIN (GLUCOPHAGE-XR) 500 MG 24 hr tablet TAKE 2 TABLETS(1000 MG) BY MOUTH TWICE DAILY   methocarbamol (ROBAXIN) 500 MG tablet Take 1 tablet (500 mg total) by mouth 4 (four) times daily.   rosuvastatin (CRESTOR) 20 MG tablet TAKE 1 TABLET(20 MG) BY MOUTH DAILY for cholesterol   No current facility-administered medications on file prior to visit.      Allergies:   Patient has no known allergies.   Social History   Socioeconomic History   Marital status: Single    Spouse name: Not on file   Number of children: Not on file   Years of education: Not on file   Highest education level: Not on file  Occupational History   Not on file  Tobacco Use   Smoking status: Former    Packs/day: 0.30    Types: Cigarettes   Smokeless tobacco: Never  Vaping Use   Vaping Use: Never used  Substance and Sexual Activity   Alcohol use: No   Drug use: No   Sexual activity: Not on file  Other Topics Concern   Not on file  Social History Narrative   Not on file   Social Determinants of Health   Financial Resource Strain: Not on file  Food Insecurity: Not on file  Transportation Needs: Not on file  Physical Activity: Not on file  Stress: Not on file  Social Connections: Not on file   Quit smoking 3 years ago. Does not do routine exercise currently. .  Family History: The patient's family history includes Diabetes in her mother; Stroke in her maternal grandmother.  ROS:   Please see the history of present illness.   (+) Weight gain  (+) Chest flutter (+) L arm pain (+) L finger tingling (+) Swelling (L arm) Additional pertinent ROS:  EKGs/Labs/Other Studies Reviewed:    The following studies were personally reviewed today: Upper Venous Study 12/15/20 Right:  No evidence of thrombosis in the subclavian.  Left:  No evidence of deep vein thrombosis in the upper extremity. No evidence of  superficial vein thrombosis in the upper extremity.   Echo 10/25/17 Left ventricle: The cavity size was normal. There was mild   concentric hypertrophy. Systolic function was normal. The   estimated ejection fraction was in the range of 60% to 65%. Wall   motion was normal; there were no regional wall motion   abnormalities. Features are consistent with a pseudonormal left   ventricular filling pattern, with concomitant abnormal relaxation    and increased filling pressure (grade 2 diastolic dysfunction). - Mitral valve: There was mild regurgitation.  EKG:  EKG was personally reviewed 03/19/21: Sinus rhythm, rate 96 bpm 11/18/17 normal sinus rhythm  Recent Labs: 09/10/2020: TSH 0.994 09/23/2020: BNP 24.1 10/30/2020: ALT 12; Hemoglobin 12.6; Platelets 229 03/09/2021: BUN 8; Creatinine, Ser 1.06; Potassium 4.0; Sodium 140  Recent Lipid Panel    Component Value Date/Time   CHOL 133 02/28/2019 1342   TRIG 91 02/28/2019 1342   HDL 48 02/28/2019 1342   CHOLHDL 2.8 02/28/2019 1342   CHOLHDL 4.3 05/25/2016 1620   VLDL 39 (H) 05/25/2016 1620   LDLCALC 68 02/28/2019 1342    Physical Exam:    VS:  BP 128/74   Pulse 96  Ht 5\' 7"  (1.702 m)   Wt 221 lb 9.6 oz (100.5 kg)   SpO2 98%   BMI 34.71 kg/m     Wt Readings from Last 3 Encounters:  03/19/21 221 lb 9.6 oz (100.5 kg)  03/09/21 223 lb (101.2 kg)  12/22/20 213 lb 9.6 oz (96.9 kg)    GEN: Well nourished, well developed in no acute distress HEENT: Normal, moist mucous membranes NECK: No JVD CARDIAC: regular rhythm, normal S1 and S2, no rubs or gallops. No murmur. VASCULAR: Radial and DP pulses 2+ bilaterally. No carotid bruits RESPIRATORY:  Clear to auscultation without rales, wheezing or rhonchi  ABDOMEN: Soft, non-tender, non-distended MUSCULOSKELETAL:  Ambulates independently SKIN: Warm and dry, no edema NEUROLOGIC:  Alert and oriented x 3. No focal neuro deficits noted. PSYCHIATRIC:  Normal affect   ASSESSMENT:    1. Weight gain   2. Chronic diastolic heart failure (HCC)   3. Type 2 diabetes mellitus without complication, with long-term current use of insulin (HCC)   4. Mixed hyperlipidemia   5. Essential hypertension, benign   6. Cardiac risk counseling   7. Counseling on health promotion and disease prevention     PLAN:    Weight gain, abdominal girth increase: -she appears euvolemic on exam today -I suspect this is due to increased caloric intake and  insulin -discussed GLP1RA today, see below  Chronic diastolic heart failure: Echo shows grade II diastolic dysfunction. Euvolemic today -on Jardiance daily, also on daily lasix now. Cautioned about risk of dehydration with this. Monitor renal function  Type II diabetes, now on insulin: per PCP. A1c initially >15, now 8.1 -agree with jardiance also from CV perspective -on metformin and insulin as well -given her weight gain, we discussed GLP1RA today. I recommended she discuss with her endocrinologist as she is on insulin  Mixed hyperlipidemia: continue rosuvastatin  Hypertension: at goal, continue lisinopril  CV risk and prevention -recommend heart healthy/Mediterranean diet, with whole grains, fruits, vegetable, fish, lean meats, nuts, and olive oil. Limit salt. -recommend moderate walking, 3-5 times/week for 30-50 minutes each session. Aim for at least 150 minutes.week. Goal should be pace of 3 miles/hours, or walking 1.5 miles in 30 minutes -recommend avoidance of tobacco products. Avoid excess alcohol. -ASCVD risk score: The 10-year ASCVD risk score (Arnett DK, et al., 2019) is: 7.1%   Values used to calculate the score:     Age: 29 years     Sex: Female     Is Non-Hispanic African American: Yes     Diabetic: Yes     Tobacco smoker: No     Systolic Blood Pressure: 128 mmHg     Is BP treated: Yes     HDL Cholesterol: 48 mg/dL     Total Cholesterol: 133 mg/dL    Plan for follow up: 6 months  TIME SPENT WITH PATIENT: 25 minutes of direct patient care. More than 50% of that time was spent on coordination of care and counseling regarding CV risk with diabetes, diagnosis and treatment.  43, MD, PhD [place]  [facility]   Medication Adjustments/Labs and Tests Ordered: Current medicines are reviewed at length with the patient today.  Concerns regarding medicines are outlined above.  Orders Placed This Encounter  Procedures   EKG 12-Lead   No  orders of the defined types were placed in this encounter.  Patient Instructions  Medication Instructions:  Your Physician recommend you continue on your current medication as directed.    I would  ask your endocrinologist about GLP1RA like ozempic. I think the heart benefit, plus the diabetes and weight loss benefit, would help in the long term. It does affect your appetite, but it takes diet and exercise to lose the weight as well. If you start this, you will likely have to come down on your insulin dose, so this should be closely monitored by your diabetes team.  Heart looks good, no fluid today.  *If you need a refill on your cardiac medications before your next appointment, please call your pharmacy*   Lab Work: None ordered today   Testing/Procedures: None ordered today   Follow-Up: At [facility], you and your health needs are our priority.  As part of our continuing mission to provide you with exceptional heart care, we have created designated Provider Care Teams.  These Care Teams include your primary Cardiologist (physician) and Advanced Practice Providers (APPs -  Physician Assistants and Nurse Practitioners) who all work together to provide you with the care you need, when you need it.  We recommend signing up for the patient portal called "MyChart".  Sign up information is provided on this After Visit Summary.  MyChart is used to connect with patients for Virtual Visits (Telemedicine).  Patients are able to view lab/test results, encounter notes, upcoming appointments, etc.  Non-urgent messages can be sent to your provider as well.   To learn more about what you can do with MyChart, go to [email or web address].    Your next appointment:   6 month(s)  The format for your next appointment:   In Person  Provider:   Jodelle Red, MD       Sherin Quarry as a scribe for Jodelle Red, MD.,have documented all relevant documentation on the  behalf of Jodelle Red, MD,as directed by  Jodelle Red, MD while in the presence of Jodelle Red, MD.  I, Jodelle Red, MD, have reviewed all documentation for this visit. The documentation on 04/30/21 for the exam, diagnosis, procedures, and orders are all accurate and complete.   Signed, Jodelle Red, MD PhD 03/19/2021 1:17 PM    [facility]

## 2021-03-23 ENCOUNTER — Telehealth: Payer: Self-pay

## 2021-03-23 DIAGNOSIS — E119 Type 2 diabetes mellitus without complications: Secondary | ICD-10-CM

## 2021-03-23 NOTE — Telephone Encounter (Signed)
Contacted patient to discuss options for glycemic control.   Current regimen Lantus 14 units once daily in AM  Novolog 8 units once daily in the AM  Discussed options of starting GLP.   Plan to call pharmacy 12/13 and clarify availability of GLP products.  I plan to call her again with thoughts/ plan 12/13 at 1:30 PM

## 2021-03-23 NOTE — Telephone Encounter (Signed)
Patient calls nurse line requesting to speak with Dr. Raymondo Band regarding follow up from cardiologist. Please see below note.  I would ask your endocrinologist about GLP1RA like ozempic. I think the heart benefit, plus the diabetes and weight loss benefit, would help in the long term. It does affect your appetite, but it takes diet and exercise to lose the weight as well. If you start this, you will likely have to come down on your insulin dose, so this should be closely monitored by your diabetes team.  Patient states that she returns to work tomorrow and would like returned call today if possible.   Veronda Prude, RN

## 2021-03-24 MED ORDER — LANTUS SOLOSTAR 100 UNIT/ML ~~LOC~~ SOPN: 8.0000 [IU] | PEN_INJECTOR | Freq: Every day | SUBCUTANEOUS | Status: DC

## 2021-03-24 MED ORDER — NOVOLOG FLEXPEN 100 UNIT/ML ~~LOC~~ SOPN: 4.0000 [IU] | PEN_INJECTOR | Freq: Every day | SUBCUTANEOUS | Status: DC

## 2021-03-24 MED ORDER — TRULICITY 0.75 MG/0.5ML ~~LOC~~ SOAJ: 0.7500 mg | SUBCUTANEOUS | 1 refills | Status: DC

## 2021-03-24 NOTE — Telephone Encounter (Signed)
Noted and agree. 

## 2021-03-24 NOTE — Telephone Encounter (Signed)
Contacted patient and reviewed details on initiation of Trulicity (dulaglutide) 0.75mg  once weekly.  Patient educated on purpose, proper use and potential adverse effects of nausea.    Insulin dose adjustments were reviewed: Decrease Novolog (insulin aspart) from 8 units (large breakfast daily) to 4 units prior to breakfast Decrease Lantus (insulin glargine) from 14 to 8 units - again in the AM prior to breakfast.   Patient plans to make a visit in January to see Dr. Allena Katz.   Following instruction patient verbalized understanding of treatment plan.

## 2021-03-24 NOTE — Telephone Encounter (Signed)
Called Patient's pharmacy to clarify availability of Ozempic or Trulicity.  Medicaid covered Trulicity was available.   New prescription for Trulicity 0.75mg  once weekly sent to patient's pharmacy for pick-up.   Plan to call patient to share treatment plan including insulin dose adjustment later today.

## 2021-03-31 ENCOUNTER — Encounter: Payer: Self-pay | Admitting: Family Medicine

## 2021-04-02 DIAGNOSIS — H2512 Age-related nuclear cataract, left eye: Secondary | ICD-10-CM | POA: Diagnosis not present

## 2021-04-02 DIAGNOSIS — E119 Type 2 diabetes mellitus without complications: Secondary | ICD-10-CM | POA: Diagnosis not present

## 2021-04-02 DIAGNOSIS — Z794 Long term (current) use of insulin: Secondary | ICD-10-CM | POA: Diagnosis not present

## 2021-04-02 DIAGNOSIS — H401112 Primary open-angle glaucoma, right eye, moderate stage: Secondary | ICD-10-CM | POA: Diagnosis not present

## 2021-04-02 DIAGNOSIS — Z961 Presence of intraocular lens: Secondary | ICD-10-CM | POA: Diagnosis not present

## 2021-04-03 DIAGNOSIS — H5213 Myopia, bilateral: Secondary | ICD-10-CM | POA: Diagnosis not present

## 2021-04-25 ENCOUNTER — Other Ambulatory Visit: Payer: Self-pay | Admitting: Family Medicine

## 2021-04-25 DIAGNOSIS — E119 Type 2 diabetes mellitus without complications: Secondary | ICD-10-CM

## 2021-04-30 ENCOUNTER — Encounter (HOSPITAL_BASED_OUTPATIENT_CLINIC_OR_DEPARTMENT_OTHER): Payer: Self-pay | Admitting: Cardiology

## 2021-05-07 ENCOUNTER — Telehealth: Payer: Self-pay

## 2021-05-07 NOTE — Telephone Encounter (Signed)
Patient calls nurse line requesting refill on Flonase nasal spray. This is not on current medication list. Please advise if medication can be sent to the [facility] on [address].   Veronda Prude, RN

## 2021-05-08 ENCOUNTER — Other Ambulatory Visit: Payer: Self-pay | Admitting: Family Medicine

## 2021-05-08 MED ORDER — FLUTICASONE PROPIONATE 50 MCG/ACT NA SUSP: 2.0000 | Freq: Every day | NASAL | 6 refills | Status: DC

## 2021-05-08 NOTE — Telephone Encounter (Signed)
Called patient and informed.   Schyler Counsell C Aodhan Scheidt, RN  

## 2021-05-08 NOTE — Telephone Encounter (Signed)
Patient returns call to nurse line. Patient has been taking OTC cough medication, that has helped with cough. Patient states that she is having significant congestion and that flonase nasal spray has worked well in the past.   Please advise.   Talbot Grumbling, RN

## 2021-05-08 NOTE — Telephone Encounter (Signed)
Sent in flonase to pharmacy. Please let patient know Teresa Chang, thanks

## 2021-05-19 ENCOUNTER — Other Ambulatory Visit: Payer: Self-pay | Admitting: *Deleted

## 2021-05-20 MED ORDER — TRULICITY 0.75 MG/0.5ML ~~LOC~~ SOAJ: 0.7500 mg | SUBCUTANEOUS | 1 refills | Status: DC

## 2021-06-02 DIAGNOSIS — H524 Presbyopia: Secondary | ICD-10-CM | POA: Diagnosis not present

## 2021-07-02 ENCOUNTER — Other Ambulatory Visit: Payer: Self-pay | Admitting: Family Medicine

## 2021-07-09 ENCOUNTER — Other Ambulatory Visit: Payer: Self-pay | Admitting: Family Medicine

## 2021-07-21 ENCOUNTER — Telehealth: Payer: Self-pay

## 2021-07-21 NOTE — Telephone Encounter (Signed)
Patient calls nurse line requesting a referral to Dermatology.  ? ?Patient reports continued hair loss.  ? ?Last documented visit was 02/2021. ? ?Will forward to PCP to place referral.  ?

## 2021-07-27 ENCOUNTER — Other Ambulatory Visit: Payer: Self-pay | Admitting: Family Medicine

## 2021-07-27 DIAGNOSIS — L659 Nonscarring hair loss, unspecified: Secondary | ICD-10-CM

## 2021-07-27 NOTE — Telephone Encounter (Signed)
Placed referral to derm

## 2021-07-29 NOTE — Telephone Encounter (Signed)
Spoke with patient and answered all questions, she no longer needs to speak with Dr. Allena Katz.  Patient was concerned that she received a mychart message about an appointment and no one had even called her from the dermatology office.  I explained to patient the referral process from our office and how once we send it to the specialist, they schedule and notify their patient's different than [place].  Unfortunately there isn't anything I can do about an appt with them later in the year but I did offer to send the referral to [facility] medical for dermatology.  Patient voiced understanding and was appreciative of the explanation.  I told her once I heard back from [facility] on how long their wait is for derm, then I would call her back to let her know that the referral was being sent.  Teresa Chang,CMA ? ?

## 2021-07-29 NOTE — Telephone Encounter (Signed)
Patient calling back regarding this referral. Patient is very upset with the whole process that took place with this request. Per patient, she called on April 11th and left a VM stating she is continuing to have hair loss and wanted Dr. Allena Katz to call her back to discuss this. She said she didn't mention anything about a referral at this time. ? ?Patient calls today because she received an appointment notification on MyChart about an appointment in October. She was not contacted by our office or by the dermatology office she was referred to. She is very irritated by how "unprofessional and unorganized" this was.  ? ?Apologized about the misunderstanding on our part and told patient I would let Dr. Allena Katz know. Patient also is not happy with the dermatology appointment being in October and having to wait that long. Told patient I would also let our referral coordinator know.  ? ?Routing to [employer] for further evaluation. ?

## 2021-07-30 ENCOUNTER — Other Ambulatory Visit: Payer: Self-pay | Admitting: Family Medicine

## 2021-07-30 DIAGNOSIS — E119 Type 2 diabetes mellitus without complications: Secondary | ICD-10-CM

## 2021-08-06 ENCOUNTER — Other Ambulatory Visit: Payer: Self-pay

## 2021-08-06 DIAGNOSIS — E119 Type 2 diabetes mellitus without complications: Secondary | ICD-10-CM

## 2021-08-06 MED ORDER — NOVOLOG FLEXPEN 100 UNIT/ML ~~LOC~~ SOPN: 4.0000 [IU] | PEN_INJECTOR | Freq: Every day | SUBCUTANEOUS | Status: DC

## 2021-08-12 ENCOUNTER — Telehealth: Payer: Self-pay | Admitting: Pharmacist

## 2021-08-12 ENCOUNTER — Telehealth: Payer: Self-pay | Admitting: Family Medicine

## 2021-08-12 DIAGNOSIS — E119 Type 2 diabetes mellitus without complications: Secondary | ICD-10-CM

## 2021-08-12 NOTE — Telephone Encounter (Signed)
Clinical info completed on DOT form.  Placed form in Dr. Eliane Decree box for completion.   ? ?When form is completed, please route note to "RN Team" and place in wall pocket in front office. ? ? ?Trapper Meech Zimmerman Rumple, CMA  ?

## 2021-08-12 NOTE — Telephone Encounter (Signed)
Noted and agree. 

## 2021-08-12 NOTE — Telephone Encounter (Signed)
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 Last DOS was 03/09/21. Placed in [employer]. ?

## 2021-08-12 NOTE — Telephone Encounter (Signed)
Patient calls to report multiple low blood sugar readings. She was ask to contact our office in follow-up of her recent DOT physical.  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  ? ?She states that her Trulicity is doing well. She is tolerating the medicine without any side effect.  She reports decreased food intake and her current weight is 202 pounds which is decreased almost 20 pounds since her last in office weight in December (5 months ago). She states she feels like she eats enough, as she has plenty of energy, she denies feeding like she is not eating enough.  ? ?She reports her low readings are consistently in the afternoon. She reports readings of 76 and 86 recently.  Her highest recalled blood sugar in the last month was ~ 160.  ? ?Her current regimen for blood sugar control is: ?Trulicity 0.75mg  once weekly. ?Novolog 4 units prior to breakfast (not taking prior to other meals).  ?Lantus 8 units once daily.  ?Metformin 1000mg  BID ?Jardiance (empagliflozin) 25mg  daily. ? ?Following some discussion . We agreed to stop all INSULIN.  ?Discontinue Novolog and Lantus ?Continue all other medications as listed above.  ? ?I asked her to schedule a follow-up with her PCP, Dr. . She agreed.  ?She asked if we could fill out her forms related to low blood sugars.  I agreed with the plan to have her drop them off for our review and potential sign-off.  ? ?I anticipate that the discontinuation of insulin will significantly reduce risk of any hypoglycemia.   ? ?Total time with patient call and documentation of interaction: 17 minutes. ? ?F/U - I will discuss this patient with PCP, Dr. or one of the [facility] attending physicians to determine process for DOT form completion.  ? ? ?*Note - Furosemide was "recently stopped" by her cardiologist (per patient) ?I discontinued on medication list.  ?

## 2021-08-12 NOTE — Assessment & Plan Note (Signed)
Patient calls to report multiple low blood sugar readings. She was ask to contact our office in follow-up of her recent DOT physical.  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  ? ?She states that her Trulicity is doing well. She is tolerating the medicine without any side effect.  She reports decreased food intake and her current weight is 202 pounds which is decreased almost 20 pounds since her last in office weight in December (5 months ago). She states she feels like she eats enough, as she has plenty of energy, she denies feeding like she is not eating enough.  ? ?She reports her low readings are consistently in the afternoon. She reports readings of 76 and 86 recently.  Her highest recalled blood sugar in the last month was ~ 160.  ? ?Her current regimen for blood sugar control is: ?Trulicity 0.75mg  once weekly. ?Novolog 4 units prior to breakfast (not taking prior to other meals).  ?Lantus 8 units once daily.  ?Metformin 1000mg  BID ?Jardiance (empagliflozin) 25mg  daily. ? ?Following some discussion . We agreed to stop all INSULIN.  ?Discontinue Novolog and Lantus ?Continue all other medications as listed above.  ? ?I asked her to schedule a follow-up with her PCP, Dr. . She agreed.  ?She asked if we could fill out her forms related to low blood sugars.  I agreed with the plan to have her drop them off for our review and potential sign-off.  ? ?I anticipate that the discontinuation of insulin will significantly reduce risk of any hypoglycemia.   ?

## 2021-08-16 ENCOUNTER — Telehealth: Payer: Self-pay | Admitting: Family Medicine

## 2021-08-16 NOTE — Telephone Encounter (Signed)
Called pt to discuss DOT paperwork. Pt reports she did not actually need the DOT physical done, she already has a physician for that. The physician was concerned about her medication so they wanted her meds reviewed on the form. I will look at the form tomorrow and fill out appropriate parts and give it to the pt on 9th May-I have an appointment with her then. ? ?

## 2021-08-18 ENCOUNTER — Ambulatory Visit (INDEPENDENT_AMBULATORY_CARE_PROVIDER_SITE_OTHER): Payer: Medicaid Other | Admitting: Family Medicine

## 2021-08-18 ENCOUNTER — Other Ambulatory Visit (HOSPITAL_COMMUNITY)
Admission: RE | Admit: 2021-08-18 | Discharge: 2021-08-18 | Disposition: A | Discharge status: Home / Self Care | Payer: Medicaid Other | Source: Ambulatory Visit | Attending: Family Medicine | Admitting: Family Medicine

## 2021-08-18 ENCOUNTER — Encounter: Payer: Self-pay | Admitting: Family Medicine

## 2021-08-18 VITALS — BP 113/84 | HR 107 | Ht 67.0 in | Wt 202.8 lb

## 2021-08-18 DIAGNOSIS — E119 Type 2 diabetes mellitus without complications: Secondary | ICD-10-CM | POA: Diagnosis not present

## 2021-08-18 DIAGNOSIS — Z1231 Encounter for screening mammogram for malignant neoplasm of breast: Secondary | ICD-10-CM

## 2021-08-18 DIAGNOSIS — Z124 Encounter for screening for malignant neoplasm of cervix: Secondary | ICD-10-CM | POA: Diagnosis not present

## 2021-08-18 DIAGNOSIS — E785 Hyperlipidemia, unspecified: Secondary | ICD-10-CM | POA: Diagnosis not present

## 2021-08-18 DIAGNOSIS — Z0000 Encounter for general adult medical examination without abnormal findings: Secondary | ICD-10-CM | POA: Diagnosis not present

## 2021-08-18 DIAGNOSIS — Z1159 Encounter for screening for other viral diseases: Secondary | ICD-10-CM

## 2021-08-18 DIAGNOSIS — I10 Essential (primary) hypertension: Secondary | ICD-10-CM | POA: Diagnosis not present

## 2021-08-18 LAB — POCT GLYCOSYLATED HEMOGLOBIN (HGB A1C): HbA1c, POC (controlled diabetic range): 5.9 % (ref 0.0–7.0)

## 2021-08-18 NOTE — Patient Instructions (Signed)
Thank you for coming to see me today. It was a pleasure. A1 is 5.9- this is great. Your goal 6-7. ?Stop jardiance ? ?I have placed an order for your mammogram.  Please call [facility] at [phone] to schedule your appointment within one week.  ? ?We will get some labs today.  If they are abnormal or we need to do something about them, I will call you.  If they are normal, I will send you a message on MyChart (if it is active) or a letter in the mail.  If you don't hear from [place] in 2 weeks, please call the office at the number below.  ?Please follow-up with me as needed  ? ?If you have any questions or concerns, please do not hesitate to call the office at [phone]. ? ?Best wishes,  ? ?Dr Allena Katz   ?

## 2021-08-18 NOTE — Progress Notes (Signed)
? ? ?  SUBJECTIVE:  ? ?Chief compliant/HPI: annual examination ? ?Teresa Chang is a 51 y.o. who presents today for an annual exam.  ? ? ?Diabetes ?Patient's current diabetic medications include Jardiance, metformin and trulicity. Tolerating well without side effects.  Patient endorses compliance with these medications. Fasting CBGs are around 120 at home. Patient's last A1c was  ?Lab Results  ?Component Value Date  ? HGBA1C 5.9 08/18/2021  ? HGBA1C 8.1 (A) 03/09/2021  ? HGBA1C 12.7 (A) 08/06/2020  ? Denies abdominal pain, blurred vision, polyuria, polydipsia, hypoglycemia. Patient states they understand that diet and exercise can help with her diabetes.   ? ?Last Microalbumin, LDL, Creatinine: ?Lab Results  ?Component Value Date  ? MICROALBUR 10 02/28/2019  ? LDLCALC 54 08/18/2021  ? CREATININE 1.06 (H) 03/09/2021  ?  ? ?Hypertension ?Patient's current antihypertensive  medications include: lisinopril. Compliant with medications and tolerating well without side effects. Denies any SOB, CP, vision changes, LE edema, medication SEs, or symptoms of hypotension.  ? ?Most recent creatinine trend:  ?Lab Results  ?Component Value Date  ? CREATININE 1.06 (H) 03/09/2021  ? CREATININE 1.00 10/30/2020  ? CREATININE 0.77 09/10/2020  ?  ?Patient has had a BMP in the past 1 year. ? ?History tabs reviewed and updated. ? ?Review of systems form reviewed. ?  ?OBJECTIVE:  ? ?BP 113/84   Pulse (!) 107   Ht _0  (1.702 m)   Wt 202 lb 12.8 oz (92 kg)   SpO2 99%   BMI 31.76 kg/m?   ? ?General: Alert, no acute distress ?Cardio: Normal S1 and S2, RRR, no r/m/g ?Pulm: CTAB, normal work of breathing ?Abdomen: Bowel sounds normal. Abdomen soft and non-tender.  ?Extremities: No peripheral edema.  ?Neuro: Cranial nerves grossly intact  ? ?ASSESSMENT/PLAN:  ? ?Essential hypertension, benign ?BP at goal, continue lisinopril. ? ?Type 2 diabetes mellitus without complication, without long-term current use of insulin ([place]) ?A1c 5.9, goal  6-7.  Congratulated patient on her excellent efforts.  We will stop Jardiance today.  Continue metformin and Trulicity.  Follow-up A1c in 3 months. ? ?Health maintenance examination ?Obtained pap smear. Booked mammogram today. Discussed colonoscopy, declined today, pt will think about it and let me know. ?  ? ?Annual Examination  ?See AVS for age appropriate recommendations  ?PHQ score 0, reviewed and discussed.  ?BP reviewed and at goal.  ?Asked about intimate partner violence and resources given as appropriate  ?Advance directives discussion deferred ? ?Considered the following items based upon USPSTF recommendations: ?Diabetes screening: ordered ?Screening for elevated cholesterol: ordered ?HIV testing: ordered ?Hepatitis C: discussed ?Hepatitis B: ordered ?Syphilis if at high risk: discussed ?GC/CT not at high risk and not ordered. ?Osteoporosis screening considered based upon risk of fracture from [facility] calculator. Major osteoporotic fracture risk is 3.7%. DEXA not ordered.  ?Reviewed risk factors for latent tuberculosis and not indicated ? ?Discussed family history, BRCA testing not indicated.  ?Cervical cancer screening: due for Pap today, cytology alone ordered (HPV if ASCUS) ?Breast cancer screening: discussed potential benefits, risks including overdiagnosis and biopsy, elected proceed with mammogram ?Colorectal cancer screening: discussed and declined today.  ?Lung cancer screening: discussed. See documentation below regarding indications/risks/benefits. Ex-smoker for 40 years. ?Vaccinations up to date.  ? ?Follow up in 1 year or sooner if indicated.  ? ?Lattie Haw, MD ?[place]   ?

## 2021-08-19 ENCOUNTER — Encounter: Payer: Self-pay | Admitting: Family Medicine

## 2021-08-19 DIAGNOSIS — Z0000 Encounter for general adult medical examination without abnormal findings: Secondary | ICD-10-CM | POA: Insufficient documentation

## 2021-08-19 LAB — HIV ANTIBODY (ROUTINE TESTING W REFLEX): HIV Screen 4th Generation wRfx: NONREACTIVE

## 2021-08-19 LAB — LIPID PANEL
Chol/HDL Ratio: 2.8 ratio (ref 0.0–4.4)
Cholesterol, Total: 115 mg/dL (ref 100–199)
HDL: 41 mg/dL (ref >39)
LDL Chol Calc (NIH): 54 mg/dL (ref 0–99)
Triglycerides: 106 mg/dL (ref 0–149)
VLDL Cholesterol Cal: 20 mg/dL (ref 5–40)

## 2021-08-19 LAB — HEPATITIS B SURFACE ANTIGEN: Hepatitis B Surface Ag: NEGATIVE

## 2021-08-19 NOTE — Assessment & Plan Note (Addendum)
Obtained pap smear. Booked mammogram today. Discussed colonoscopy, declined today, pt will think about it and let me know. ?

## 2021-08-19 NOTE — Assessment & Plan Note (Signed)
A1c 5.9, goal 6-7.  Congratulated patient on her excellent efforts.  We will stop Jardiance today.  Continue metformin and Trulicity.  Follow-up A1c in 3 months. ?

## 2021-08-19 NOTE — Assessment & Plan Note (Signed)
BP at goal, continue lisinopril

## 2021-08-20 LAB — CYTOLOGY - PAP
Comment: NEGATIVE
Diagnosis: NEGATIVE
High risk HPV: NEGATIVE

## 2021-08-27 ENCOUNTER — Ambulatory Visit: Payer: Medicaid Other | Admitting: Pharmacist

## 2021-08-30 ENCOUNTER — Other Ambulatory Visit: Payer: Self-pay | Admitting: Family Medicine

## 2021-09-07 ENCOUNTER — Other Ambulatory Visit: Payer: Self-pay | Admitting: Family Medicine

## 2021-09-10 ENCOUNTER — Other Ambulatory Visit: Payer: Self-pay | Admitting: Family Medicine

## 2021-09-10 DIAGNOSIS — E119 Type 2 diabetes mellitus without complications: Secondary | ICD-10-CM

## 2021-09-15 ENCOUNTER — Encounter: Payer: Self-pay | Admitting: *Deleted

## 2021-09-17 ENCOUNTER — Ambulatory Visit (HOSPITAL_BASED_OUTPATIENT_CLINIC_OR_DEPARTMENT_OTHER): Payer: Medicaid Other | Admitting: Cardiology

## 2021-09-26 ENCOUNTER — Other Ambulatory Visit: Payer: Self-pay | Admitting: Family Medicine

## 2021-10-07 ENCOUNTER — Ambulatory Visit: Payer: Medicaid Other | Admitting: Family Medicine

## 2021-10-07 NOTE — Progress Notes (Deleted)
     SUBJECTIVE:   CHIEF COMPLAINT / HPI:   Teresa Chang is a 51 y.o. female presents for ***   ***  Flowsheet Row Office Visit from 08/18/2021 in [facility]  PHQ-9 Total Score 3        Health Maintenance Due  Topic   COLONOSCOPY (Pts 45-89yrs Insurance coverage will need to be confirmed)    MAMMOGRAM    Zoster Vaccines- Shingrix (1 of 2)   OPHTHALMOLOGY EXAM    COVID-19 Vaccine (3 - Pfizer series)      PERTINENT  PMH / PSH:   OBJECTIVE:   There were no vitals taken for this visit.   General: Alert, no acute distress Cardio: Normal S1 and S2, RRR, no r/m/g Pulm: CTAB, normal work of breathing Abdomen: Bowel sounds normal. Abdomen soft and non-tender.  Extremities: No peripheral edema.  Neuro: Cranial nerves grossly intact   ASSESSMENT/PLAN:   No problem-specific Assessment & Plan notes found for this encounter.    Towanda Octave, MD PGY-3 [facility]

## 2021-10-08 ENCOUNTER — Other Ambulatory Visit: Payer: Self-pay

## 2021-10-08 MED ORDER — FLUTICASONE PROPIONATE 50 MCG/ACT NA SUSP: 2.0000 | Freq: Every day | NASAL | 6 refills | Status: DC

## 2021-10-08 MED ORDER — LORATADINE 10 MG PO TABS: 10.0000 mg | ORAL_TABLET | Freq: Every day | ORAL | 2 refills | Status: DC

## 2021-10-19 DIAGNOSIS — L649 Androgenic alopecia, unspecified: Secondary | ICD-10-CM | POA: Diagnosis not present

## 2021-10-19 DIAGNOSIS — L658 Other specified nonscarring hair loss: Secondary | ICD-10-CM | POA: Diagnosis not present

## 2021-10-30 ENCOUNTER — Other Ambulatory Visit: Payer: Self-pay | Admitting: Family Medicine

## 2021-12-01 ENCOUNTER — Encounter: Payer: Self-pay | Admitting: Student

## 2021-12-01 ENCOUNTER — Ambulatory Visit (INDEPENDENT_AMBULATORY_CARE_PROVIDER_SITE_OTHER): Payer: Medicaid Other | Admitting: Student

## 2021-12-01 VITALS — BP 132/88 | HR 86 | Ht 67.0 in | Wt 202.6 lb

## 2021-12-01 DIAGNOSIS — I10 Essential (primary) hypertension: Secondary | ICD-10-CM

## 2021-12-01 DIAGNOSIS — M25432 Effusion, left wrist: Secondary | ICD-10-CM

## 2021-12-01 DIAGNOSIS — E119 Type 2 diabetes mellitus without complications: Secondary | ICD-10-CM | POA: Diagnosis not present

## 2021-12-01 LAB — POCT GLYCOSYLATED HEMOGLOBIN (HGB A1C): HbA1c, POC (controlled diabetic range): 6.5 % (ref 0.0–7.0)

## 2021-12-01 NOTE — Assessment & Plan Note (Signed)
A1c 6.5, still within goal Decrease Metformin to 500 mg once daily Continue Trulicity Encouraged to eat mid-morning snack F/u A1c in 3 months

## 2021-12-01 NOTE — Patient Instructions (Addendum)
It was great seeing you today.  We are going to get an X-ray of your left wrist due to the persistent swelling and redness.  I'd also like to collect some labs- please schedule a lab visit on your way out today for here at our clinic.  Decrease Metformin to 1 tablet daily (500mg  total).  We will plan to see you again next year unless you have any other concerns.   If you have any questions or concerns, please feel free to call the clinic.    Be well,  Dr. [facility] [phone]

## 2021-12-01 NOTE — Progress Notes (Cosign Needed)
    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  Low back pain and general aches Feels "bone aches" at times Also having low back pain that is fairly new No red flag sx's- bowel/bladder incontinence, saddle anesthesia, fevers/chills  PERTINENT  PMH / PSH: Reviewed  OBJECTIVE:   BP 132/88   Pulse 86   Ht 5\' 7"  (1.702 m)   Wt 202 lb 9.6 oz (91.9 kg)   LMP 11/10/2021   SpO2 100%   BMI 31.73 kg/m   General: Pleasant, well-appearing, no distress CV: RRR Resp: Normal WOB, no wheezes or crackles MSK: Left dorsal wrist with non-fluctuant area of swelling with overlying redness      ASSESSMENT/PLAN:   Type 2 diabetes mellitus without complication, without long-term current use of insulin (HCC) A1c 6.5, still within goal Decrease Metformin to 500 mg once daily Continue Trulicity Encouraged to eat mid-morning snack F/u A1c in 3 months  Swelling of left wrist Likely mixed picture of carpal tunnel syndrome with possible autoimmune condition given persistence of swelling/warmth/redness Given timeline, much less concerned for an acute condition such as cellulitis/osteomyelitis Will obtain X-ray of left wrist Referral to sports medicine for likely carpal tunnel without resolution w/ night splint Will also obtain RF, sed rate, anti- CCP ab as basic initial labs  Essential hypertension, benign BP initially elevated to 140s/90s, repeat at end of visit 130s/80s Continue Lisinopril daily     , DO [facility]

## 2021-12-01 NOTE — Assessment & Plan Note (Signed)
Likely mixed picture of carpal tunnel syndrome with possible autoimmune condition given persistence of swelling/warmth/redness Given timeline, much less concerned for an acute condition such as cellulitis/osteomyelitis Will obtain X-ray of left wrist Referral to sports medicine for likely carpal tunnel without resolution w/ night splint Will also obtain RF, sed rate, anti- CCP ab as basic initial labs

## 2021-12-02 ENCOUNTER — Telehealth: Payer: Self-pay | Admitting: Student

## 2021-12-02 ENCOUNTER — Other Ambulatory Visit: Payer: Self-pay | Admitting: Student

## 2021-12-02 DIAGNOSIS — M25432 Effusion, left wrist: Secondary | ICD-10-CM

## 2021-12-02 NOTE — Assessment & Plan Note (Signed)
BP initially elevated to 140s/90s, repeat at end of visit 130s/80s Continue Lisinopril daily

## 2021-12-02 NOTE — Telephone Encounter (Signed)
Erroneous encounter

## 2021-12-08 ENCOUNTER — Ambulatory Visit: Payer: Medicaid Other | Admitting: Sports Medicine

## 2021-12-08 ENCOUNTER — Ambulatory Visit (HOSPITAL_COMMUNITY)
Admission: RE | Admit: 2021-12-08 | Discharge: 2021-12-08 | Disposition: A | Discharge status: Home / Self Care | Payer: Medicaid Other | Source: Ambulatory Visit | Attending: Family Medicine | Admitting: Family Medicine

## 2021-12-08 ENCOUNTER — Ambulatory Visit: Payer: Self-pay

## 2021-12-08 VITALS — BP 157/93 | Ht 67.0 in | Wt 197.0 lb

## 2021-12-08 DIAGNOSIS — M7989 Other specified soft tissue disorders: Secondary | ICD-10-CM | POA: Diagnosis not present

## 2021-12-08 DIAGNOSIS — G5602 Carpal tunnel syndrome, left upper limb: Secondary | ICD-10-CM

## 2021-12-08 DIAGNOSIS — M25432 Effusion, left wrist: Secondary | ICD-10-CM

## 2021-12-08 NOTE — Progress Notes (Addendum)
SUBJECTIVE:   CHIEF COMPLAINT / HPI:   Left wrist swelling Teresa Chang is apleasant 51 year old woman who presents to sports medicine clinic today with a puzzling case of left wrist swelling and associated numbness and tingling in her hand.  Overall, she reports stable symptoms that are there every day but not worsening.  Swelling somewhat improved when wearing cock up wrist splint but never resolves completely.  No identified aggravating or relieving factors.  No previous injuries, grip abnormalities, or neurovascular compromise.  No new weakness. This started last fall and is now going on almost 1 year duration.  Short summary below.  No personal or family history of RA, gout, thyroid disorders, or other autoimmune disorders that she knows of.  12/15/2020 Urgent care visit.  Reported 3 weeks of waxing and waning swelling of the left wrist and elbow with hand numbness and tingling.  At the time of urgent care visit, identified a localized area of swelling on the extensor surface of the left wrist.  DVT ultrasound was negative.  Recommended anti-inflammatories, ice, elevation, and PCP follow-up.  12/22/2020 Seen by Dr. Leveda Anna at family medicine clinic for 2 to 3 weeks of progressive numbness and swelling of left hand and forearm.  No preceding trauma.  Diagnosed with carpal tunnel syndrome with caveat that this diagnosis was not a perfect fit.  Labs ordered include TSH, lipid panel, CMP although chart review shows they were never collected (unclear reason).  Additionally seen in family medicine clinic 03/09/2021 by Dr. Leveda Anna and again 08/18/2021 by Dr. Allena Katz; neither of these appointment notes indicate discussion about this left forearm swelling.  Most recently seen at the family medicine clinic 12/01/2021 by Dr. Melissa Noon.  Considered mixed picture of carpal tunnel syndrome with possible autoimmune condition.  Ordered left wrist x-ray, rheumatoid factor, sed rate, and anti-CCP; none of which have  been collected for what ever reason.  Point-of-care A1c 6.5.  PERTINENT  PMH / PSH: Left carpal tunnel syndrome, osteoarthritis, vitamin D deficiency, paraspinal muscle spasms, HLD, HTN, T2DM, HFrEF 50-55%  OBJECTIVE:   BP (!) 157/93   Ht 5\' 7"  (1.702 m)   Wt 197 lb (89.4 kg)   LMP 11/10/2021   BMI 30.85 kg/m    Left forearm Inspection reveals localized swelling over left distal radius Palpation reveals diffuse localized swelling, not well-circumscribed, nonpitting, nontender, nonerythematous, no additional localized warmth No tenderness to palpation over entirety of radius, ulna, all wrist bones and metacarpals Full range of motion Strength 5/5 and equal bilaterally of the shoulder, elbow, and wrist Painless resisted wrist flexion Painless resisted supination and pronation Finkelstein's negative  Ultrasound of the dorsal left wrist:  All 6 dorsal compartments were well visualized in short axis.  There is fluid surrounding the tendons in the fourth compartment proximally.  This is best seen in short axis view.  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  ASSESSMENT/PLAN:   Swelling of left wrist Unclear etiology, ultrasound today and sports med clinic demonstrates swelling within third and fourth extensor tendon sheaths.  No evidence of osseous abnormality, tendon tear or rupture, or ganglion cyst.  History and physical discounts soft tissue infection.  Could be rheumatologic, although odd location.  We will send patient for left wrist x-ray as planned.  [facility] for lab only appointment 8/31 at 4:15 PM.  Once x-ray is back, we will follow up with a phone call.  Depending on results, may  send for MRI.     Teresa Pho, MD [facility]   Patient seen and evaluated with the resident.  I agree with the above plan of care.  Etiology of the patient's swelling is  unclear at this time.  Ultrasound today does suggest that she is got diffuse swelling through the fourth extensor compartment of the left wrist.  X-rays as well as blood work to rule out rheumatologic causes of swelling have been previously ordered.  She is encouraged to proceed with those studies.  We did discuss trying a prescription strength oral anti-inflammatory but the patient decided against this.  I do think the wrist brace that she is wearing is helping and I encouraged her to continue with this while we await the results of her x-rays and blood work.  This note was dictated using Dragon naturally speaking software and may contain errors in syntax, spelling, or content which have not been identified prior to signing this note.

## 2021-12-08 NOTE — Patient Instructions (Signed)
Go for blood work at the family medicine clinic (where the normal PCP is located).  Your lab only appointment is for Thursday 8/31 at 4:15 PM.  Keep using your wrist splint for now.  Once your x-ray is done, we will call you with results and talk about next steps.

## 2021-12-08 NOTE — Assessment & Plan Note (Signed)
Unclear etiology, ultrasound today and sports med clinic demonstrates swelling within third and fourth extensor tendon sheaths.  No evidence of osseous abnormality, tendon tear or rupture, or ganglion cyst.  History and physical discounts soft tissue infection.  Could be rheumatologic, although odd location.  We will send patient for left wrist x-ray as planned.  [facility] for lab only appointment 8/31 at 4:15 PM.  Once x-ray is back, we will follow up with a phone call.  Depending on results, may send for MRI.

## 2021-12-10 ENCOUNTER — Other Ambulatory Visit: Payer: Medicaid Other

## 2021-12-10 DIAGNOSIS — G5602 Carpal tunnel syndrome, left upper limb: Secondary | ICD-10-CM | POA: Diagnosis not present

## 2021-12-10 DIAGNOSIS — M25432 Effusion, left wrist: Secondary | ICD-10-CM

## 2021-12-11 LAB — SEDIMENTATION RATE: Sed Rate: 8 mm/hr (ref 0–40)

## 2021-12-16 ENCOUNTER — Encounter: Payer: Self-pay | Admitting: *Deleted

## 2021-12-16 ENCOUNTER — Telehealth: Payer: Self-pay | Admitting: Sports Medicine

## 2021-12-16 ENCOUNTER — Other Ambulatory Visit: Payer: Self-pay | Admitting: *Deleted

## 2021-12-16 DIAGNOSIS — M25432 Effusion, left wrist: Secondary | ICD-10-CM

## 2021-12-16 LAB — RHEUMATOID FACTOR: Rheumatoid fact SerPl-aCnc: <10 IU/mL (ref <14.0)

## 2021-12-16 LAB — ANTI-CCP AB, IGG + IGA (RDL): Anti-CCP Ab, IgG + IgA (RDL): <20 Units (ref <20)

## 2021-12-16 NOTE — Progress Notes (Signed)
Mri ordered 

## 2021-12-16 NOTE — Telephone Encounter (Signed)
Please note that this is not a telephone note.  Reviewed the x-ray done of the left wrist.  It is unremarkable other than the dorsal swelling seen on ultrasound and on physical exam.  Blood work shows no obvious evidence of inflammatory arthropathy.  I recommend proceeding with an MRI of the left forearm to evaluate the soft tissue swelling further.

## 2021-12-22 ENCOUNTER — Other Ambulatory Visit: Payer: Self-pay | Admitting: Student

## 2021-12-23 ENCOUNTER — Ambulatory Visit
Admission: RE | Admit: 2021-12-23 | Discharge: 2021-12-23 | Disposition: A | Discharge status: Home / Self Care | Payer: Medicaid Other | Source: Ambulatory Visit | Attending: Sports Medicine | Admitting: Sports Medicine

## 2021-12-23 DIAGNOSIS — R2232 Localized swelling, mass and lump, left upper limb: Secondary | ICD-10-CM | POA: Diagnosis not present

## 2021-12-23 DIAGNOSIS — M25432 Effusion, left wrist: Secondary | ICD-10-CM

## 2021-12-25 ENCOUNTER — Telehealth: Payer: Self-pay

## 2021-12-25 NOTE — Telephone Encounter (Signed)
Patient Teresa Chang on nurse line requesting to speak with PCP regarding continued pain in hand and arm. She has been seeing sports medicine for this concern. She asked if Dr. Melissa Noon could go over the results from recent imaging.   Informed that these results went to Sports Medicine. She is requesting that Dr. Melissa Noon call her to discuss this concern as soon as possible.   Please return call to [phone].  Patient returned call to nurse line. She states that in her message she asked for Dr. Melissa Noon to call her directly and did not understand why I was calling her. Advised that when messages are left on the nurse line, the nurse will return call and then route accordingly. Patient verbalizes understanding and is still requesting to speak with PCP as soon as possible.   Veronda Prude, RN

## 2021-12-28 NOTE — Telephone Encounter (Signed)
Called patient to discuss ongoing wrist swelling/pain. She wanted to know the results of her MRI. I told her I would reach out to Dr. Micheline Chapman regarding next steps from their standpoint based on these results and then get back in touch with her.  Orvis Brill, DO

## 2021-12-28 NOTE — Telephone Encounter (Signed)
Patient Teresa Chang on nurse line upset PCP has not called her back.   Patient stated, "I only want to talk with her and not some sports medicine doctor."   Will forward to PCP.

## 2021-12-30 ENCOUNTER — Other Ambulatory Visit: Payer: Self-pay | Admitting: *Deleted

## 2021-12-30 ENCOUNTER — Encounter: Payer: Self-pay | Admitting: Sports Medicine

## 2021-12-30 ENCOUNTER — Telehealth: Payer: Self-pay | Admitting: Sports Medicine

## 2021-12-30 DIAGNOSIS — R200 Anesthesia of skin: Secondary | ICD-10-CM

## 2021-12-30 NOTE — Telephone Encounter (Signed)
  I spoke with Teresa Chang on the phone today about her MRI of her wrist and forearm.  It is normal but she is now endorsing some numbness and tingling that involves the arm and has been going on for better part of a year.  I recommended that we get an EMG/nerve conduction study to rule out cervical radiculopathy as well as possible carpal tunnel syndrome.  I will follow-up with her with those results when available.

## 2022-01-01 NOTE — Telephone Encounter (Signed)
Patient LVM on nurse line regarding previous concerns. She states that she contacted sports medicine (Dr. Micheline Chapman) and they are recommending nerve conduction testing. She is unsure if sports medicine will facilitate that or if our office needs to place referral.   She also states that PCP was reaching out to sports medicine. She states that when she spoke with Dr. Micheline Chapman that he informed her that he had not heard from our office yet.   Forwarding message to PCP for follow up. Please return call to patient at [phone].  Thanks.   Talbot Grumbling, RN

## 2022-01-04 ENCOUNTER — Telehealth: Payer: Self-pay | Admitting: Student

## 2022-01-04 NOTE — Telephone Encounter (Signed)
Patient LVM on nurse line regarding previous message. Patient is requesting to speak with supervising provider regarding care. She feels that care is "falling through the cracks."  She states that she works and that we can leave a voicemail on her phone if she is unable to answer.   She is requesting that nerve conduction testing be scheduled for Thursday afternoon after 12pm. Per chart review, referral has been placed by Sports Medicine.   Will forward to Dr. Gwendlyn Deutscher.   Talbot Grumbling, RN

## 2022-01-04 NOTE — Telephone Encounter (Signed)
Called patient on Friday with no answer and left HIPAA compliant voicemail to call our clinic to discuss her concerns. I called again today and left VM that Sports Medicine will be managing her wrist swelling as she has established care with them and had imaging ordered such as X-ray, MRI which were non-revealing other than soft tissue swelling.  Per chart review, for her tingling, numbness, swelling she has been referred to PM&R by Dr. Micheline Chapman. I advised patient to call our clinic tomorrow during business hours if she has any further questions or concerns.  Orvis Brill, DO

## 2022-01-05 NOTE — Telephone Encounter (Signed)
HIPAA compliant callback message left. 

## 2022-01-13 ENCOUNTER — Encounter: Payer: Self-pay | Admitting: Physical Medicine and Rehabilitation

## 2022-01-13 ENCOUNTER — Encounter
Payer: Medicaid Other | Attending: Physical Medicine and Rehabilitation | Admitting: Physical Medicine and Rehabilitation

## 2022-01-13 VITALS — BP 130/89 | HR 79 | Temp 98.8°F | Ht 67.0 in | Wt 207.0 lb

## 2022-01-13 DIAGNOSIS — G5602 Carpal tunnel syndrome, left upper limb: Secondary | ICD-10-CM | POA: Diagnosis not present

## 2022-01-13 NOTE — Progress Notes (Signed)
EMG LUE performed, results printed and scanned into chart. Please allow 2-3 days for processing.    Gertie Gowda, DO 01/13/2022

## 2022-01-22 ENCOUNTER — Encounter: Payer: Self-pay | Admitting: Sports Medicine

## 2022-01-25 ENCOUNTER — Ambulatory Visit: Payer: Medicaid Other | Admitting: Dermatology

## 2022-01-28 ENCOUNTER — Telehealth: Payer: Self-pay

## 2022-01-28 DIAGNOSIS — M25432 Effusion, left wrist: Secondary | ICD-10-CM

## 2022-01-28 NOTE — Telephone Encounter (Signed)
Patient calls nurse line requesting to speak with provider regarding nerve conduction results. Patient received update from Sports Medicine regarding results and was told to follow up with PCP.   Patient would like to know what the next steps are.   Will forward to Dr. Owens Shark.   Talbot Grumbling, RN

## 2022-01-28 NOTE — Telephone Encounter (Signed)
I called patient and discussed nerve conduction studies. Placed referral to rheumatology and explained that they will call to schedule an appointment with her.  Orvis Brill, DO.

## 2022-02-04 ENCOUNTER — Telehealth: Payer: Self-pay

## 2022-02-04 NOTE — Telephone Encounter (Signed)
Patient calls nurse line in regards to Rheumatology referral.  She reports she has not heard from anyone.   I gave patient the number to [facility].   Patient calls back reporting they are not accepting new patients until May 2024.  Will forward to [facility].

## 2022-02-16 ENCOUNTER — Ambulatory Visit: Payer: Medicaid Other | Admitting: Student

## 2022-02-18 ENCOUNTER — Ambulatory Visit (INDEPENDENT_AMBULATORY_CARE_PROVIDER_SITE_OTHER): Payer: Medicaid Other | Admitting: Student

## 2022-02-18 ENCOUNTER — Encounter: Payer: Self-pay | Admitting: Student

## 2022-02-18 VITALS — BP 126/82 | HR 88 | Ht 67.0 in | Wt 202.4 lb

## 2022-02-18 DIAGNOSIS — E119 Type 2 diabetes mellitus without complications: Secondary | ICD-10-CM | POA: Diagnosis not present

## 2022-02-18 DIAGNOSIS — J309 Allergic rhinitis, unspecified: Secondary | ICD-10-CM | POA: Diagnosis not present

## 2022-02-18 DIAGNOSIS — M25432 Effusion, left wrist: Secondary | ICD-10-CM

## 2022-02-18 LAB — POCT GLYCOSYLATED HEMOGLOBIN (HGB A1C): HbA1c, POC (controlled diabetic range): 7.4 % — AB (ref 0.0–7.0)

## 2022-02-18 MED ORDER — DICLOFENAC SODIUM 1 % EX GEL
CUTANEOUS | 0 refills | Status: DC
Start: 2022-02-18 — End: 2022-05-26

## 2022-02-18 NOTE — Assessment & Plan Note (Signed)
Congestion with rhinorrhea on exam today.  Afebrile. Possibly has viral URI in addition to underlying seasonal allergies. Provided with nasal saline rinse.  Also gave work note for today and tomorrow.

## 2022-02-18 NOTE — Assessment & Plan Note (Signed)
A1c 7.4%.  At goal. Continue Trulicity Continue metformin 500 mg once daily Follow-up A1c in 3 months, February 2024

## 2022-02-18 NOTE — Patient Instructions (Signed)
It was great seeing you today.  Use the diclofenac gel twice daily, up to 4 times daily, on your wrist. We are waiting for you to get in with rheumatology.  For your congestion, continue using the Flonase and use a saline nasal rinse I gave you.   If you have any questions or concerns, please feel free to call the clinic.    Be well,  Dr. Darral Dash [facility] [phone]

## 2022-02-18 NOTE — Assessment & Plan Note (Signed)
Etiology remains unclear.  Swelling has almost completely resolved, with normal MRI findings of left wrist in September. Given persistent pain, with fatigue, sent referral to rheumatology a month or so ago and awaiting appointment. Question if pain is related to carpal tunnel given description of shooting pain, pins-and-needles. Prescribed diclofenac gel today.

## 2022-02-18 NOTE — Progress Notes (Signed)
    SUBJECTIVE:   CHIEF COMPLAINT / HPI:   Teresa Chang is a 51 year old female here for follow-up of type 2 diabetes and left arm pain.  Allergies/congestion Worse for the past few days.  Complains of headache, fatigue, congestion. Has not been using anything for relief. Requesting work note for today and tomorrow because she says she does not feel well.  T2DM Trulicity weekly, Metformin 500 once daily. Due for A1c today  Left arm swelling/pain Says she still having "shooting pain up my arm" Wearing wrist brace today Initially referred to sports medicine.  In August and September had left wrist x-ray, ultrasound, and MRI (impression was normal examination, skin and subcutaneous soft tissues were within normal limits without mass or appreciable soft tissue swelling/edema). Also had nerve conduction studies which were within normal limits. Rheumatology referral sent, awaiting initial consultation.  PERTINENT  PMH / PSH: Reviewed  OBJECTIVE:   BP 126/82   Pulse 88   Ht 5\' 7"  (1.702 m)   Wt 202 lb 6.4 oz (91.8 kg)   LMP 02/02/2022   SpO2 97%   BMI 31.70 kg/m  General: Alert, awake, wearing mask. HEENT: Sinus and nasal congestion with rhinorrhea.  Mucous membranes moist. CV: Regular rate and rhythm Respiratory: Normal work of breathing on room air, lungs clear. MSK, left wrist: Significantly improved swelling of left wrist from prior exam months ago (almost completely resolved).  No overlying erythema, warmth, tenderness to palpation.  Normal range of motion.   ASSESSMENT/PLAN:   Swelling of left wrist Etiology remains unclear.  Swelling has almost completely resolved, with normal MRI findings of left wrist in September. Given persistent pain, with fatigue, sent referral to rheumatology a month or so ago and awaiting appointment. Question if pain is related to carpal tunnel given description of shooting pain, pins-and-needles. Prescribed diclofenac gel today.  Type 2  diabetes mellitus without complication, without long-term current use of insulin (HCC) A1c 7.4%.  At goal. Continue Trulicity Continue metformin 500 mg once daily Follow-up A1c in 3 months, February 2024  Allergic rhinitis Congestion with rhinorrhea on exam today.  Afebrile. Possibly has viral URI in addition to underlying seasonal allergies. Provided with nasal saline rinse.  Also gave work note for today and tomorrow.     March 2024, DO [facility]

## 2022-03-17 ENCOUNTER — Other Ambulatory Visit: Payer: Self-pay | Admitting: Student

## 2022-03-18 ENCOUNTER — Other Ambulatory Visit: Payer: Self-pay | Admitting: Family Medicine

## 2022-03-18 DIAGNOSIS — E559 Vitamin D deficiency, unspecified: Secondary | ICD-10-CM

## 2022-03-19 DIAGNOSIS — R202 Paresthesia of skin: Secondary | ICD-10-CM | POA: Diagnosis not present

## 2022-03-19 DIAGNOSIS — R200 Anesthesia of skin: Secondary | ICD-10-CM | POA: Diagnosis not present

## 2022-03-19 DIAGNOSIS — M542 Cervicalgia: Secondary | ICD-10-CM | POA: Diagnosis not present

## 2022-03-19 DIAGNOSIS — M5412 Radiculopathy, cervical region: Secondary | ICD-10-CM | POA: Diagnosis not present

## 2022-03-28 ENCOUNTER — Other Ambulatory Visit: Payer: Self-pay | Admitting: Family Medicine

## 2022-03-28 DIAGNOSIS — I10 Essential (primary) hypertension: Secondary | ICD-10-CM

## 2022-03-28 DIAGNOSIS — E119 Type 2 diabetes mellitus without complications: Secondary | ICD-10-CM

## 2022-04-20 ENCOUNTER — Other Ambulatory Visit: Payer: Self-pay

## 2022-04-20 ENCOUNTER — Ambulatory Visit (INDEPENDENT_AMBULATORY_CARE_PROVIDER_SITE_OTHER): Payer: Medicaid Other | Admitting: Family Medicine

## 2022-04-20 ENCOUNTER — Encounter: Payer: Self-pay | Admitting: Family Medicine

## 2022-04-20 VITALS — BP 142/96 | HR 110 | Wt 205.6 lb

## 2022-04-20 DIAGNOSIS — E119 Type 2 diabetes mellitus without complications: Secondary | ICD-10-CM

## 2022-04-20 DIAGNOSIS — R350 Frequency of micturition: Secondary | ICD-10-CM

## 2022-04-20 DIAGNOSIS — M5412 Radiculopathy, cervical region: Secondary | ICD-10-CM | POA: Diagnosis not present

## 2022-04-20 LAB — POCT URINALYSIS DIP (MANUAL ENTRY)
Bilirubin, UA: NEGATIVE
Blood, UA: NEGATIVE
Glucose, UA: >=1000 mg/dL — AB
Ketones, POC UA: NEGATIVE mg/dL
Leukocytes, UA: NEGATIVE
Nitrite, UA: NEGATIVE
Protein Ur, POC: NEGATIVE mg/dL
Spec Grav, UA: 1.01 (ref 1.010–1.025)
Urobilinogen, UA: 0.2 E.U./dL
pH, UA: 6.5 (ref 5.0–8.0)

## 2022-04-20 LAB — POCT GLYCOSYLATED HEMOGLOBIN (HGB A1C): HbA1c, POC (controlled diabetic range): 9.1 % — AB (ref 0.0–7.0)

## 2022-04-20 LAB — POCT UA - MICROSCOPIC ONLY
RBC, Urine, Miroscopic: NONE SEEN (ref 0–2)
WBC, Ur, HPF, POC: NONE SEEN (ref 0–5)

## 2022-04-20 LAB — GLUCOSE, POCT (MANUAL RESULT ENTRY): POC Glucose: 434 mg/dl — AB (ref 70–99)

## 2022-04-20 MED ORDER — TRULICITY 0.75 MG/0.5ML ~~LOC~~ SOAJ
SUBCUTANEOUS | 1 refills | Status: DC
Start: 2022-04-20 — End: 2022-05-26

## 2022-04-20 MED ORDER — ACCU-CHEK GUIDE W/DEVICE KIT: PACK | 0 refills | Status: DC

## 2022-04-20 NOTE — Progress Notes (Signed)
    SUBJECTIVE:   CHIEF COMPLAINT / HPI:   Urinary Frequency Going more often with a vague fullness in R lower back No burning with urination or vaginal discharge or fever  Unable to check her blood sugar as does not have meter  Diabetes Knows her medications.  Taking metformin only one per day but medication list is 2 tabs twice a day.  Trulicity weekly    PERTINENT  PMH / PSH: History of microscopic hematuria, Diastolic HF, DM2, Bipolar, Anxiety Hypertension   OBJECTIVE:   BP (!) 142/96   Pulse (!) 110   Wt 205 lb 9.6 oz (93.3 kg)   SpO2 100%   BMI 32.20 kg/m   Alert no distress No CVAT  No SLR pain Normal gait Back is not tender to palpation No rash   UA noted - no signs of infection or hematuria   ASSESSMENT/PLAN:   Urinary frequency -     POCT urinalysis dipstick -     POCT UA - Microscopic Only  Type 2 diabetes mellitus without complication, without long-term current use of insulin ([place]) Assessment & Plan: Not well controlled.  She confessed to some dietary indiscretions on a holiday cruise and missing a few days with her trulicity.  Decided to increase metformin and to will send in a Rx for a meter.   Should follow up with PCP in Feb to ensure improvement    Orders: -     POCT glycosylated hemoglobin (Hb A1C) -     POCT glucose (manual entry) -     Accu-Chek Guide; Use once daily  Dispense: 1 kit; Refill: 0  Other orders -     Trulicity; ADMINISTER 2.29 MG UNDER THE SKIN 1 TIME A WEEK  Dispense: 6 mL; Refill: 1     Patient Instructions  Good to see you today - Thank you for coming in  Things we discussed today:  Urine This seems to be related to your diabetes.  No signs of infection   Diabetes Take metformin one tab twice a day if in one week you are still urinatiing a lot or if your fasting blood sugar is > 150 then take 2 tabs twice a day   Nerve Conduction Study Ask your neurologist who did the NCS for a copy and send to [facility]   Make an  appointment for a Physical with your PCP to discuss keeping you healthy Health Maintenance Due  Topic Date Due   COLONOSCOPY (Pts 45-23yrs Insurance coverage will need to be confirmed)  Never done   MAMMOGRAM  Never done   Zoster Vaccines- Shingrix (1 of 2) Never done   OPHTHALMOLOGY EXAM  06/12/2019   Diabetic kidney evaluation - Urine ACR  02/28/2020   INFLUENZA VACCINE  11/10/2021   COVID-19 Vaccine (3 - 2023-24 season) 12/11/2021   Diabetic kidney evaluation - eGFR measurement  03/09/2022   FOOT EXAM  03/09/2022   Please always bring your medication bottles  See her in February       Lind Covert, MD [place]

## 2022-04-20 NOTE — Patient Instructions (Addendum)
Good to see you today - Thank you for coming in  Things we discussed today:  Urine This seems to be related to your diabetes.  No signs of infection   Diabetes Take metformin one tab twice a day if in one week you are still urinatiing a lot or if your fasting blood sugar is > 150 then take 2 tabs twice a day   Nerve Conduction Study Ask your neurologist who did the NCS for a copy and send to [facility]   Make an appointment for a Physical with your PCP to discuss keeping you healthy Health Maintenance Due  Topic Date Due   COLONOSCOPY (Pts 45-35yrs Insurance coverage will need to be confirmed)  Never done   MAMMOGRAM  Never done   Zoster Vaccines- Shingrix (1 of 2) Never done   OPHTHALMOLOGY EXAM  06/12/2019   Diabetic kidney evaluation - Urine ACR  02/28/2020   INFLUENZA VACCINE  11/10/2021   COVID-19 Vaccine (3 - 2023-24 season) 12/11/2021   Diabetic kidney evaluation - eGFR measurement  03/09/2022   FOOT EXAM  03/09/2022   Please always bring your medication bottles  See her in February

## 2022-04-20 NOTE — Assessment & Plan Note (Signed)
Not well controlled.  She confessed to some dietary indiscretions on a holiday cruise and missing a few days with her trulicity.  Decided to increase metformin and to will send in a Rx for a meter.   Should follow up with PCP in Feb to ensure improvement

## 2022-04-22 ENCOUNTER — Telehealth: Payer: Self-pay

## 2022-04-22 NOTE — Telephone Encounter (Signed)
Patient calls nurse line regarding concerns with visit note from 04/20/22. She would like for the diagnoses of bipolar, depression, anxiety and HTN to be removed from her chart. She states that she was only having mental health concerns while she was going through divorce 10 years ago. She states that she also no longer has hypertension.   Will forward to PCP for further assistance for this matter.   Talbot Grumbling, RN

## 2022-05-10 ENCOUNTER — Telehealth: Payer: Self-pay

## 2022-05-10 NOTE — Telephone Encounter (Signed)
A Prior Authorization was initiated for this patients TRULICITY through CoverMyMeds.   Key: [id number]

## 2022-05-11 DIAGNOSIS — M5412 Radiculopathy, cervical region: Secondary | ICD-10-CM | POA: Diagnosis not present

## 2022-05-11 DIAGNOSIS — R202 Paresthesia of skin: Secondary | ICD-10-CM | POA: Diagnosis not present

## 2022-05-11 DIAGNOSIS — R200 Anesthesia of skin: Secondary | ICD-10-CM | POA: Diagnosis not present

## 2022-05-13 NOTE — Telephone Encounter (Signed)
Prior Auth for patients medication TRULICITY approved by [employer] from 05/10/22 to 05/11/23.  CoverMyMeds Key: [id number] [place] Case ID #:  [id number]

## 2022-05-14 ENCOUNTER — Ambulatory Visit: Payer: Medicaid Other | Admitting: Student

## 2022-05-20 NOTE — Telephone Encounter (Signed)
Patient returns call to nurse line regarding this issue.   Please advise.   Talbot Grumbling, RN

## 2022-05-21 NOTE — Telephone Encounter (Addendum)
Per Dr Owens Shark, pt informed the issues have been resolved in pt chart. Ottis Stain, CMA

## 2022-05-24 DIAGNOSIS — R202 Paresthesia of skin: Secondary | ICD-10-CM | POA: Diagnosis not present

## 2022-05-24 DIAGNOSIS — R200 Anesthesia of skin: Secondary | ICD-10-CM | POA: Diagnosis not present

## 2022-05-24 DIAGNOSIS — M5412 Radiculopathy, cervical region: Secondary | ICD-10-CM | POA: Diagnosis not present

## 2022-05-24 DIAGNOSIS — R29898 Other symptoms and signs involving the musculoskeletal system: Secondary | ICD-10-CM | POA: Diagnosis not present

## 2022-05-26 ENCOUNTER — Ambulatory Visit (INDEPENDENT_AMBULATORY_CARE_PROVIDER_SITE_OTHER): Payer: Medicaid Other | Admitting: Pharmacist

## 2022-05-26 ENCOUNTER — Encounter: Payer: Self-pay | Admitting: Pharmacist

## 2022-05-26 VITALS — BP 127/81 | HR 98 | Wt 195.5 lb

## 2022-05-26 DIAGNOSIS — E119 Type 2 diabetes mellitus without complications: Secondary | ICD-10-CM | POA: Diagnosis not present

## 2022-05-26 LAB — POCT GLYCOSYLATED HEMOGLOBIN (HGB A1C): HbA1c, POC (controlled diabetic range): 9.5 % — AB (ref 0.0–7.0)

## 2022-05-26 MED ORDER — TRULICITY 1.5 MG/0.5ML ~~LOC~~ SOAJ: 1.5000 mg | SUBCUTANEOUS | 3 refills | Status: DC

## 2022-05-26 NOTE — Patient Instructions (Addendum)
[name] it was a pleasure seeing you today.   Medication Changes:  Increase Trulicity (dulaglutide) to 1.5 mg subcutaneously once weekly  Inject two 0.75 mg pens (1.5 mg) at the same time once weekly until home supply is depleted    The sensor is small waterproof disc that is placed on the back of the upper arm.  There is a very thin filament that is inserted under the surface of the skin and measures the amount of glucose in the interstitial fluid.  This system collects your sugar levels for up to 14 days and it automatically records the glucose level every 15 minutes. This will show your provider any patterns in your glucose levels.  Please remember... 1. Sensor will last 14 days 2. Sensor should be applied to area away from scarring, tattoos, irritation, and bones. 3. Starting the sensor: 1 hour warm up before BG readings available   4. Hold reader within 1.5 inches of sensor to scan 6. When the blood drop and magnifying glass symbol appears, test fingerstick blood glucose prior to making treatment decisions 7. Do a fingerstick blood glucose test if the sensor readings do not match how you feel 8. Remove sensor prior to magnetic resonance imaging (MRI), computed tomography (CT) scan, or high-frequency electrical heat (diathermy) treatment. 9. Freestyle Libre may be worn through a Environmental education officer. It may not be exposed to an advanced Imaging Technology (AIT) body scanner (also called a millimeter wave scanner) or the baggage x-ray machine. Instead, ask for hand-wanding or full-body pat-down and visual inspection.  10. Doses of vitamin C (ascorbic acid) >500 mg every day may cause false high readings. 11. Do not submerge more than 3 feet or keep underwater longer than 30 minutes at a time. Gently pat to dry.  12. Store sensor kit between 39 and 77 degrees Farenheit. Can be refrigerated within this temperature range.  Problems with Freestyle Libre sticking? 1. Order Tegaderm  I.V. films to place directly over Teresa Chang sensor on arm. 2. May also order Skin Tac from Teresa Chang. Alcohol swab area you plan to administer Freestyle Libre then let dry. Once dry, apply Skin Tac in a circular motion (with a spot in the middle for sensor without skin tac) and let dry. Once dry you can apply Freestyle Libre   Problems taking off Freestyle Teresa Chang? 1. Remember to try to shower before removing Freestyle Libre 2. Order Tac Away to help remove any extra adhesive left on your skin once you remove Freestyle Libre 3. May also try baby oil to loosen adhesive  Teresa Chang Phone number: [phone] Available 7 days a week; excluding holidays 8 AM to 8PM EST  [email or web address]  Please do the following:  Increase Trulicity (dulaglutide) to 1.5 mg subcutaneously once weekly as directed today during your appointment. If you have any questions or if you believe something has occurred because of this change, call me or your doctor to let one of [place] know.  Continue checking blood sugars at home. It's really important that you record these and bring these in to your next doctor's appointment.  Continue making the lifestyle changes we've discussed together during our visit. Diet and exercise play a significant role in improving your blood sugars.  Follow-up with me/PCP in 4-6 weeks.   Hypoglycemia or low blood sugar:   Low blood sugar can happen quickly and may become an emergency if not treated right away.   While this shouldn't happen often, it can be brought  upon if you skip a meal or do not eat enough. Also, if your insulin or other diabetes medications are dosed too high, this can cause your blood sugar to go to low.   Warning signs of low blood sugar include: Feeling shaky or dizzy Feeling weak or tired  Excessive hunger Feeling anxious or upset  Sweating even when you aren't exercising  What to do if I experience low blood sugar? Follow the Rule of  15 Check your blood sugar. If lower than 70, proceed to step 2.  Treat with 15 grams of fast acting carbs which is found in 3-4 glucose tablets. If none are available you can try hard candy, 1 tablespoon of sugar or honey,4 ounces of fruit juice, or 6 ounces of REGULAR soda.  Re-check your sugar in 15 minutes. If it is still below 70, do what you did in step 2 again. If your blood sugar has come back up, go ahead and eat a snack or small meal made up of complex carbs (ex. Whole grains) and protein at this time to avoid recurrence of low blood sugar.

## 2022-05-26 NOTE — Progress Notes (Signed)
S:     Chief Complaint  Patient presents with   Medication Management    DM follow-up   52 y.o. female who presents for diabetes evaluation, education, and management in the context of the LIBERATE Study.   PMH is significant for 123456, diastolic HF, HTN, HLD.    Patient was referred and last seen by Primary Care Provider, Dr. Erin Hearing, on 04/20/2022.  At last visit, A1c was elevated at 9.1.  Today, patient arrives in good spirits and presents without any assistance.   Patient reports Diabetes was diagnosed in 03/2018.   Current diabetes medications include: Trulicity (dulaglutide) 0.75 mg subcutaneous injection once weekly, metformin 500 mg 24 hr tablet 2 tablets (1000 mg) twice daily Current hypertension medications include: lisinopril 20 mg daily  Current hyperlipidemia medications include: rosuvastatin (Crestor) 20 mg daily   Patient reports adherence to taking all medications as prescribed. Patient reports going on a cruise in December and eating outside her normal dietary habits. Patient's 90-day blood glucose glucometer was 218.   Have you been experiencing any side effects to the medications prescribed? no Do you have any problems obtaining medications due to transportation or finances? no Insurance coverage: Medicaid  Patient denies hypoglycemic events.  O:   Review of Systems  All other systems reviewed and are negative.   Physical Exam Constitutional:      Appearance: Normal appearance.  Pulmonary:     Effort: Pulmonary effort is normal.     Breath sounds: Normal breath sounds.  Neurological:     Mental Status: She is alert.  Psychiatric:        Mood and Affect: Mood normal.        Behavior: Behavior normal.        Thought Content: Thought content normal.      Lab Results  Component Value Date   HGBA1C 9.5 (A) 05/26/2022    Vitals:   05/26/22 0957  BP: 127/81  Pulse: 98  SpO2: 100%     Lipid Panel     Component Value Date/Time   CHOL  115 08/18/2021 1518   TRIG 106 08/18/2021 1518   HDL 41 08/18/2021 1518   CHOLHDL 2.8 08/18/2021 1518   CHOLHDL 4.3 05/25/2016 1620   VLDL 39 (H) 05/25/2016 1620   LDLCALC 54 08/18/2021 1518    Clinical Atherosclerotic Cardiovascular Disease (ASCVD): Yes  The ASCVD Risk score (Arnett DK, et al., 2019) failed to calculate for the following reasons:   The valid total cholesterol range is 130 to 320 mg/dL   Patient is participating in a Managed Medicaid Plan:  Yes   A/P:  LIBERATE Study:  - Provided education on Libre 3 CGM. Collaborated to ensure Elenor Legato 3 app was downloaded on patient's phone. Educated on how to place sensor every 14 days, patient placed first sensor correctly and verbalized understanding of use, removal, and how to place next sensor. Discussed alarms. 8 sensors provided for a 3 month supply. Educated to contact the office if the sensor falls off early and replacements are needed before their next [employer].   Diabetes longstanding currently uncontrolled based on elevated A1c of 9.5 today. Patient is able to verbalize appropriate hypoglycemia management plan. Medication adherence appears good. Control is suboptimal due to recent vacation in December. -Increased dose of GLP-1 Trulicity (generic dulaglutide) to 1.5 mg subcutaneously once weekly. Advised to double up on 0.75 mg pens until current stock depleted (~2 boxes).  -Continued metformin 1000 mg twice daily.  -Consider  addition of SGLT-2 inhibitor in the future.  -Obtain UACR during next visit.  -Patient educated on purpose and proper use of Libre 3 CGM. -Extensively discussed pathophysiology of diabetes, recommended lifestyle interventions, dietary effects on blood sugar control.  -Counseled on s/sx of and management of hypoglycemia.  -Next A1c anticipated 3 months.   ASCVD risk - secondary prevention in patient with diabetes. Last LDL is 54 at goal of <70 mg/dL. high intensity statin indicated.  -Continued  Rosuvastatin (Crestor) 20 mg.   Hypertension longstanding currently controlled. Blood pressure goal of <130/80 mmHg. Medication adherence good.  -Continued lisinopril 20 mg daily   Written patient instructions provided. Patient verbalized understanding of treatment plan.  Total time in face to face counseling 45 minutes.    Follow-up:  Pharmacist follow-up on 07/01/2022. Patient seen with Francena Hanly, PharmD PGY-1 Pharmacy Resident and Dixon Boos, PharmD Candidate.

## 2022-05-26 NOTE — Assessment & Plan Note (Signed)
LIBERATE Study:  - Provided education on Libre 3 CGM. Collaborated to ensure Elenor Legato 3 app was downloaded on patient's phone. Educated on how to place sensor every 14 days, patient placed first sensor correctly and verbalized understanding of use, removal, and how to place next sensor. Discussed alarms. 8 sensors provided for a 3 month supply. Educated to contact the office if the sensor falls off early and replacements are needed before their next [employer].   Diabetes longstanding currently uncontrolled based on elevated A1c of 9.5 today. Patient is able to verbalize appropriate hypoglycemia management plan. Medication adherence appears good. Control is suboptimal due to recent vacation in December. -Increased dose of GLP-1 Trulicity (generic dulaglutide) to 1.5 mg subcutaneously once weekly. Advised to double up on 0.75 mg pens until current stock depleted (~2 boxes).  -Continued metformin 1000 mg twice daily.  -Consider addition of SGLT-2 inhibitor in the future.  -Obtain UACR during next visit.  -Patient educated on purpose and proper use of Libre 3 CGM. -Extensively discussed pathophysiology of diabetes, recommended lifestyle interventions, dietary effects on blood sugar control.  -Counseled on s/sx of and management of hypoglycemia.  -Next A1c anticipated 3 months.

## 2022-05-26 NOTE — Progress Notes (Signed)
Reviewed: I agree with Dr. Koval's documentation and management. 

## 2022-05-31 ENCOUNTER — Telehealth: Payer: Self-pay

## 2022-05-31 NOTE — Telephone Encounter (Signed)
Called patient and informed that letter is ready for pick up.   Talbot Grumbling, RN

## 2022-05-31 NOTE — Telephone Encounter (Signed)
Patient calls nurse line requesting to speak with Dr. Valentina Lucks regarding accommodations for work. Patient is currently enrolled in the [employer] study for diabetes management.Patient states that she is currently scheduled the eat lunch at 11 and that she feels like she would benefit from having a later lunch, given that she finishes breakfast at 9 am.   Her job states that patient can have later lunch time if provider provides accomodation letter stating the need/benefit of later lunch time. Patient would like to have lunch between 1-1:30 pm.   Will forward to PCP and Dr. Valentina Lucks.   Talbot Grumbling, RN

## 2022-06-03 ENCOUNTER — Telehealth: Payer: Self-pay

## 2022-06-03 NOTE — Telephone Encounter (Signed)
Attempted to contact patient for 7 day telephone follow up regarding LIBERATE study, as she was enrolled last week.   Patient did not answer and a HIPAA compliant VM was left requesting a return call.   Joseph Art, Pharm.D. PGY-2 Ambulatory Care Pharmacy Resident

## 2022-06-11 DIAGNOSIS — R202 Paresthesia of skin: Secondary | ICD-10-CM | POA: Diagnosis not present

## 2022-06-11 DIAGNOSIS — M5412 Radiculopathy, cervical region: Secondary | ICD-10-CM | POA: Diagnosis not present

## 2022-06-11 DIAGNOSIS — R29898 Other symptoms and signs involving the musculoskeletal system: Secondary | ICD-10-CM | POA: Diagnosis not present

## 2022-06-11 DIAGNOSIS — R200 Anesthesia of skin: Secondary | ICD-10-CM | POA: Diagnosis not present

## 2022-06-22 ENCOUNTER — Telehealth: Payer: Self-pay | Admitting: Pharmacist

## 2022-06-22 DIAGNOSIS — E119 Type 2 diabetes mellitus without complications: Secondary | ICD-10-CM

## 2022-06-22 MED ORDER — TRULICITY 1.5 MG/0.5ML ~~LOC~~ SOAJ
1.5000 mg | SUBCUTANEOUS | 3 refills | Status: DC
  Filled 2022-06-22: qty 2, 28d supply, fill #0
  Filled 2022-07-17: qty 2, 28d supply, fill #1

## 2022-06-22 NOTE — Telephone Encounter (Signed)
Patient called stating that her pharmacy [facility]) did not have supply of Trulicity at next planned dose 1.'5mg'$  weekly.   Requested alternatives.   [facility] and determined they had supply of this medication.   Contacted patient, instructed her on plan to use [facility] pharmacy.  Shared that they would process her insurance when she arrived.   She verbalized understanding of treatment plan.   Reports hre CGM was functioning and then fell off yesterday.  Plan to replace it today.  Reports that it is working well.   Next follow-up planned next wee 3/21

## 2022-06-23 ENCOUNTER — Other Ambulatory Visit (HOSPITAL_COMMUNITY): Payer: Self-pay

## 2022-06-24 NOTE — Telephone Encounter (Signed)
Reviewed and agree with Dr Koval's plan.   

## 2022-07-01 ENCOUNTER — Ambulatory Visit (INDEPENDENT_AMBULATORY_CARE_PROVIDER_SITE_OTHER): Payer: Medicaid Other | Admitting: Pharmacist

## 2022-07-01 ENCOUNTER — Other Ambulatory Visit: Payer: Self-pay

## 2022-07-01 ENCOUNTER — Other Ambulatory Visit (HOSPITAL_COMMUNITY): Payer: Self-pay

## 2022-07-01 VITALS — BP 135/86 | HR 83 | Wt 198.6 lb

## 2022-07-01 DIAGNOSIS — M5412 Radiculopathy, cervical region: Secondary | ICD-10-CM | POA: Diagnosis not present

## 2022-07-01 DIAGNOSIS — E119 Type 2 diabetes mellitus without complications: Secondary | ICD-10-CM

## 2022-07-01 DIAGNOSIS — I10 Essential (primary) hypertension: Secondary | ICD-10-CM | POA: Diagnosis not present

## 2022-07-01 DIAGNOSIS — R29898 Other symptoms and signs involving the musculoskeletal system: Secondary | ICD-10-CM | POA: Diagnosis not present

## 2022-07-01 MED ORDER — OLMESARTAN MEDOXOMIL-HCTZ 20-12.5 MG PO TABS
1.0000 | ORAL_TABLET | Freq: Every day | ORAL | 3 refills | Status: DC
  Filled 2022-07-01 – 2022-07-17 (×2): qty 90, 90d supply, fill #0

## 2022-07-01 NOTE — Patient Instructions (Addendum)
It was nice to see you today!  Your goal blood sugar is 80-130 before eating and less than 180 after eating.  Medication Changes: Continue Trulicity 1.5 mg weekly and metformin  Stop taking lisinopril. Start olmesartan-HCTZ 20-12.5 mg once daily  Monitor blood sugars at home and keep a log (glucometer or piece of paper) to bring with you to your next visit.  Keep up the good work with diet and exercise. Aim for a diet full of vegetables, fruit and lean meats (chicken, [place], fish). Try to limit salt intake by eating fresh or frozen vegetables (instead of canned), rinse canned vegetables prior to cooking and do not add any additional salt to meals.

## 2022-07-01 NOTE — Assessment & Plan Note (Signed)
Diabetes longstanding currently uncontrolled, but improving based on CGM data. Patient is able to verbalize appropriate hypoglycemia management plan. Medication adherence appears good.  -Continued GLP-1 Trulicity (dulaglutide) to 1.5 mg once weekly. -Continued metformin 1000 mg twice daily.  -Patient previously on Jardiance but was discontinued in May 2023 due to A1c being at goal.  -Obtain UACR at next PCP visit.  Consider SGLT2i at follow up for renal/cardiovascular protection.  -Extensively discussed pathophysiology of diabetes, recommended lifestyle interventions, dietary effects on blood sugar control.  -Counseled on s/sx of and management of hypoglycemia.  -Next A1c anticipated 2 months.

## 2022-07-01 NOTE — Progress Notes (Signed)
S:    Chief Complaint  Patient presents with   Medication Management    DM, LIBERATE    52 y.o. female who presents for diabetes evaluation, education, and management in the context of the LIBERATE Study.   PMH is significant for 123456, diastolic HF, HTN, HLD.    Patient was referred and last seen by Primary Care Provider, Dr. Erin Hearing, on 04/20/2022. Last seen by pharmacy clinic on 05/26/2022 where she was enrolled in the LIBERATE study. Trulicity (dulaglutide) was also increased to 1.5 mg weekly.   Today, patient arrives in good spirits and presents without any assistance. Patient had her first dose of Trulicity 1.5 mg yesterday and has tolerated it without issues.   Patient reports Diabetes was diagnosed in 03/2018.   Current diabetes medications include: Trulicity (dulaglutide) 1.5 mg once weekly (first dose was yesterday), metformin 500 mg BID (prescribed as 1000 mg BID) Current hypertension medications include: lisinopril 20 mg daily  Current hyperlipidemia medications include: rosuvastatin (Crestor) 20 mg daily   Patient reports adherence to taking all medications as prescribed, other than metformin.  Have you been experiencing any side effects to the medications prescribed? no Do you have any problems obtaining medications due to transportation or finances? no Insurance coverage: Medicaid  Patient denies hypoglycemic events.  O:  Date of Download: 07/01/2022 % Time CGM is active: 88% Average Glucose: 169 mg/dL Glucose Management Indicator: 7.4%  Glucose Variability: 24.6 (goal <36%) Time in Goal:  - Time in range 70-180: 66% - Time above range: 34% - Time below range: 0%   Review of Systems  All other systems reviewed and are negative.   Physical Exam Constitutional:      Appearance: Normal appearance.  Pulmonary:     Effort: Pulmonary effort is normal.     Breath sounds: Normal breath sounds.  Neurological:     Mental Status: She is alert.  Psychiatric:         Mood and Affect: Mood normal.        Behavior: Behavior normal.        Thought Content: Thought content normal.     Lab Results  Component Value Date   HGBA1C 9.5 (A) 05/26/2022    Vitals:   07/01/22 1348 07/01/22 1349  BP: (!) 134/93 135/86  Pulse: 83   SpO2: 100%      Lipid Panel     Component Value Date/Time   CHOL 115 08/18/2021 1518   TRIG 106 08/18/2021 1518   HDL 41 08/18/2021 1518   CHOLHDL 2.8 08/18/2021 1518   CHOLHDL 4.3 05/25/2016 1620   VLDL 39 (H) 05/25/2016 1620   LDLCALC 54 08/18/2021 1518    Clinical Atherosclerotic Cardiovascular Disease (ASCVD): Yes   Patient is participating in a Managed Medicaid Plan:  Yes   A/P: Diabetes longstanding currently uncontrolled, but improving based on CGM data. Patient is able to verbalize appropriate hypoglycemia management plan. Medication adherence appears good.  -Continued GLP-1 Trulicity (dulaglutide) to 1.5 mg once weekly. -Continued metformin 1000 mg twice daily.  -Patient previously on Jardiance but was discontinued in May 2023 due to A1c being at goal.  -Obtain UACR at next PCP visit.  Consider SGLT2i at follow up for renal/cardiovascular protection.  -Extensively discussed pathophysiology of diabetes, recommended lifestyle interventions, dietary effects on blood sugar control.  -Counseled on s/sx of and management of hypoglycemia.  -Next A1c anticipated 2 months.   ASCVD risk - secondary prevention in patient with diabetes. Last LDL is 54  at goal of <70 mg/dL. high intensity statin indicated.  -Continued Rosuvastatin (Crestor) 20 mg.   Hypertension longstanding currently slightly above goal in clinic. Blood pressure goal of <130/80 mmHg. Medication adherence good.  -Stop lisinopril 20 mg daily  -Started olmesartan-HCTZ 20-12.5 mg daily   Written patient instructions provided. Patient verbalized understanding of treatment plan.  Total time in face to face counseling 25 minutes.    Follow-up:   Pharmacist follow-up on May 2024 (3 month LIBERATE follow-up) PCP visit with Dr. Owens Shark in April 2024 Patient seen with Joseph Art, PharmD, PGY2 Pharmacy Resident.

## 2022-07-01 NOTE — Assessment & Plan Note (Signed)
Hypertension longstanding currently slightly above goal in clinic. Blood pressure goal of <130/80 mmHg. Medication adherence good.  -Stop lisinopril 20 mg daily  -Started olmesartan-HCTZ 20-12.5 mg daily

## 2022-07-02 MED ORDER — METFORMIN HCL ER 500 MG PO TB24: ORAL_TABLET | ORAL | 3 refills | Status: DC

## 2022-07-02 NOTE — Progress Notes (Signed)
Reviewed and agree with Dr Koval's plan.   

## 2022-07-13 ENCOUNTER — Other Ambulatory Visit (HOSPITAL_COMMUNITY): Payer: Self-pay

## 2022-07-17 ENCOUNTER — Other Ambulatory Visit (HOSPITAL_COMMUNITY): Payer: Self-pay

## 2022-07-19 ENCOUNTER — Other Ambulatory Visit: Payer: Self-pay

## 2022-08-06 ENCOUNTER — Ambulatory Visit: Payer: Medicaid Other | Admitting: Student

## 2022-08-19 ENCOUNTER — Encounter (INDEPENDENT_AMBULATORY_CARE_PROVIDER_SITE_OTHER): Payer: Medicaid Other | Admitting: Pharmacist

## 2022-08-19 ENCOUNTER — Encounter: Payer: Medicaid Other | Admitting: Pharmacist

## 2022-08-19 ENCOUNTER — Encounter: Payer: Self-pay | Admitting: Pharmacist

## 2022-08-19 VITALS — BP 111/85 | HR 85 | Ht 69.0 in | Wt 193.4 lb

## 2022-08-19 DIAGNOSIS — H2512 Age-related nuclear cataract, left eye: Secondary | ICD-10-CM | POA: Diagnosis not present

## 2022-08-19 DIAGNOSIS — E119 Type 2 diabetes mellitus without complications: Secondary | ICD-10-CM

## 2022-08-19 DIAGNOSIS — I10 Essential (primary) hypertension: Secondary | ICD-10-CM

## 2022-08-19 DIAGNOSIS — E559 Vitamin D deficiency, unspecified: Secondary | ICD-10-CM

## 2022-08-19 DIAGNOSIS — H401112 Primary open-angle glaucoma, right eye, moderate stage: Secondary | ICD-10-CM | POA: Diagnosis not present

## 2022-08-19 DIAGNOSIS — Z961 Presence of intraocular lens: Secondary | ICD-10-CM | POA: Diagnosis not present

## 2022-08-19 DIAGNOSIS — H40012 Open angle with borderline findings, low risk, left eye: Secondary | ICD-10-CM | POA: Diagnosis not present

## 2022-08-19 MED ORDER — OLMESARTAN MEDOXOMIL-HCTZ 20-12.5 MG PO TABS: 1.0000 | ORAL_TABLET | Freq: Every day | ORAL | 3 refills | Status: DC

## 2022-08-19 MED ORDER — METFORMIN HCL ER 500 MG PO TB24: 1000.0000 mg | ORAL_TABLET | Freq: Every day | ORAL | 3 refills | Status: DC

## 2022-08-19 MED ORDER — VITAMIN D3 1.25 MG (50000 UT) PO CAPS: 1.0000 | ORAL_CAPSULE | ORAL | 3 refills | Status: DC

## 2022-08-19 MED ORDER — TRULICITY 1.5 MG/0.5ML ~~LOC~~ SOAJ: 1.5000 mg | SUBCUTANEOUS | 3 refills | Status: DC

## 2022-08-19 NOTE — Assessment & Plan Note (Signed)
Diabetes longstanding, currently improved control based on previous CGM data and glucometer readings. Patient is able to verbalize appropriate hypoglycemia management plan. Medication adherence appears appropriate. 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 Reconnected with FreeStyle Libre 3 app during today's visit. -Continued GLP-1 Trulicity (dulaglutide) 1.5 mg weekly -Continued metformin 1000 mg once daily. Updated prescription sent to pharmacy.   -Extensively discussed pathophysiology of diabetes, recommended lifestyle interventions, dietary effects on blood sugar control.  -Counseled on s/sx of and management of hypoglycemia.  -Next A1c anticipated 2 weeks for LIBERATE 3 month A1c.

## 2022-08-19 NOTE — Research (Signed)
S:    Chief Complaint  Patient presents with   Medication Management    T2DM f/up   52 y.o. female who presents for diabetes evaluation, education, and management. T2DM, diastolic HF, HTN, HLD.    Patient was referred and last seen by Primary Care Provider, Dr. Deirdre Priest, on 04/20/2022. Last seen by pharmacy clinic on 07/01/2022.   At last visit, lisinopril 20 mg was DC'd and olmesartan-HCTZ 20-12.5 mg was started. TIR for CGM was 66%.   Today, patient arrives in good spirits and presents without any assistance. Reports phone would not pair with CGM, she has not had a sensor on since the end of March. Has been occasionally checking with glucometer, BG remains controlled. Reconnected with CGM today.   Patient reports Diabetes was diagnosed in December 2019.   Current diabetes medications include: Trulicity 1.5 mg weekly, metformin 1000 mg daily Current hypertension medications include: olmesartan-HCTZ 20-12.5 mg daily Current hyperlipidemia medications include: rosuvastatin 20 mg daily  Patient reports adherence to taking all medications as prescribed.   Insurance coverage: Medicaid  Patient denies hypoglycemic events.  Patient denies nocturia (nighttime urination).  Patient denies neuropathy (nerve pain). Patient denies visual changes. Patient reports self foot exams.   O:  Review of Systems  All other systems reviewed and are negative.   Physical Exam Constitutional:      Appearance: Normal appearance.  Pulmonary:     Effort: Pulmonary effort is normal.  Neurological:     Mental Status: She is alert.  Psychiatric:        Mood and Affect: Mood normal.        Behavior: Behavior normal.        Thought Content: Thought content normal.    30 day glucometer average on home glucometer: 156 mg/dL  (10 readings)   Lab Results  Component Value Date   HGBA1C 9.5 (A) 05/26/2022   Vitals:   08/19/22 1015  BP: 111/85  Pulse: 85  SpO2: 100%    Lipid Panel      Component Value Date/Time   CHOL 115 08/18/2021 1518   TRIG 106 08/18/2021 1518   HDL 41 08/18/2021 1518   CHOLHDL 2.8 08/18/2021 1518   CHOLHDL 4.3 05/25/2016 1620   VLDL 39 (H) 05/25/2016 1620   LDLCALC 54 08/18/2021 1518    Clinical Atherosclerotic Cardiovascular Disease (ASCVD): No  The ASCVD Risk score (Arnett DK, et al., 2019) failed to calculate for the following reasons:   The valid total cholesterol range is 130 to 320 mg/dL    A/P: Diabetes longstanding, currently improved control based on previous CGM data and glucometer readings. Patient is able to verbalize appropriate hypoglycemia management plan. Medication adherence appears appropriate. 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 Reconnected with [employer] Libre 3 app during today's visit. -Continued GLP-1 Trulicity (dulaglutide) 1.5 mg weekly -Continued metformin 1000 mg once daily. Updated prescription sent to pharmacy.   -Extensively discussed pathophysiology of diabetes, recommended lifestyle interventions, dietary effects on blood sugar control.  -Counseled on s/sx of and management of hypoglycemia.  -Next A1c anticipated 2 weeks for LIBERATE 3 month A1c.   Hypertension longstanding, currently well controlled. Blood pressure goal of <130/80 mmHg. Medication adherence reported. -Continued olmesartan-HCTZ 20-12.5 mg daily.  History of Vitamin D deficiency. Last Vitamin D level was 39.1 in July 2022. Patient request refill on weekly Vitamin D supplement. -Continue cholecalciferol (vitamin D3) 1.25 mg weekly.  Written patient instructions provided. Patient verbalized understanding of  treatment plan.  Total time in face to face counseling 30 minutes.    Follow-up:  Pharmacist 2 weeks. Patient seen with Bing Plume, PharmD Candidate and Valeda Malm, PharmD, PGY2 Pharmacy Resident.

## 2022-08-19 NOTE — Assessment & Plan Note (Signed)
Hypertension longstanding, currently well controlled. Blood pressure goal of <130/80 mmHg. Medication adherence reported. -Continued olmesartan-HCTZ 20-12.5 mg daily.

## 2022-08-19 NOTE — Patient Instructions (Signed)
It was nice to see you today!  Your goal blood sugar is 80-130 before eating and less than 180 after eating.  No Medication Changes today. Refills for your medications were sent to [facility].   Monitor blood sugars at home and keep a log (glucometer or piece of paper) to bring with you to your next visit.  Keep up the good work with diet and exercise. Aim for a diet full of vegetables, fruit and lean meats (chicken, [place], fish). Try to limit salt intake by eating fresh or frozen vegetables (instead of canned), rinse canned vegetables prior to cooking and do not add any additional salt to meals.

## 2022-08-23 IMAGING — US US RENAL
1 series · 14 of 25 positions shown · non-contrast
Comparison: None.

CLINICAL DATA: Intermittent left flank pain

EXAM:
RENAL / URINARY TRACT ULTRASOUND COMPLETE

[Series 1: us renal · 14 of 37 slices shown]
[im 1/37]
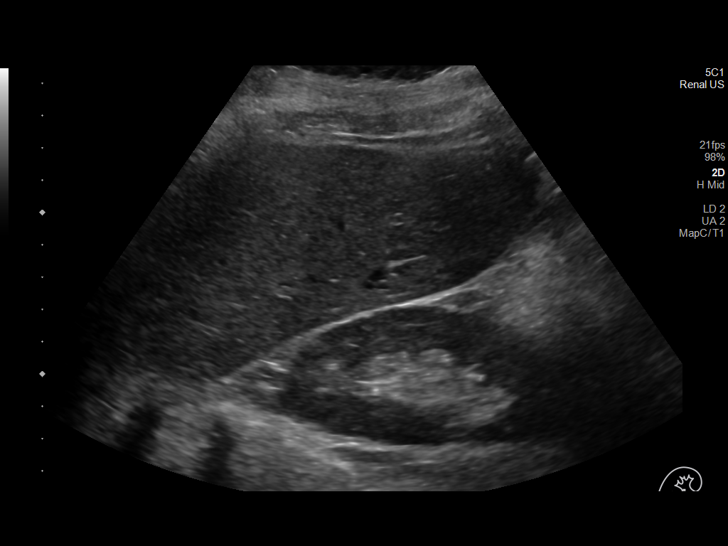
[im 4/37]
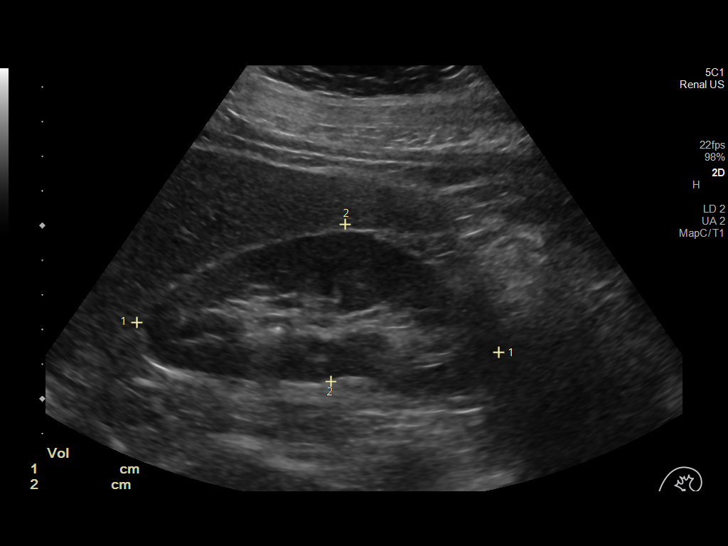
[im 7/37]
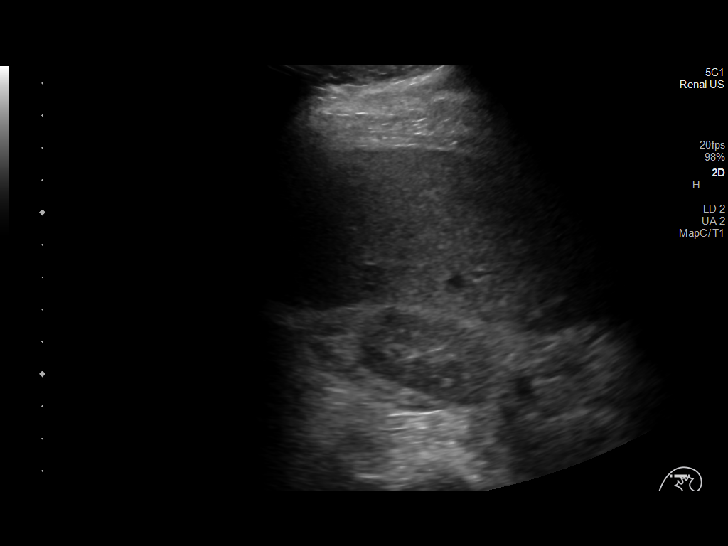
[im 10/37]
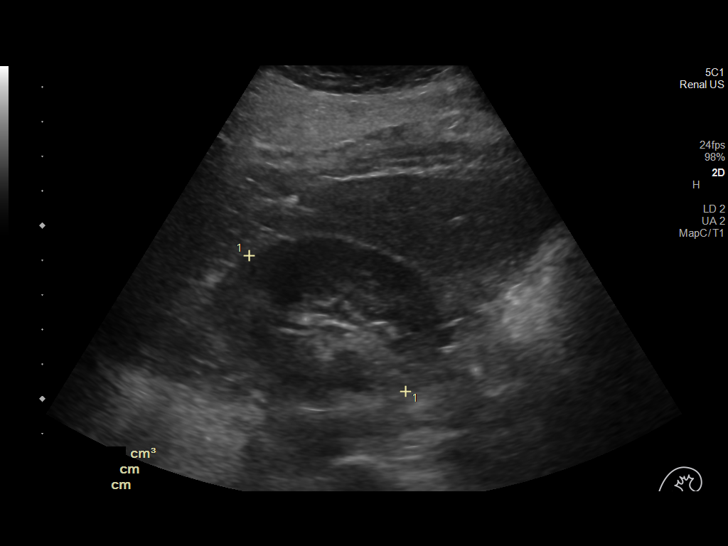
[im 13/37]
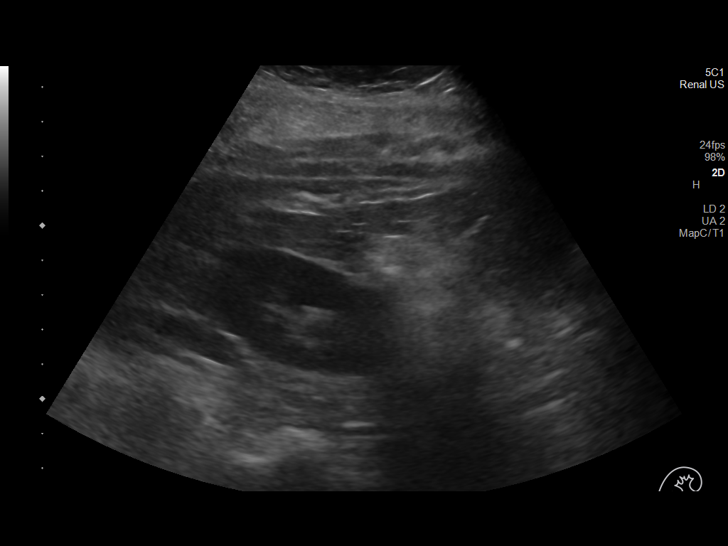
[im 14/37]
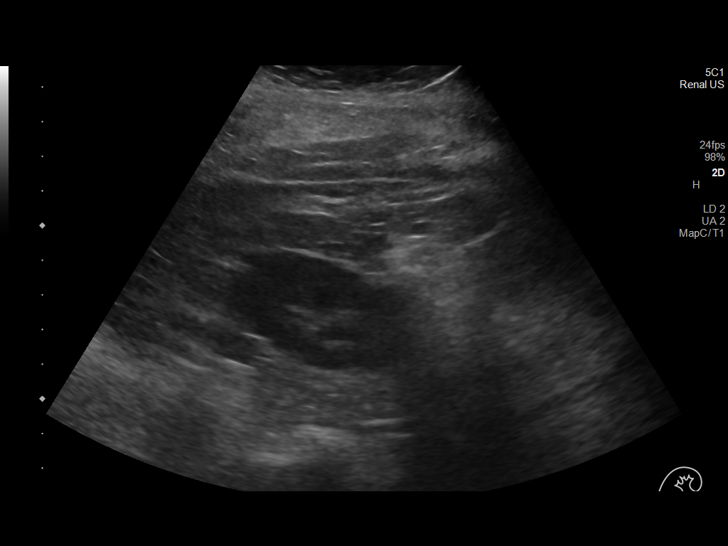
[im 17/37]
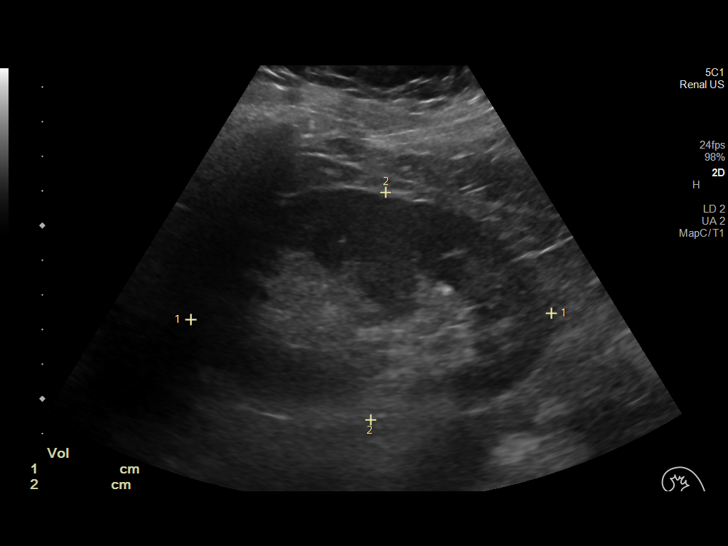
[im 20/37]
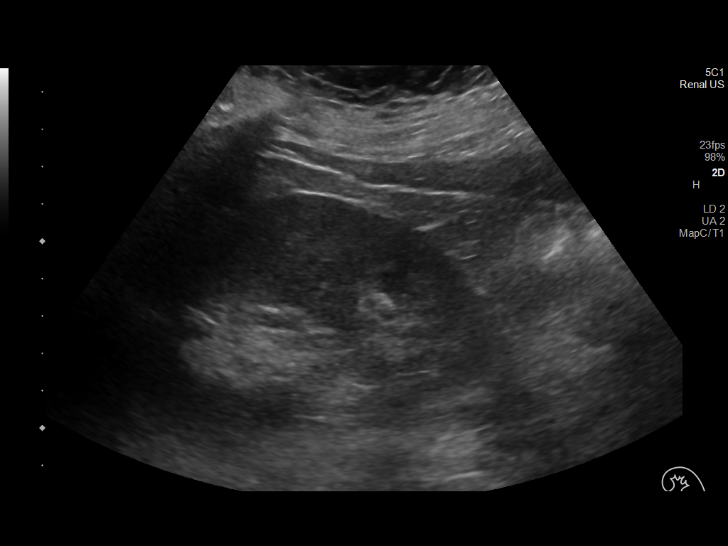
[im 23/37]
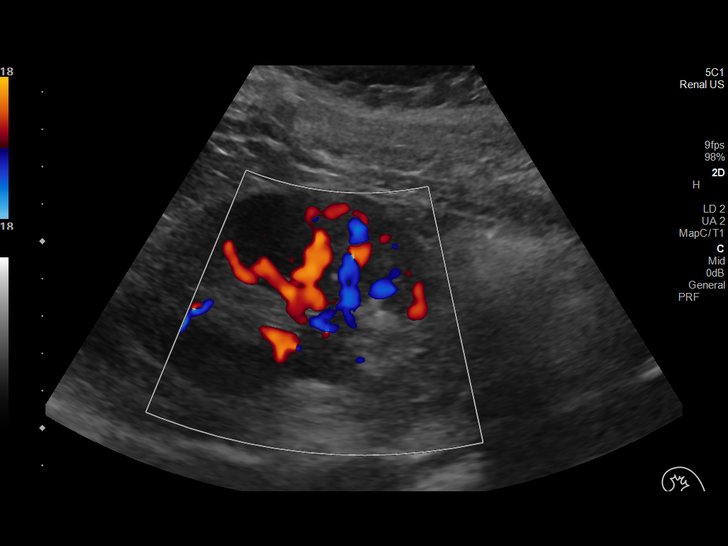
[im 25/37]
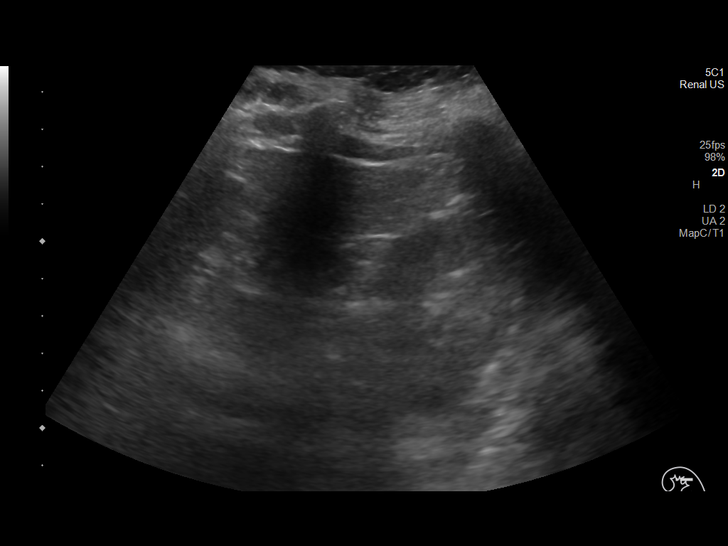
[im 28/37]
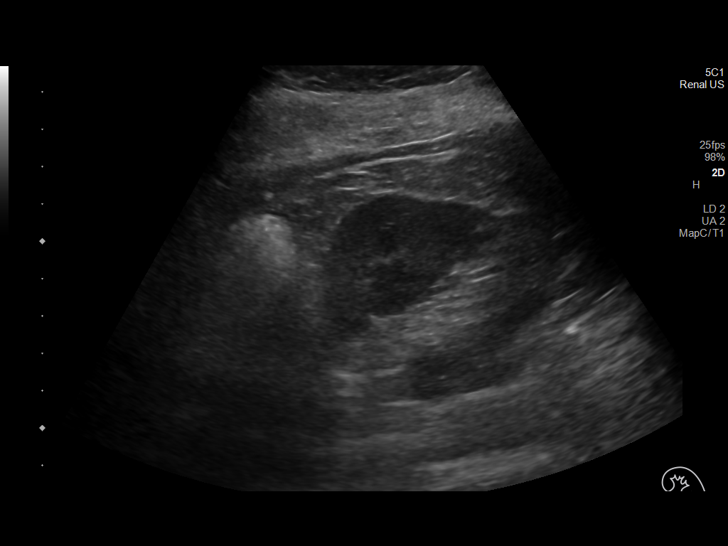
[im 31/37]
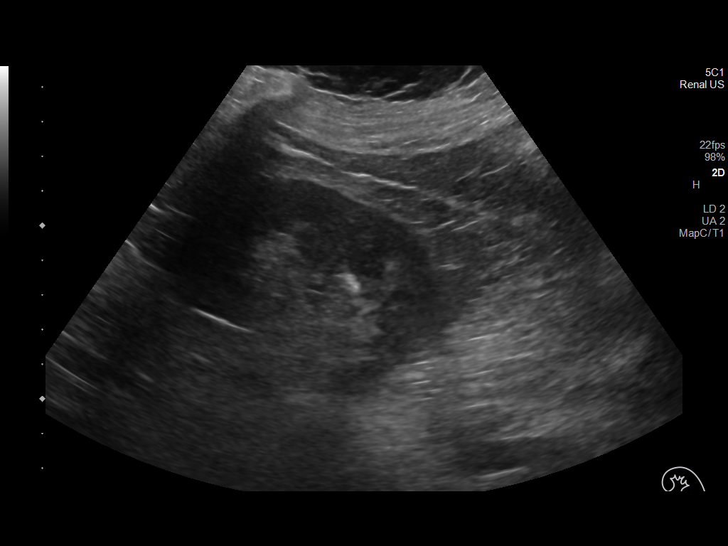
[im 34/37]
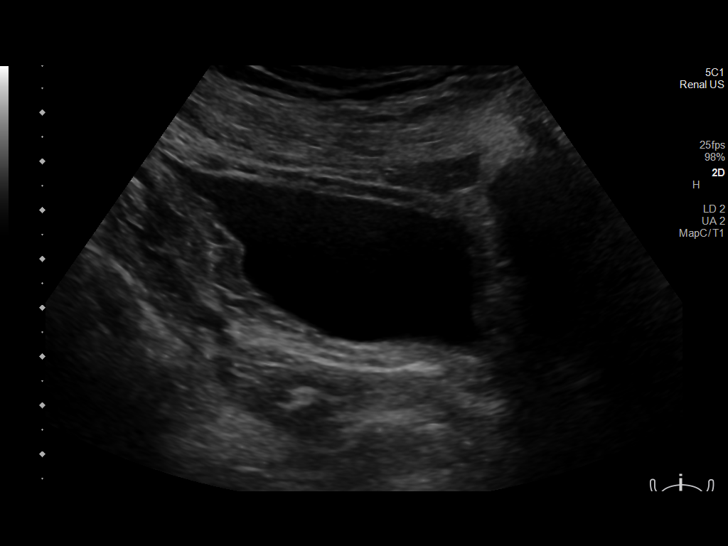
[im 37/37]
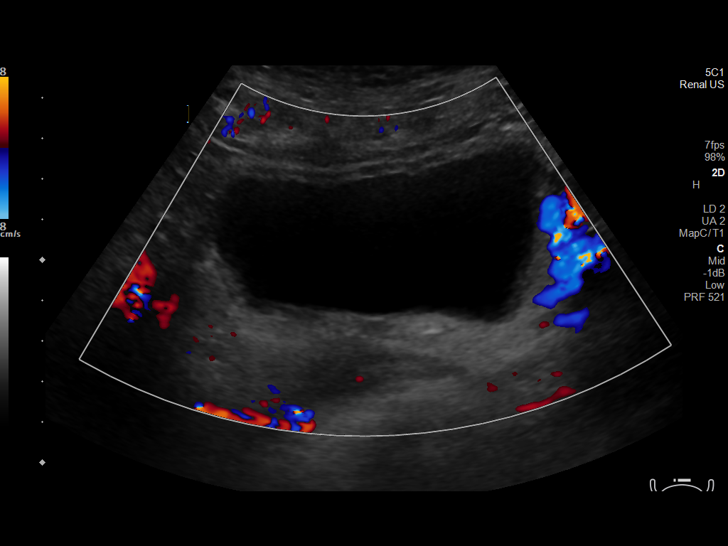

[14 of 25 positions shown; findings below may reference images not displayed]

FINDINGS: Right Kidney:

Renal measurements: 10.5 x 4.6 x 6.0 cm = volume: 149 mL.
Echogenicity within normal limits. No mass or hydronephrosis
visualized.

Left Kidney:

Renal measurements: 10.4 x 6.6 x 6.1 cm = volume: 218 mL.
Echogenicity within normal limits. No mass or hydronephrosis
visualized. Subcentimeter echogenic shadowing lower pole calculus
measures only 5 mm.

Bladder:

Appears normal for degree of bladder distention.

Other:

None.
IMPRESSION: No acute finding or hydronephrosis.

Suspect subcentimeter left nephrolithiasis

## 2022-08-31 ENCOUNTER — Encounter: Payer: Medicaid Other | Admitting: Pharmacist

## 2022-08-31 LAB — HM DIABETES EYE EXAM

## 2022-09-10 ENCOUNTER — Telehealth: Payer: Self-pay | Admitting: Pharmacist

## 2022-09-10 ENCOUNTER — Encounter: Payer: Medicaid Other | Admitting: Pharmacist

## 2022-09-10 NOTE — Telephone Encounter (Signed)
Asked patient to return call and schedule appointment next week for 3 month Libreate Study F/U   I am OK with her being "double-booked" to make this happen in the next four days of clinic (through 09/16/2022)

## 2022-09-14 ENCOUNTER — Telehealth: Payer: Self-pay | Admitting: Pharmacist

## 2022-09-14 NOTE — Telephone Encounter (Signed)
Attempted to contact patient to discuss scheduling of planned - 3 month follow-up visit and provision of study sensors.  No answer  Left HIPAA compliant message requesting patient call to reschedule.  Left direct phone # for contact [phone].

## 2022-09-20 ENCOUNTER — Telehealth: Payer: Self-pay

## 2022-09-20 ENCOUNTER — Other Ambulatory Visit (HOSPITAL_COMMUNITY): Payer: Self-pay

## 2022-09-20 DIAGNOSIS — E119 Type 2 diabetes mellitus without complications: Secondary | ICD-10-CM

## 2022-09-20 MED ORDER — TRULICITY 1.5 MG/0.5ML ~~LOC~~ SOAJ
1.5000 mg | SUBCUTANEOUS | 3 refills | Status: DC
  Filled 2022-09-20: qty 2, 28d supply, fill #0

## 2022-09-20 NOTE — Telephone Encounter (Signed)
Medication has been approved from 09/11/2022-09/20/2023.  Pharmacy has been updated and the medication is going through for 25 dollars.   Patient has been made aware.

## 2022-09-20 NOTE — Telephone Encounter (Signed)
PA submitted on covermymeds for Trulicity 1.5mg .   Key: [id number]  Will await response.

## 2022-09-20 NOTE — Telephone Encounter (Signed)
Patient calls nurse line in regards to Trulicity 1.5mg .   [facility] has no stock of medication.   I called [facility] and they report adequate stock.   Medication will need a PA. I will complete this.   Please send medication to [facility].

## 2022-09-27 ENCOUNTER — Telehealth: Payer: Self-pay | Admitting: Pharmacist

## 2022-09-27 NOTE — Telephone Encounter (Signed)
Attempted to contact patient to discuss scheduling of planned - 3 month follow-up visit and provision of study sensors.  No answer  Left HIPAA compliant message requesting patient call to reschedule.  Left direct phone # for contact [phone].   

## 2022-09-28 ENCOUNTER — Telehealth: Payer: Self-pay

## 2022-09-28 ENCOUNTER — Ambulatory Visit (INDEPENDENT_AMBULATORY_CARE_PROVIDER_SITE_OTHER): Payer: Self-pay | Admitting: Student

## 2022-09-28 ENCOUNTER — Other Ambulatory Visit (HOSPITAL_COMMUNITY): Payer: Self-pay

## 2022-09-28 VITALS — BP 110/82 | HR 103 | Ht 69.0 in | Wt 190.4 lb

## 2022-09-28 DIAGNOSIS — I5032 Chronic diastolic (congestive) heart failure: Secondary | ICD-10-CM

## 2022-09-28 DIAGNOSIS — R0609 Other forms of dyspnea: Secondary | ICD-10-CM

## 2022-09-28 MED ORDER — FUROSEMIDE 20 MG PO TABS
20.0000 mg | ORAL_TABLET | Freq: Every day | ORAL | 0 refills | Status: DC
  Filled 2022-09-28: qty 3, 3d supply, fill #0

## 2022-09-28 NOTE — Telephone Encounter (Signed)
Patient calls nurse line requesting to schedule appointment for shortness of breath. Reports shortness of breath when going up the stairs at her house.   She has been sick over the weekend. Reports chest congestion, feeling slightly better today.   Speaking in complete sentences, no shortness of breath at this time.   Patient spoke with front office prior to our conversation and was scheduled for 10:50 this AM.   Advised to keep this appointment for evaluation. Discussed ED precautions between now and scheduled appointment. Patient verbalizes understanding.   Veronda Prude, RN

## 2022-09-28 NOTE — Progress Notes (Signed)
    SUBJECTIVE:   CHIEF COMPLAINT / HPI:   Shortness of Breath Viral sx started over the weekend. Nasal congestion and cough were her primary symptoms. However, noticed that she was becoming short of breath when climbing the stairs and feeling easily fatigued with minimal exertion which is unusual for her. No fevers.  Has a history of diastolic HF, G2DD in 2019. Was previously on diuretics but has been off of these altogether for years.  Denies any history of blood clot or swelling in her legs or abdomen recently.   PERTINENT  PMH / PSH: T2DM, Chronic diastolic HF  OBJECTIVE:   BP 110/82   Pulse (!) 103   Ht 5\' 9"  (1.753 m)   Wt 190 lb 6 oz (86.4 kg)   LMP 09/14/2022   SpO2 100%   BMI 28.11 kg/m   Gen: In good spirits and NAD Cardio: Tachycardic, without murmur or gallop Pulm: Normal WOB on RA, speaking in full sentences, crackles in bilateral bases Abd: non-tender, non-distended Ext: Bilateral LE with calf circumference 43 cm. No tenderness, warmth, or erythema.   POCUS with B lines in the bilateral lung bases.  ASSESSMENT/PLAN:   Dyspnea on exertion Suspect that her symptoms are due either to her viral illness or volume accumulation 2/2 diastolic CHF in the setting of viral illness. Bibasilar crackles and B-lines on POCUS are suggestive of volume accumulation related to her CHF.  Thankfully she is stable on room air with no increased work of breathing and SpO2 100%.  She is mildly tachycardic but Wells Score for PE is just 1.5 points. (Points only for tachycardia) No indication for Dimer or CTA at this time.  - Will trial three days of Lasix, follow up in one week. If improved, will not need further workup. However, if symptoms persist would get a CXR and consider repeating her echo.  - Advised to make follow-up appointment with her primary cardiologist, Dr. Cristal Deer.      Eliezer Mccoy, MD [facility]

## 2022-09-28 NOTE — Assessment & Plan Note (Addendum)
Suspect that her symptoms are due either to her viral illness or volume accumulation 2/2 diastolic CHF in the setting of viral illness. Bibasilar crackles and B-lines on POCUS are suggestive of volume accumulation related to her CHF.  Thankfully she is stable on room air with no increased work of breathing and SpO2 100%.  She is mildly tachycardic but Wells Score for PE is just 1.5 points. (Points only for tachycardia) No indication for Dimer or CTA at this time.  - Will trial three days of Lasix, follow up in one week. If improved, will not need further workup. However, if symptoms persist would get a CXR and consider repeating her echo.  - Advised to make follow-up appointment with her primary cardiologist, Dr. Cristal Deer.

## 2022-09-28 NOTE — Patient Instructions (Addendum)
Ms. Hewgley,   It's great to meet you! I'm sorry you've been feeling yucky. As we discussed, this is most likely either just your virus or maybe you're carrying a bit of fluid on your lungs from your known heart failure. Let's give you three days of Lasix and see if that helps.  Come back to see me next week. (If you're back to yourself, feel free to cancel that appt).  Go ahead and make your next follow up with Dr. Cristal Deer.   IF you start to develop chest tightness or chest pain or increasing shortness of breath, do not hesitate to come back to see [place] to go to the ER.   Eliezer Mccoy, MD

## 2022-09-29 ENCOUNTER — Telehealth: Payer: Self-pay | Admitting: Pharmacist

## 2022-09-29 DIAGNOSIS — E119 Type 2 diabetes mellitus without complications: Secondary | ICD-10-CM

## 2022-09-29 NOTE — Telephone Encounter (Signed)
-----   Message from Kathrin Ruddy, RPH-CPP sent at 09/16/2022  6:02 PM EDT ----- Regarding: Liberate Schedule

## 2022-09-29 NOTE — Assessment & Plan Note (Signed)
Attempted to contact patient in order to request reschedule for 3 month Liberate study follow-up.   Left HIPAA compliant message requesting patient return call to office and schedule at earliest convenience.

## 2022-09-29 NOTE — Telephone Encounter (Signed)
Attempted to contact patient in order to request reschedule for 3 month Liberate study follow-up.   Left HIPAA compliant message requesting patient return call to office and schedule at earliest convenience.  

## 2022-10-04 ENCOUNTER — Telehealth: Payer: Self-pay | Admitting: Pharmacist

## 2022-10-04 NOTE — Telephone Encounter (Signed)
Attempted to contact patient RE make-up of Liberate 3 month appt.   No answer.   Left message requesting call back.

## 2022-10-05 ENCOUNTER — Ambulatory Visit (HOSPITAL_COMMUNITY)
Admission: RE | Admit: 2022-10-05 | Discharge: 2022-10-05 | Disposition: A | Discharge status: Home / Self Care | Payer: 59 | Source: Ambulatory Visit | Attending: Family Medicine | Admitting: Family Medicine

## 2022-10-05 ENCOUNTER — Other Ambulatory Visit: Payer: Self-pay

## 2022-10-05 ENCOUNTER — Ambulatory Visit
Admission: RE | Admit: 2022-10-05 | Discharge: 2022-10-05 | Disposition: A | Discharge status: Home / Self Care | Payer: 59 | Source: Ambulatory Visit | Attending: Family Medicine | Admitting: Family Medicine

## 2022-10-05 ENCOUNTER — Ambulatory Visit: Payer: 59 | Admitting: Student

## 2022-10-05 ENCOUNTER — Ambulatory Visit (INDEPENDENT_AMBULATORY_CARE_PROVIDER_SITE_OTHER): Payer: 59 | Admitting: Pharmacist

## 2022-10-05 ENCOUNTER — Encounter: Payer: Self-pay | Admitting: Pharmacist

## 2022-10-05 ENCOUNTER — Other Ambulatory Visit (HOSPITAL_COMMUNITY): Payer: Self-pay

## 2022-10-05 VITALS — BP 106/64 | HR 93 | Ht 67.0 in | Wt 189.6 lb

## 2022-10-05 DIAGNOSIS — E119 Type 2 diabetes mellitus without complications: Secondary | ICD-10-CM

## 2022-10-05 DIAGNOSIS — R0609 Other forms of dyspnea: Secondary | ICD-10-CM

## 2022-10-05 LAB — GLUCOSE, POCT (MANUAL RESULT ENTRY): POC Glucose: 221 mg/dl — AB (ref 70–99)

## 2022-10-05 LAB — POCT GLYCOSYLATED HEMOGLOBIN (HGB A1C): HbA1c, POC (controlled diabetic range): 8.3 % — AB (ref 0.0–7.0)

## 2022-10-05 MED ORDER — TRULICITY 0.75 MG/0.5ML ~~LOC~~ SOAJ
0.7500 mg | SUBCUTANEOUS | 3 refills | Status: DC
Start: 2022-10-05 — End: 2022-12-29
  Filled 2022-10-05: qty 2, 28d supply, fill #0
  Filled 2022-11-10: qty 2, 28d supply, fill #1
  Filled 2022-12-08: qty 2, 28d supply, fill #2

## 2022-10-05 MED ORDER — EMPAGLIFLOZIN 25 MG PO TABS
25.0000 mg | ORAL_TABLET | Freq: Every day | ORAL | 3 refills | Status: DC
  Filled 2022-10-05 – 2022-10-18 (×2): qty 30, 30d supply, fill #0

## 2022-10-05 NOTE — Progress Notes (Signed)
SUBJECTIVE:   CHIEF COMPLAINT / HPI:   Dyspnea on Exertion Seen by me 1 week ago for persistent DOE after viral illness. Exam and POCUS concerning for pulmonary edema so was given three days of Lasix. Unfortunately had no improvement with the Lasix. She has no issues at rest, only with activity. Recovers quickly if she stops what she has been doing.   She continues to deny any history of chest pain or tightness during this illness course. No palpitation or syncope.   She was able to make a  follow-up appt with her primary cardiologist Dr. Cristal Deer, but this is not until 7/22. She is on the cancellation list for their office.   When asked about nausea, she tells me that she is generally "always" nauseous at baseline, especially since starting her Trulicity. She did have an episode of vomiting shortly after lunch on Friday, but felt fine afterwards.    OBJECTIVE:   BP 106/64   Pulse 93   Ht 5\' 7"  (1.702 m)   Wt 189 lb 9.6 oz (86 kg)   LMP 09/14/2022   SpO2 99%   BMI 29.70 kg/m   Physical Exam Constitutional:      Comments: In good spirits, NAD  HENT:     Mouth/Throat:     Mouth: Mucous membranes are moist.  Cardiovascular:     Rate and Rhythm: Normal rate and regular rhythm.     Heart sounds: No murmur heard. Pulmonary:     Comments: Normal WOB on RA, maintaining O2 sats on RA. Bibasilar crackles, R>L.  Abdominal:     General: Abdomen is flat. There is no distension.  Musculoskeletal:        General: No swelling or deformity.     Cervical back: Neck supple.     Right lower leg: No edema.     Left lower leg: No edema.  Skin:    General: Skin is warm and dry.      ASSESSMENT/PLAN:   Dyspnea on exertion Differential remains broad and includes post-viral syndrome vs CHF vs pneumonia vs cardiac ischemia vs anemia vs thyroid dysfunction. Could also consider PE, though less likely at this point given Wells Score of 0.  I remain most concerned for CHF contributing,  it is possible that the 20mg  of Lasix that we gave her did not meet her threshold given that she did not seem to have much in the way of increased UOP with this. Has bibasilar crackles on exam, R>L, so will get a CXR to characterize lung findings and guide therapy. If it appears to be pulmonary edema, would favor higher doses of Lasix. Note, however, a stable weight over time with a general downward trend, though she is on Trulicity which clouds the picture. No peripheral edema, but she tells me she generally carries fluid in her belly.  EKG obtained in the office without obvious ischemic changes. There is a downsloping ST segment in V1 and upsloping ST segment in V2 but on review of prior tracings, these have been present previously.  Thankfully her vitals are all WNL and she is clinically quite stable.  - CXR - BNP, CBC, TSH, BMP  Type 2 diabetes mellitus without complication, without long-term current use of insulin (HCC) Fingerstick glucose and A1c obtained today with the consideration that uncontrolled hyperglycemia could contribute to her symptoms. Random glucose 221. Improving glycemic control with A1c 8.3% today. She prefers to defer any medication changes to Dr. Raymondo Band and will make a  follow-up appointment with him on her way out today.  - F/u with Dr. Raymondo Band  - Consider that her Trulicity is likely causative of or exacerbating her baseline nausea     J Dorothyann Gibbs, MD [facility]

## 2022-10-05 NOTE — Assessment & Plan Note (Addendum)
Differential remains broad and includes post-viral syndrome vs CHF vs pneumonia vs cardiac ischemia vs anemia vs thyroid dysfunction. Could also consider PE, though less likely at this point given Wells Score of 0.  I remain most concerned for CHF contributing, it is possible that the 20mg  of Lasix that we gave her did not meet her threshold given that she did not seem to have much in the way of increased UOP with this. Has bibasilar crackles on exam, R>L, so will get a CXR to characterize lung findings and guide therapy. If it appears to be pulmonary edema, would favor higher doses of Lasix. Note, however, a stable weight over time with a general downward trend, though she is on Trulicity which clouds the picture. No peripheral edema, but she tells me she generally carries fluid in her belly.  EKG obtained in the office without obvious ischemic changes. There is a downsloping ST segment in V1 and upsloping ST segment in V2 but on review of prior tracings, these have been present previously.  Thankfully her vitals are all WNL and she is clinically quite stable.  - CXR - BNP, CBC, TSH, BMP

## 2022-10-05 NOTE — Patient Instructions (Addendum)
It was nice to see you today!  Your goal blood sugar is 80-130 before eating and less than 180 after eating.  Medication Changes:  Begin Jardiance (empagliflozin) 25mg  daily  Trulicity Decrease to 0.75mg  weekly - pick this up and start this dose at next available time (stop the 1.5mg  dose)   Keep up the good work with diet and exercise. Aim for a diet full of vegetables, fruit and lean meats (chicken, [place], fish). Try to limit salt intake by eating fresh or frozen vegetables (instead of canned), rinse canned vegetables prior to cooking and do not add any additional salt to meals.

## 2022-10-05 NOTE — Progress Notes (Signed)
    S:     Chief Complaint  Patient presents with   Medication Management    Diabetes - Liberate study 3 month   52 y.o. female who presents for diabetes evaluation, education, and management in the context of the LIBERATE Study.  PMH is significant for heart failure. .  Patient was referred and last seen by Primary Care Provider, Dr. Marisue Humble earlier today.    At last visit, patient was adjusted to higher dose of Trulicity (dulaglutide).  She has continued on the 1.5mg  dose however reports she does not fell well on the higher dose.  She has not been using her CGM due to lack of adhesion.    Today, patient arrives in fair spirits and presents without any assistance. She verbalized that she has not felt well recently and was short of breath for the last several weeks.    Patient denies hypoglycemic events.   Patient-reported exercise habits: currently limited due to illness.   X-ray from earlier in the day was evaluated for fluid as a cause of shortness of breath.  Reviewed with attending physician, Dr. Jennette Kettle.    O:   Review of Systems  Constitutional:  Positive for malaise/fatigue.  Respiratory:  Positive for shortness of breath.   Cardiovascular:  Negative for leg swelling.    Physical Exam   Lab Results  Component Value Date   HGBA1C 8.3 (A) 10/05/2022   Vitals reviewed from earlier visit in the day.   Lipid Panel     Component Value Date/Time   CHOL 115 08/18/2021 1518   TRIG 106 08/18/2021 1518   HDL 41 08/18/2021 1518   CHOLHDL 2.8 08/18/2021 1518   CHOLHDL 4.3 05/25/2016 1620   VLDL 39 (H) 05/25/2016 1620   LDLCALC 54 08/18/2021 1518       A/P:  LIBERATE Study: As patient reported having 4 sensors at home.  Six sensors provided for a 3-4 month supply. Educated to contact the office if the sensor falls off early and replacements are needed before their next [employer].   Diabetes longstanding and currently fair control however side effect limit  use. Patient is able to verbalize appropriate hypoglycemia management plan. Medication adherence appears fair. Control is suboptimal due to recent illness and GI side effects from . -Decreased dose of GLP-1 Trulicity (dulaglutide) from 1.5 to 0.75mg  weekly.  -Restarted SGLT2-I Jardiance (empagliflozin) at 25mg  to assist with both diabetes and CV. Counseled on sick day rules. -Continued metformin 1000mg  BID   -Patient educated on purpose, proper use, and potential adverse effects.  -Extensively discussed pathophysiology of diabetes, recommended lifestyle interventions, dietary effects on blood sugar control.  -Counseled on s/sx of and management of hypoglycemia.  - Reinitiated CGM today    Written patient instructions provided. Patient verbalized understanding of treatment plan.  Total time in face to face counseling 48 minutes.    Follow-up:  Pharmacist 1 month. PCP clinic visit in PRN.

## 2022-10-05 NOTE — Assessment & Plan Note (Signed)
  LIBERATE Study: As patient reported having 4 sensors at home.  Six sensors provided for a 3-4 month supply. Educated to contact the office if the sensor falls off early and replacements are needed before their next [employer].   Diabetes longstanding and currently fair control however side effect limit use. Patient is able to verbalize appropriate hypoglycemia management plan. Medication adherence appears fair. Control is suboptimal due to recent illness and GI side effects from . -Decreased dose of GLP-1 Trulicity (dulaglutide) from 1.5 to 0.75mg  weekly.  -Restarted SGLT2-I Jardiance (empagliflozin) at 25mg  to assist with both diabetes and CV. Counseled on sick day rules. -Continued metformin 1000mg  BID   -Patient educated on purpose, proper use, and potential adverse effects.  -Extensively discussed pathophysiology of diabetes, recommended lifestyle interventions, dietary effects on blood sugar control.  -Counseled on s/sx of and management of hypoglycemia.  - Reinitiated CGM today

## 2022-10-05 NOTE — Assessment & Plan Note (Signed)
Fingerstick glucose and A1c obtained today with the consideration that uncontrolled hyperglycemia could contribute to her symptoms. Random glucose 221. Improving glycemic control with A1c 8.3% today. She prefers to defer any medication changes to Dr. Raymondo Band and will make a follow-up appointment with him on her way out today.  - F/u with Dr. Raymondo Band  - Consider that her Trulicity is likely causative of or exacerbating her baseline nausea

## 2022-10-05 NOTE — Patient Instructions (Addendum)
Please go to [facility] at [employer] to get your X-Ray done. They are open 7:30a-5p Monday-Friday. You do not need an appointment to get this done.   I am checking some labs today, I will call you with these results.   As we discussed last week, if things change and you get worse, please either come back to see [place] or if things are particularly bad, please go to the ER.   Hopefully Dr. Cristal Deer will have a cancellation and can get you in sooner!   Be well,  Eliezer Mccoy, MD

## 2022-10-06 ENCOUNTER — Other Ambulatory Visit (HOSPITAL_COMMUNITY): Payer: Self-pay

## 2022-10-06 LAB — CBC
Hematocrit: 38.6 % (ref 34.0–46.6)
Hemoglobin: 11.7 g/dL (ref 11.1–15.9)
MCH: 25 pg — ABNORMAL LOW (ref 26.6–33.0)
MCHC: 30.3 g/dL — ABNORMAL LOW (ref 31.5–35.7)
MCV: 83 fL (ref 79–97)
Platelets: 240 10*3/uL (ref 150–450)
RBC: 4.68 x10E6/uL (ref 3.77–5.28)
RDW: 14.4 % (ref 11.7–15.4)
WBC: 7 10*3/uL (ref 3.4–10.8)

## 2022-10-06 LAB — BASIC METABOLIC PANEL
BUN/Creatinine Ratio: 15 (ref 9–23)
BUN: 17 mg/dL (ref 6–24)
CO2: 21 mmol/L (ref 20–29)
Calcium: 10.5 mg/dL — ABNORMAL HIGH (ref 8.7–10.2)
Chloride: 100 mmol/L (ref 96–106)
Creatinine, Ser: 1.16 mg/dL — ABNORMAL HIGH (ref 0.57–1.00)
Glucose: 190 mg/dL — ABNORMAL HIGH (ref 70–99)
Potassium: 4.1 mmol/L (ref 3.5–5.2)
Sodium: 136 mmol/L (ref 134–144)
eGFR: 56 mL/min/{1.73_m2} — ABNORMAL LOW (ref >59)

## 2022-10-06 LAB — BRAIN NATRIURETIC PEPTIDE: BNP: 9.4 pg/mL (ref 0.0–100.0)

## 2022-10-06 LAB — TSH RFX ON ABNORMAL TO FREE T4: TSH: 0.526 u[IU]/mL (ref 0.450–4.500)

## 2022-10-06 NOTE — Addendum Note (Signed)
Addended by: Darnelle Spangle B on: 10/06/2022 01:49 PM   Modules accepted: Orders

## 2022-10-07 ENCOUNTER — Telehealth: Payer: Self-pay | Admitting: Pharmacist

## 2022-10-07 ENCOUNTER — Other Ambulatory Visit: Payer: 59

## 2022-10-07 NOTE — Telephone Encounter (Signed)
Patient called and reported that [facility] self-discontinued both Trulicity and Olmesartan and now feels much better.   [facility] wanted share this change.

## 2022-10-07 NOTE — Progress Notes (Signed)
Reviewed and agree with Dr Koval's plan.   

## 2022-10-08 NOTE — Telephone Encounter (Signed)
Reviewed and agree with Dr Koval's plan.   

## 2022-10-13 LAB — INTACT PTH (INCLUDES CALCIUM)
Calcium, Serum: 9.8 mg/dL
PTH (Intact Assay): 9 pg/mL — ABNORMAL LOW

## 2022-10-15 ENCOUNTER — Other Ambulatory Visit (HOSPITAL_COMMUNITY): Payer: Self-pay

## 2022-10-18 ENCOUNTER — Other Ambulatory Visit (HOSPITAL_COMMUNITY): Payer: Self-pay

## 2022-10-18 ENCOUNTER — Telehealth: Payer: Self-pay

## 2022-10-18 NOTE — Telephone Encounter (Signed)
A Prior Authorization was initiated for this patients TRULICITY through CoverMyMeds.   Key: [id number]

## 2022-10-18 NOTE — Telephone Encounter (Signed)
A Prior Authorization was initiated for this patients jardiance through CoverMyMeds.   Key: Ward Chatters

## 2022-10-18 NOTE — Telephone Encounter (Signed)
Prior Auth for patients medication JARDIANCE approved by [employer] from 10/15/22 to 10/17/23.  CoverMyMeds KeyWard Chatters PA Case ID #: [id number]

## 2022-10-21 ENCOUNTER — Other Ambulatory Visit (HOSPITAL_COMMUNITY): Payer: Self-pay

## 2022-10-26 ENCOUNTER — Ambulatory Visit (HOSPITAL_COMMUNITY)
Admission: RE | Admit: 2022-10-26 | Discharge: 2022-10-26 | Disposition: A | Discharge status: Home / Self Care | Payer: 59 | Source: Ambulatory Visit | Attending: Family Medicine | Admitting: Family Medicine

## 2022-10-28 ENCOUNTER — Other Ambulatory Visit (HOSPITAL_COMMUNITY): Payer: Self-pay

## 2022-11-01 ENCOUNTER — Other Ambulatory Visit (HOSPITAL_COMMUNITY): Payer: Self-pay

## 2022-11-01 ENCOUNTER — Ambulatory Visit (HOSPITAL_BASED_OUTPATIENT_CLINIC_OR_DEPARTMENT_OTHER): Payer: 59 | Admitting: Cardiology

## 2022-11-01 ENCOUNTER — Encounter (HOSPITAL_BASED_OUTPATIENT_CLINIC_OR_DEPARTMENT_OTHER): Payer: Self-pay | Admitting: Cardiology

## 2022-11-01 VITALS — BP 116/72 | HR 81 | Ht 67.0 in | Wt 197.1 lb

## 2022-11-01 DIAGNOSIS — E782 Mixed hyperlipidemia: Secondary | ICD-10-CM | POA: Diagnosis not present

## 2022-11-01 DIAGNOSIS — I10 Essential (primary) hypertension: Secondary | ICD-10-CM

## 2022-11-01 DIAGNOSIS — E119 Type 2 diabetes mellitus without complications: Secondary | ICD-10-CM

## 2022-11-01 DIAGNOSIS — Z7984 Long term (current) use of oral hypoglycemic drugs: Secondary | ICD-10-CM

## 2022-11-01 DIAGNOSIS — I5032 Chronic diastolic (congestive) heart failure: Secondary | ICD-10-CM | POA: Diagnosis not present

## 2022-11-01 DIAGNOSIS — Z7189 Other specified counseling: Secondary | ICD-10-CM

## 2022-11-01 MED ORDER — ROSUVASTATIN CALCIUM 20 MG PO TABS
20.0000 mg | ORAL_TABLET | Freq: Every day | ORAL | 3 refills | Status: DC
Start: 2022-11-01 — End: 2024-02-07
  Filled 2022-11-01 – 2022-11-12 (×2): qty 30, 30d supply, fill #0
  Filled 2023-02-27: qty 90, 90d supply, fill #0
  Filled 2023-05-27: qty 90, 90d supply, fill #1
  Filled 2023-10-15: qty 90, 90d supply, fill #2

## 2022-11-01 NOTE — Patient Instructions (Addendum)
Medication Instructions:  Your physician recommends that you continue on your current medications as directed. Please refer to the Current Medication list given to you today.  *If you need a refill on your cardiac medications before your next appointment, please call your pharmacy*  Follow-Up: At [facility], you and your health needs are our priority.  As part of our continuing mission to provide you with exceptional heart care, we have created designated Provider Care Teams.  These Care Teams include your primary Cardiologist (physician) and Advanced Practice Providers (APPs -  Physician Assistants and Nurse Practitioners) who all work together to provide you with the care you need, when you need it.  We recommend signing up for the patient portal called "MyChart".  Sign up information is provided on this After Visit Summary.  MyChart is used to connect with patients for Virtual Visits (Telemedicine).  Patients are able to view lab/test results, encounter notes, upcoming appointments, etc.  Non-urgent messages can be sent to your provider as well.   To learn more about what you can do with MyChart, go to [email or web address].    Your next appointment:   6 months with Dr. Cristal Deer   Other Instructions With your upcoming move, I would look into [facility] groups. You should not need a referral but if you have any issues just let me know.

## 2022-11-01 NOTE — Progress Notes (Signed)
Cardiology Office Note:  .    Date:  11/01/2022  ID:  Teresa Chang, DOB Aug 10, 1970, MRN [id number] PCP: Darral Dash, DO  [facility] Providers Cardiologist:  Jodelle Red, MD     History of Present Illness: .    Teresa Chang is a 52 y.o. female with a hx of chronic diastolic heart failure, hypertension, who is seen in follow up. She was initially seen as a new patient on 11/18/17 at the request of Dr. Sydnee Cabal for evaluation and management of lower extremity edema.    Cardiac history: Noted a 1 year history of weight gain. Was 170-180lbs, now weighs up to 220 lbs. Came on gradually. After taking HCTZ swelling improved but her clothes are still tight. Was on BP meds in the past (lisinopril-HCTZ) but stopped after she began exercising, was off medications for about a year until she had to restart HCTZ. Reviewed the results of her echo with her, which showed diastolic dysfunction.   At her visit 03/2021, she reported weight gain from 170-180s lbs, to 220 lbs. Initially, she thought the weight was related to fluid but she was compliant with Lasix. Her mother passed away that Summer. During the grieving process, she was eating more and forgot to take her medications causing her diabetes to worsen. Dr. Raymondo Band follows her for diabetes. He wanted her to taper her insulin use. She noted her sugar dropped to 70-80s in the evenings. She intended to self-decrease her Lantus to 12 units nightly. Occasionally had quick flutters in her chest. Given her weight gain, we discussed GLP1RA. I recommended she discuss with her endocrinologist as she was on insulin.   Today, she reports struggling with shortness of breath along with an illness last month. She was becoming dyspneic with walking minimal distances. At the time, her Trulicity had been increased. She stopped taking HCTZ and Trulicity, with subsequent improvement in her breathing. After a week off of the medications, she resumed Trulicity  at the 0.75 mg dose. At this dose, Trulicity has been working well for her. She has not resumed the HCTZ-Olmesartan since last month. Previously doing well on lisinopril. She hasn't been monitoring her home blood pressures as they have been stable.  She continues to have pain and swelling of her left arm. This has been an ongoing issue, followed by orthopedics and rheumatology. She has noticed that when her blood sugar is high, her symptoms seem to be more intense.  She denies any palpitations, chest pain, lightheadedness, headaches, syncope, orthopnea, or PND.  ROS:  Please see the history of present illness. ROS otherwise negative except as noted.  (+) Left arm pain/swelling  Studies Reviewed: Marland Kitchen    EKG Interpretation Date/Time:  Monday November 01 2022 10:58:51 EDT Ventricular Rate:  81 PR Interval:  152 QRS Duration:  78 QT Interval:  366 QTC Calculation: 425 R Axis:   30  Text Interpretation: Normal sinus rhythm Normal ECG When compared with ECG of 05-Oct-2022 11:18, No significant change was found Confirmed by Jodelle Red [phone]) on 11/01/2022 11:19:30 AM    Chest X-Ray  10/05/2022: FINDINGS: Cardiac silhouette is unremarkable. No pneumothorax or pleural effusion. The lungs are clear. The visualized skeletal structures are unremarkable.   IMPRESSION: No acute cardiopulmonary process.  Physical Exam:    VS:  BP 116/72 (BP Location: Right Arm, Patient Position: Sitting, Cuff Size: Large)   Pulse 81   Ht 5\' 7"  (1.702 m)   Wt 197 lb 1.6 oz (89.4 kg)  BMI 30.87 kg/m    Wt Readings from Last 3 Encounters:  11/01/22 197 lb 1.6 oz (89.4 kg)  10/05/22 189 lb 9.6 oz (86 kg)  09/28/22 190 lb 6 oz (86.4 kg)    GEN: Well nourished, well developed in no acute distress HEENT: Normal, moist mucous membranes NECK: No JVD CARDIAC: regular rhythm, normal S1 and S2, no rubs or gallops. No murmur. VASCULAR: Radial and DP pulses 2+ bilaterally. No carotid bruits RESPIRATORY:   Clear to auscultation without rales, wheezing or rhonchi  ABDOMEN: Soft, non-tender, non-distended MUSCULOSKELETAL:  Ambulates independently SKIN: Warm and dry NEUROLOGIC:  Alert and oriented x 3. No focal neuro deficits noted. PSYCHIATRIC:  Normal affect   ASSESSMENT AND PLAN: .    Chronic diastolic heart failure -borderline diastolic dysfunction on most recent echo -euvolemic today   Type II diabetes Hypertension -A1c initially >15, most recent 8.3 -on metformin, dulaglutide, empagliflozin. No longer on insulin -has not had microalbumin checked. Has been off olmesartan-hydrochlorothiazide for about a month Check ucr, consider arb if ucr abnormal. BP well controlled today, may not otherwise have room for ARB. May need to consider low dose losartan if ARB needed. Also tolerated lisinopril in the past.   Mixed hyperlipidemia: continue rosuvastatin   CV risk and prevention -recommend heart healthy/Mediterranean diet, with whole grains, fruits, vegetable, fish, lean meats, nuts, and olive oil. Limit salt. -recommend moderate walking, 3-5 times/week for 30-50 minutes each session. Aim for at least 150 minutes.week. Goal should be pace of 3 miles/hours, or walking 1.5 miles in 30 minutes -recommend avoidance of tobacco products. Avoid excess alcohol. -ASCVD risk score: The ASCVD Risk score (Arnett DK, et al., 2019) failed to calculate for the following reasons:   The valid total cholesterol range is 130 to 320 mg/dL   Dispo: Follow-up in 6 months, or sooner as needed.  I,Mathew Stumpf,acting as a Neurosurgeon for [employer], MD.,have documented all relevant documentation on the behalf of Jodelle Red, MD,as directed by  Jodelle Red, MD while in the presence of Jodelle Red, MD.  I, Jodelle Red, MD, have reviewed all documentation for this visit. The documentation on 11/01/22 for the exam, diagnosis, procedures, and orders are all accurate and  complete.   Signed, Jodelle Red, MD

## 2022-11-03 ENCOUNTER — Other Ambulatory Visit (HOSPITAL_COMMUNITY): Payer: Self-pay

## 2022-11-03 NOTE — Telephone Encounter (Signed)
PRIOR AUTH APPROVED.  Expiration date 09/20/23  Unsure of approval dates. Insurance prefers 90 day supply.   Copay $25 for 28 day supply.

## 2022-11-09 ENCOUNTER — Ambulatory Visit: Payer: 59 | Admitting: Pharmacist

## 2022-11-11 ENCOUNTER — Other Ambulatory Visit (HOSPITAL_COMMUNITY): Payer: Self-pay

## 2022-11-12 ENCOUNTER — Other Ambulatory Visit (HOSPITAL_COMMUNITY): Payer: Self-pay

## 2022-11-22 ENCOUNTER — Other Ambulatory Visit (HOSPITAL_COMMUNITY): Payer: Self-pay

## 2022-12-24 ENCOUNTER — Other Ambulatory Visit (HOSPITAL_COMMUNITY): Payer: Self-pay

## 2022-12-29 ENCOUNTER — Other Ambulatory Visit (HOSPITAL_COMMUNITY): Payer: Self-pay

## 2022-12-29 ENCOUNTER — Other Ambulatory Visit: Payer: Self-pay

## 2022-12-29 DIAGNOSIS — E119 Type 2 diabetes mellitus without complications: Secondary | ICD-10-CM

## 2022-12-29 MED ORDER — TRULICITY 1.5 MG/0.5ML ~~LOC~~ SOAJ
1.5000 mg | SUBCUTANEOUS | 3 refills | Status: DC
  Filled 2022-12-29: qty 2, 28d supply, fill #0

## 2022-12-29 NOTE — Telephone Encounter (Signed)
Patient calls nurse line regarding Trulicity refill. She is asking that we send refills to [facility] on [address]. Called pharmacy. They do not have medication in stock.   Patient requesting that Trulicity be sent to [facility] pharmacy.   Veronda Prude, RN

## 2022-12-30 ENCOUNTER — Other Ambulatory Visit (HOSPITAL_COMMUNITY): Payer: Self-pay

## 2022-12-30 MED ORDER — TRULICITY 0.75 MG/0.5ML ~~LOC~~ SOAJ
0.7500 mg | SUBCUTANEOUS | 3 refills | Status: DC
  Filled 2022-12-30: qty 2, 28d supply, fill #0

## 2023-01-12 ENCOUNTER — Other Ambulatory Visit (HOSPITAL_COMMUNITY): Payer: Self-pay

## 2023-01-14 ENCOUNTER — Other Ambulatory Visit: Payer: Self-pay

## 2023-01-14 DIAGNOSIS — E559 Vitamin D deficiency, unspecified: Secondary | ICD-10-CM

## 2023-01-14 DIAGNOSIS — E119 Type 2 diabetes mellitus without complications: Secondary | ICD-10-CM

## 2023-01-14 NOTE — Telephone Encounter (Signed)
Patient calls nurse line requesting refills. She reports that insurance will only cover medications through [facility].  Pended refills to this encounter.   Veronda Prude, RN

## 2023-01-15 MED ORDER — TRULICITY 0.75 MG/0.5ML ~~LOC~~ SOAJ
0.7500 mg | SUBCUTANEOUS | 3 refills | Status: DC
Start: 2023-01-15 — End: 2023-01-28

## 2023-01-15 MED ORDER — FLUTICASONE PROPIONATE 50 MCG/ACT NA SUSP: 2.0000 | Freq: Every day | NASAL | 6 refills | Status: DC

## 2023-01-15 MED ORDER — LORATADINE 10 MG PO TABS: 10.0000 mg | ORAL_TABLET | Freq: Every day | ORAL | 2 refills | Status: AC

## 2023-01-15 MED ORDER — METFORMIN HCL ER 500 MG PO TB24
1000.0000 mg | ORAL_TABLET | Freq: Every day | ORAL | 3 refills | Status: DC
Start: 2023-01-15 — End: 2023-01-21

## 2023-01-15 MED ORDER — VITAMIN D3 1.25 MG (50000 UT) PO CAPS: 1.0000 | ORAL_CAPSULE | ORAL | 3 refills | Status: DC

## 2023-01-20 ENCOUNTER — Other Ambulatory Visit: Payer: Self-pay

## 2023-01-21 ENCOUNTER — Telehealth: Payer: Self-pay

## 2023-01-21 DIAGNOSIS — E119 Type 2 diabetes mellitus without complications: Secondary | ICD-10-CM

## 2023-01-21 NOTE — Telephone Encounter (Signed)
[facility] calls nurse line in regards to Metformin prescription.   She reports (2) conflicting sets of directions.   Please resend with appropriate sig.

## 2023-01-25 MED ORDER — METFORMIN HCL ER 500 MG PO TB24
1000.0000 mg | ORAL_TABLET | Freq: Two times a day (BID) | ORAL | 3 refills | Status: DC
Start: 2023-01-25 — End: 2023-02-03

## 2023-01-28 ENCOUNTER — Other Ambulatory Visit: Payer: Self-pay

## 2023-01-28 DIAGNOSIS — E119 Type 2 diabetes mellitus without complications: Secondary | ICD-10-CM

## 2023-01-28 NOTE — Telephone Encounter (Signed)
Patient calls back requesting DM testing supplies as well.   Teresa Chang

## 2023-01-28 NOTE — Telephone Encounter (Signed)
Patient calls nurse line requesting a refill on Metformin and Trulicity.  She reports [facility] is not able to fill these two medications for her.   She is requesting they go to [facility].   Will forward to PCP.

## 2023-02-01 NOTE — Telephone Encounter (Signed)
Called patient and she states that she was in a research with Dr. Raymondo Band and they determined that the blood sugar monitor that she was using has been discontinued.  Patient needs and wants a new monitor in order to check blood sugar as she is on Metformin and Trulicity.  If there is a problem with the above request please contact Dr. Raymondo Band.  Please send all medications to [facility] pharmacy.  Patient is very upset because since she is diabetic she feels that she has to check her blood sugars daily.  All of her medications need to be refilled.  Glennie Hawk, CMA

## 2023-02-02 ENCOUNTER — Other Ambulatory Visit (HOSPITAL_COMMUNITY): Payer: Self-pay

## 2023-02-02 MED ORDER — TRULICITY 0.75 MG/0.5ML ~~LOC~~ SOAJ
0.7500 mg | SUBCUTANEOUS | 3 refills | Status: DC
  Filled 2023-02-02: qty 2, 28d supply, fill #0

## 2023-02-03 ENCOUNTER — Telehealth: Payer: Self-pay | Admitting: Pharmacist

## 2023-02-03 ENCOUNTER — Other Ambulatory Visit: Payer: Self-pay

## 2023-02-03 ENCOUNTER — Telehealth: Payer: Self-pay | Admitting: Student

## 2023-02-03 ENCOUNTER — Other Ambulatory Visit: Payer: Self-pay | Admitting: Student

## 2023-02-03 ENCOUNTER — Other Ambulatory Visit (HOSPITAL_COMMUNITY): Payer: Self-pay

## 2023-02-03 DIAGNOSIS — E119 Type 2 diabetes mellitus without complications: Secondary | ICD-10-CM

## 2023-02-03 MED ORDER — METFORMIN HCL ER 500 MG PO TB24
1000.0000 mg | ORAL_TABLET | Freq: Two times a day (BID) | ORAL | 3 refills | Status: DC
  Filled 2023-02-03: qty 120, 30d supply, fill #0

## 2023-02-03 MED ORDER — FLUTICASONE PROPIONATE 50 MCG/ACT NA SUSP
2.0000 | Freq: Every day | NASAL | 6 refills | Status: AC
  Filled 2023-02-03: qty 16, 30d supply, fill #0

## 2023-02-03 MED ORDER — TRULICITY 0.75 MG/0.5ML ~~LOC~~ SOAJ
0.7500 mg | SUBCUTANEOUS | 3 refills | Status: DC
  Filled 2023-02-03: qty 2, 28d supply, fill #0
  Filled 2023-02-27 – 2023-03-03 (×3): qty 2, 28d supply, fill #1

## 2023-02-03 MED ORDER — METFORMIN HCL 1000 MG PO TABS
1000.0000 mg | ORAL_TABLET | Freq: Two times a day (BID) | ORAL | 3 refills | Status: DC
  Filled 2023-02-03: qty 60, 30d supply, fill #0
  Filled 2023-02-27: qty 60, 30d supply, fill #1
  Filled 2023-05-27: qty 60, 30d supply, fill #2

## 2023-02-03 MED ORDER — ACCU-CHEK GUIDE W/DEVICE KIT
PACK | 0 refills | Status: DC
  Filled 2023-02-03: qty 1, 1d supply, fill #0
  Filled 2023-02-03: qty 1, 60d supply, fill #0
  Filled 2023-02-03: qty 1, 28d supply, fill #0
  Filled 2023-02-03: qty 1, 1d supply, fill #0

## 2023-02-03 MED ORDER — ACCU-CHEK SOFTCLIX LANCETS MISC
12 refills | Status: DC
  Filled 2023-02-03 (×3): qty 100, 30d supply, fill #0

## 2023-02-03 MED ORDER — ACCU-CHEK GUIDE VI STRP
ORAL_STRIP | Freq: Every day | 12 refills | Status: DC
  Filled 2023-02-03 (×3): qty 50, 30d supply, fill #0

## 2023-02-03 NOTE — Progress Notes (Signed)
Metformin 1000 mg BID sent to [facility]

## 2023-02-03 NOTE — Telephone Encounter (Signed)
Patient called upset about her medications. She needs [facility] taken completely out of her chart and ALL medications sent to [facility] pharmacy.   She is needing her Metformin ASAP (really needing it today)  She is also requesting her Trulicity and Flonase and also a Blood Sugar Kit sent into [facility].   Thanks! Griswold A Warrick

## 2023-02-03 NOTE — Telephone Encounter (Signed)
Called [place] outpatient pharmacy and confirmed they received Trulicity and Metformin prescriptions.   I called patient to inform her of this. Patient reported she has been waiting days for [facility] to send her in diabetic testing supplies. (See phone note from 10/22) I advised patient per what I could see [facility] did not feel she needed to check her blood sugars daily.  Patient began to become very angry about this. She began shouting making statements about [place] using common sense?   I asked patient to stop yelling at me and she stated "this is your profession you shouldn't take it personally."   Patient stated, "I am done with Dr. Melissa Noon."  Patient requests I send this to [facility] for DM testing supplies.

## 2023-02-03 NOTE — Telephone Encounter (Signed)
Patient contacted office for assistance with glucose checking.  We discussed a variety of issues including LIBRE3 has not been discontinued.  And glucometer testing is acceptable once study is over.   Scheduled for 6 month end of study visit for LIBERATE.   New prescriptions provided for  Glucometer, strips and lancets.   Total time with patient call and documentation of interaction: 14 minutes.  F/U Phone call planned: visit scheduled in 5 days (Monday)

## 2023-02-07 ENCOUNTER — Telehealth: Payer: Self-pay | Admitting: Pharmacist

## 2023-02-07 ENCOUNTER — Encounter: Payer: 59 | Admitting: Pharmacist

## 2023-02-07 NOTE — Telephone Encounter (Signed)
Patient contacted my office phone and shared that she had forgotten about AM visit today and requested call back to be seen later this week.   Returned call and reschedule for appointment in 2 days.  Patient appreciative of the rescheduling.     Total time with patient call and documentation of interaction: 7 minutes.

## 2023-02-09 ENCOUNTER — Encounter: Payer: 59 | Admitting: Pharmacist

## 2023-02-28 ENCOUNTER — Telehealth: Payer: Self-pay | Admitting: Pharmacist

## 2023-02-28 ENCOUNTER — Other Ambulatory Visit (HOSPITAL_COMMUNITY): Payer: Self-pay

## 2023-02-28 NOTE — Telephone Encounter (Signed)
Contacted patient to attempt to reschedule overdue LIBERATE 6 month end of study visit.   Patient plans to call the front desk to reschedule back-to-back appointments in order to see her PCP for complaints of cough and phlegm from a long-term cold.

## 2023-03-03 ENCOUNTER — Other Ambulatory Visit: Payer: Self-pay

## 2023-03-03 ENCOUNTER — Other Ambulatory Visit: Payer: Self-pay | Admitting: Pharmacist

## 2023-03-03 ENCOUNTER — Ambulatory Visit (INDEPENDENT_AMBULATORY_CARE_PROVIDER_SITE_OTHER): Payer: 59 | Admitting: Pharmacist

## 2023-03-03 ENCOUNTER — Other Ambulatory Visit (HOSPITAL_COMMUNITY): Payer: Self-pay

## 2023-03-03 ENCOUNTER — Telehealth: Payer: Self-pay | Admitting: Pharmacist

## 2023-03-03 VITALS — Wt 191.8 lb

## 2023-03-03 DIAGNOSIS — E782 Mixed hyperlipidemia: Secondary | ICD-10-CM | POA: Diagnosis not present

## 2023-03-03 DIAGNOSIS — E119 Type 2 diabetes mellitus without complications: Secondary | ICD-10-CM

## 2023-03-03 LAB — POCT GLYCOSYLATED HEMOGLOBIN (HGB A1C): HbA1c, POC (controlled diabetic range): 6.8 % (ref 0.0–7.0)

## 2023-03-03 LAB — HEMOGLOBIN A1C: Hemoglobin A1C: 6.8

## 2023-03-03 MED ORDER — TRULICITY 0.75 MG/0.5ML ~~LOC~~ SOAJ
0.7500 mg | SUBCUTANEOUS | 9 refills | Status: DC
  Filled 2023-03-03 – 2023-03-11 (×2): qty 2, 28d supply, fill #0

## 2023-03-03 MED ORDER — ACCU-CHEK SMARTVIEW VI STRP
1.0000 | ORAL_STRIP | Freq: Three times a day (TID) | 12 refills | Status: DC
  Filled 2023-03-03: qty 100, 34d supply, fill #0
  Filled 2023-06-29: qty 100, 34d supply, fill #1

## 2023-03-03 NOTE — Assessment & Plan Note (Signed)
ASCVD risk - in patient with diabetes. high intensity statin indicated.  Reinforced improvement with prior use from baseline.  -Restarted rosuvastatin 20 mg.

## 2023-03-03 NOTE — Telephone Encounter (Signed)
Patient calls, leaves voice mail - reports she has been sick.  She reports recovering from her illness and is interested in scheduling her missed follow-up appointment.   Contacted patient and offered appointment later today in my clinic.  Patient agreed to 3:30 PM appointment.    Total time with patient call and documentation of interaction: 11 minutes.

## 2023-03-03 NOTE — Patient Instructions (Signed)
It was nice to see you today!  Your A1C has improved greatly over the last 6 months.   9.5 has decreased to 6.8 today!!!  Your goal blood sugar is 80-130 before eating and less than 180 after eating.  Medication Changes: Restart Rosuvastatin 20mg  daily  New prescriptions for Trulicity and Test strips sent to your pharmacy.   Continue all other medication the same.

## 2023-03-03 NOTE — Progress Notes (Addendum)
    S:     Chief Complaint  Patient presents with   Medication Management    Liberate CGM - - 6 month   52 y.o. female who presents for diabetes evaluation, education, and management in the context of the LIBERATE Study.  PMH is significant for heart failure.  Patient was referred and last seen by Primary Care Provider, Dr. Marisue Humble, on 10/05/2022.   Today, patient called and requested appointment today.  She arrives in good spirits and presents without any assistance.   Current diabetes medications include: Trulicity (dulaglutide) 0.75mg  weekly   Patient reports adherence to taking all medications as prescribed. However she admits not taking statin.   Patient denies hypoglycemic events.    O:   Review of Systems  All other systems reviewed and are negative.   Physical Exam Constitutional:      Appearance: Normal appearance.  Pulmonary:     Effort: Pulmonary effort is normal.  Neurological:     Mental Status: She is alert.  Psychiatric:        Mood and Affect: Mood normal.        Thought Content: Thought content normal.      Lab Results  Component Value Date   HGBA1C 6.8 03/03/2023     Lipid Panel     Component Value Date/Time   CHOL 115 08/18/2021 1518   TRIG 106 08/18/2021 1518   HDL 41 08/18/2021 1518   CHOLHDL 2.8 08/18/2021 1518   CHOLHDL 4.3 05/25/2016 1620   VLDL 39 (H) 05/25/2016 1620   LDLCALC 54 08/18/2021 1518    Patient is participating in a Managed Medicaid Plan:  Yes     A/P:  LIBERATE Study:  - final study visit.   Diabetes longstanding currently significantly improved from A1C of 9.5 at start of study to 6.8 A1C today. Patient is apprehensive about use of SGLT2 therapy.  We discussed renal function, heart failure and agreed to gather more data on kidney function - prior to decision. -Continued GLP-1 Trulicity (dulaglutide) at 0.75mg  weekly.   -Continued metformin 1000mg  BID   -Extensively discussed pathophysiology of diabetes,  recommended lifestyle interventions, dietary effects on blood sugar control. - Obtain UACR today -Counseled on s/sx of and management of hypoglycemia.  - clarified glucometer strip prescription needed new prescription order.   ASCVD risk - in patient with diabetes. high intensity statin indicated.  Reinforced improvement with prior use from baseline.  -Restarted rosuvastatin 20 mg.   Written patient instructions provided. Patient verbalized understanding of treatment plan.  Total time in face to face counseling 29 minutes.    Follow-up:  Pharmacist - unknown - patient shared that she is shifting care to new [facility] provider next month. I shared with her that I cannot take referrals from providers outside of our practice. Marland Kitchen   UACR returned at 11 - Normal Defer SGLT2 at this time.

## 2023-03-03 NOTE — Assessment & Plan Note (Signed)
Diabetes longstanding currently significantly improved from A1C of 9.5 at start of study to 6.8 A1C today. Patient is apprehensive about use of SGLT2 therapy.  We discussed renal function, heart failure and agreed to gather more data on kidney function - prior to decision. -Continued GLP-1 Trulicity (dulaglutide) at 0.75mg  weekly.   -Continued metformin 1000mg  BID   -Extensively discussed pathophysiology of diabetes, recommended lifestyle interventions, dietary effects on blood sugar control. - Obtain UACR today -Counseled on s/sx of and management of hypoglycemia.  - clarified glucometer strip prescription needed new prescription order.

## 2023-03-04 ENCOUNTER — Other Ambulatory Visit (HOSPITAL_COMMUNITY): Payer: Self-pay

## 2023-03-04 NOTE — Progress Notes (Signed)
Reviewed and agree with Dr Koval's plan.   

## 2023-03-06 LAB — MICROALBUMIN / CREATININE URINE RATIO
Creatinine, Urine: 324 mg/dL
Microalb/Creat Ratio: 11 mg/g{creat} (ref 0–29)
Microalbumin, Urine: 36.4 ug/mL

## 2023-03-07 ENCOUNTER — Telehealth: Payer: Self-pay | Admitting: Pharmacist

## 2023-03-07 NOTE — Telephone Encounter (Signed)
Patient contacted for follow-up of UACR which was normal.   Since last contact patient reports doing well.  I shared result and we agreed to continue on current regimen (not start SGLT2) at this time.   Continue all current medications    Total time with patient call and documentation of interaction: 8 minutes.  F/U Phone call planned: None

## 2023-03-07 NOTE — Telephone Encounter (Signed)
-----   Message from [facility] McDiarmid sent at 03/07/2023 10:33 AM EST -----  ----- Message ----- From: Nell Range Lab Results In Sent: 03/06/2023   8:12 AM EST To: Leighton Roach McDiarmid, MD

## 2023-03-09 ENCOUNTER — Telehealth: Payer: Self-pay

## 2023-03-09 ENCOUNTER — Other Ambulatory Visit (HOSPITAL_COMMUNITY): Payer: Self-pay

## 2023-03-09 NOTE — Telephone Encounter (Signed)
Pharmacy Patient Advocate Encounter   Received notification from CoverMyMeds that prior authorization for TRULICITY is required/requested.  The patient is insured through [facility] MEDICAID .    PA required; PA submitted to above mentioned insurance via CoverMyMeds Key/confirmation #/EOC [facility]. Status is pending

## 2023-03-11 ENCOUNTER — Other Ambulatory Visit (HOSPITAL_COMMUNITY): Payer: Self-pay

## 2023-03-18 ENCOUNTER — Other Ambulatory Visit (HOSPITAL_COMMUNITY): Payer: Self-pay

## 2023-03-21 ENCOUNTER — Encounter (HOSPITAL_COMMUNITY): Payer: Self-pay | Admitting: Pharmacist

## 2023-03-21 ENCOUNTER — Other Ambulatory Visit (HOSPITAL_COMMUNITY): Payer: Self-pay

## 2023-03-21 ENCOUNTER — Ambulatory Visit (INDEPENDENT_AMBULATORY_CARE_PROVIDER_SITE_OTHER): Payer: Medicaid Other | Admitting: Family Medicine

## 2023-03-21 ENCOUNTER — Encounter: Payer: Self-pay | Admitting: Family Medicine

## 2023-03-21 VITALS — BP 142/98 | HR 86 | Temp 99.1°F | Ht 68.0 in | Wt 195.2 lb

## 2023-03-21 DIAGNOSIS — Z7985 Long-term (current) use of injectable non-insulin antidiabetic drugs: Secondary | ICD-10-CM

## 2023-03-21 DIAGNOSIS — L659 Nonscarring hair loss, unspecified: Secondary | ICD-10-CM

## 2023-03-21 DIAGNOSIS — Z1231 Encounter for screening mammogram for malignant neoplasm of breast: Secondary | ICD-10-CM

## 2023-03-21 DIAGNOSIS — Z1211 Encounter for screening for malignant neoplasm of colon: Secondary | ICD-10-CM

## 2023-03-21 DIAGNOSIS — I10 Essential (primary) hypertension: Secondary | ICD-10-CM

## 2023-03-21 DIAGNOSIS — E119 Type 2 diabetes mellitus without complications: Secondary | ICD-10-CM | POA: Diagnosis not present

## 2023-03-21 MED ORDER — SEMAGLUTIDE(0.25 OR 0.5MG/DOS) 2 MG/3ML ~~LOC~~ SOPN
0.2500 mg | PEN_INJECTOR | SUBCUTANEOUS | 2 refills | Status: DC
  Filled 2023-03-21: qty 3, 56d supply, fill #0
  Filled 2023-03-25: qty 3, 28d supply, fill #0
  Filled 2023-05-27: qty 3, 28d supply, fill #1
  Filled 2023-06-29: qty 3, 28d supply, fill #2

## 2023-03-21 NOTE — Assessment & Plan Note (Signed)
Patient could not increase her trulicity due to side effects. We discussed that she might achieve better weight loss with Ozempic and we discussed the risks/benefits of switching. Pt is agreeable to trying the ozempic. Will start her on 0.25 mg weekly and see her back in 3 months for A1C recheck, possibly increase at the next visit.

## 2023-03-21 NOTE — Assessment & Plan Note (Signed)
Pt not currently on any medication for this, states she was told she could stop the medication. I advised the patient that she should get a blood pressure cuff at home and watch her blood pressures -- if they remain elevated we may need to put her back on her medication.

## 2023-03-21 NOTE — Progress Notes (Signed)
New Patient Office Visit  Subjective    Patient ID: Teresa Chang, female    DOB: Sep 02, 1970  Age: 52 y.o. MRN: [id number]  CC:  Chief Complaint  Patient presents with   Establish Care    Pt reports she wants to lose weight, been thinking of her mother a lot this holiday. Lost mother in 2021. She mention she is not working at the moment. Interest in shingle vaccine and cologuard.     HPI Teresa Chang presents to establish care Patient was seeing Dr. Melissa Noon at [facility]. She reports a history of diabetes, is taking trulicity and metformin 1000 mg BID. States that she is very happy with this regimen. States that she tried to increase her trulicity in the past but it caused feeling of passing out/ low blood sugar so they reduced it back down. We discussed switching her to Ozempic and we discussed the risks/benefits of this, including additional weight loss and she is agreeable to trying the medication.  Elevated blood pressure-- pt states that she used to have a blood pressure issue, but states that it got better and she was taken off her BP medication. She reports she does not have a BP machine at home, has had normal readings in the past with her PCP. I reviewed past BP readings in the chart with her.   I reviewed her last set of labs, A1C, etc. I also reviewed her notes from the pharmacist and her last PCP. We reviewed health maintenance and she is due for mammogram and cologuard testing.    I have reviewed all aspects of the patient's medical history including social, family, and surgical history.  Current Outpatient Medications  Medication Instructions   Accu-Chek Softclix Lancets lancets use as directed   Blood Glucose Monitoring Suppl (ACCU-CHEK GUIDE) w/Device KIT USE AS DIRECTED   fluticasone (FLONASE) 50 MCG/ACT nasal spray 2 sprays, Each Nare, Daily   glucose blood (ACCU-CHEK SMARTVIEW) test strip Use as instructed - test three times daily   loratadine (CLARITIN) 10 mg, Oral,  Daily   metFORMIN (GLUCOPHAGE) 1,000 mg, Oral, 2 times daily with meals   rosuvastatin (CRESTOR) 20 MG tablet Take 1 tablet (20 mg total) by mouth daily for cholesterol   Semaglutide(0.25 or 0.5MG /DOS) 0.25 mg, Subcutaneous, Weekly   Vitamin D3 1.25 mg, Oral, Weekly    Past Medical History:  Diagnosis Date   Anxiety    Bipolar disorder (HCC)    Depression    Hypertension    Type 2 diabetes mellitus (HCC)     No past surgical history on file.  Family History  Problem Relation Age of Onset   Diabetes Mother    Stroke Maternal Grandmother     Social History   Socioeconomic History   Marital status: Single    Spouse name: Not on file   Number of children: Not on file   Years of education: Not on file   Highest education level: Not on file  Occupational History   Not on file  Tobacco Use   Smoking status: Former    Current packs/day: 0.30    Types: Cigarettes   Smokeless tobacco: Never  Vaping Use   Vaping status: Never Used  Substance and Sexual Activity   Alcohol use: No   Drug use: No   Sexual activity: Not on file  Other Topics Concern   Not on file  Social History Narrative   Not on file   Social Determinants of Health  Financial Resource Strain: Low Risk  (03/21/2023)   Overall Financial Resource Strain (CARDIA)    Difficulty of Paying Living Expenses: Not hard at all  Food Insecurity: No Food Insecurity (03/21/2023)   Hunger Vital Sign    Worried About Running Out of Food in the Last Year: Never true    Ran Out of Food in the Last Year: Never true  Transportation Needs: Not on file  Physical Activity: Inactive (03/21/2023)   Exercise Vital Sign    Days of Exercise per Week: 0 days    Minutes of Exercise per Session: 0 min  Stress: No Stress Concern Present (03/21/2023)   [employer] of Occupational Health - Occupational Stress Questionnaire    Feeling of Stress : Not at all  Social Connections: Moderately Integrated (03/21/2023)   Social  Connection and Isolation Panel [NHANES]    Frequency of Communication with Friends and Family: More than three times a week    Frequency of Social Gatherings with Friends and Family: More than three times a week    Attends Religious Services: More than 4 times per year    Active Member of [employer] or Organizations: Yes    Attends Engineer, structural: More than 4 times per year    Marital Status: Never married  Intimate Partner Violence: Not on file    Review of Systems  All other systems reviewed and are negative.       Objective    BP (!) 142/98 (BP Location: Left Arm, Patient Position: Sitting, Cuff Size: Large)   Pulse 86   Temp 99.1 F (37.3 C) (Oral)   Ht 5\' 8"  (1.727 m)   Wt 195 lb 3.2 oz (88.5 kg)   LMP 03/07/2023 (Approximate)   SpO2 98%   BMI 29.68 kg/m   Physical Exam Vitals reviewed.  Constitutional:      Appearance: Normal appearance. She is well-groomed and overweight.  Eyes:     Conjunctiva/sclera: Conjunctivae normal.  Cardiovascular:     Rate and Rhythm: Normal rate and regular rhythm.     Pulses: Normal pulses.     Heart sounds: S1 normal and S2 normal.  Pulmonary:     Effort: Pulmonary effort is normal.     Breath sounds: Normal breath sounds and air entry.  Abdominal:     General: Bowel sounds are normal.  Musculoskeletal:     Right lower leg: No edema.     Left lower leg: No edema.  Neurological:     Mental Status: She is alert and oriented to person, place, and time. Mental status is at baseline.     Gait: Gait is intact.  Psychiatric:        Mood and Affect: Mood and affect normal.        Speech: Speech normal.        Behavior: Behavior normal.        Judgment: Judgment normal.    Diabetic Foot Exam - Simple   Simple Foot Form Diabetic Foot exam was performed with the following findings: Yes 03/21/2023 12:10 PM  Visual Inspection No deformities, no ulcerations, no other skin breakdown bilaterally: Yes Sensation Testing Intact  to touch and monofilament testing bilaterally: Yes Pulse Check Posterior Tibialis and Dorsalis pulse intact bilaterally: Yes Comments      Last metabolic panel Lab Results  Component Value Date   GLUCOSE 190 (H) 10/05/2022   NA 136 10/05/2022   K 4.1 10/05/2022   CL 100 10/05/2022   CO2 21  10/05/2022   BUN 17 10/05/2022   CREATININE 1.16 (H) 10/05/2022   EGFR 56 (L) 10/05/2022   CALCIUM 10.5 (H) 10/05/2022   PROT 6.7 10/30/2020   ALBUMIN 3.7 10/30/2020   LABGLOB 2.3 09/10/2020   AGRATIO 1.7 09/10/2020   BILITOT 0.4 10/30/2020   ALKPHOS 67 10/30/2020   AST 18 10/30/2020   ALT 12 10/30/2020   ANIONGAP 11 10/30/2020        Assessment & Plan:  Type 2 diabetes mellitus without complication, without long-term current use of insulin (HCC) Assessment & Plan: Patient could not increase her trulicity due to side effects. We discussed that she might achieve better weight loss with Ozempic and we discussed the risks/benefits of switching. Pt is agreeable to trying the ozempic. Will start her on 0.25 mg weekly and see her back in 3 months for A1C recheck, possibly increase at the next visit.  Orders: -     Semaglutide(0.25 or 0.5MG /DOS); Inject 0.25 mg into the skin once a week.  Dispense: 3 mL; Refill: 2  Colon cancer screening -     Cologuard  Breast cancer screening by mammogram -     3D Screening Mammogram, Left and Right; Future  Hair loss Assessment & Plan: Pt mentioned this problem at the end of the visit, I recommend she start OTC monoxidil topical 5% applied daily.    Benign essential hypertension Assessment & Plan: Pt not currently on any medication for this, states she was told she could stop the medication. I advised the patient that she should get a blood pressure cuff at home and watch her blood pressures -- if they remain elevated we may need to put her back on her medication.     I spent 45 minutes with the patient today reviewing previous progress notes,  medications, labs, etc. Return in about 3 months (around 06/19/2023) for DM -- follow up A1C.   Karie Georges, MD

## 2023-03-21 NOTE — Assessment & Plan Note (Signed)
Pt mentioned this problem at the end of the visit, I recommend she start OTC monoxidil topical 5% applied daily.

## 2023-03-21 NOTE — Patient Instructions (Signed)
Hydration, probiotics, fiber will help keep bowels regular

## 2023-03-22 ENCOUNTER — Other Ambulatory Visit (HOSPITAL_COMMUNITY): Payer: Self-pay

## 2023-03-22 ENCOUNTER — Telehealth: Payer: Self-pay

## 2023-03-22 NOTE — Telephone Encounter (Signed)
Pharmacy Patient Advocate Encounter   Received notification from Physician's Office that prior authorization for Ozempic is required/requested.   Insurance verification completed.   The patient is insured through [facility] .   Per test claim: PA required; PA submitted to above mentioned insurance via CoverMyMeds Key/confirmation #/EOC Key: [id number]   Status is pending

## 2023-03-25 ENCOUNTER — Other Ambulatory Visit (HOSPITAL_COMMUNITY): Payer: Self-pay

## 2023-03-25 ENCOUNTER — Other Ambulatory Visit: Payer: Self-pay

## 2023-03-25 NOTE — Telephone Encounter (Signed)
Pharmacy Patient Advocate Encounter  Received notification from [facility] that Prior Authorization for Ozempic has been APPROVED from 12.10.24 to 12.10.25. Ran test claim, and The Rx has recently been filled.   This test claim was processed through [facility]- copay amounts may vary at other pharmacies due to pharmacy/plan contracts, or as the patient moves through the different stages of their insurance plan.   PA #/Case ID/Reference #: Key: [id number]

## 2023-03-25 NOTE — Telephone Encounter (Signed)
Spoke with Rosanne Ashing, pharmacist at [facility] and informed him of the approval as below.

## 2023-03-28 ENCOUNTER — Other Ambulatory Visit (HOSPITAL_COMMUNITY): Payer: Self-pay

## 2023-04-12 NOTE — Telephone Encounter (Signed)
 Pharmacy Patient Advocate Encounter  Received notification from [facility] MEDICAID that Prior Authorization for TRULICITY has been APPROVED from 03/09/23 to 03/08/24   PA #/Case ID/Reference #: [id number]

## 2023-04-18 ENCOUNTER — Encounter: Payer: Self-pay | Admitting: Internal Medicine

## 2023-04-18 ENCOUNTER — Ambulatory Visit (INDEPENDENT_AMBULATORY_CARE_PROVIDER_SITE_OTHER): Payer: Medicaid Other | Admitting: Internal Medicine

## 2023-04-18 ENCOUNTER — Other Ambulatory Visit (HOSPITAL_COMMUNITY)
Admission: RE | Admit: 2023-04-18 | Discharge: 2023-04-18 | Disposition: A | Discharge status: Home / Self Care | Payer: Medicaid Other | Source: Ambulatory Visit | Attending: Internal Medicine | Admitting: Internal Medicine

## 2023-04-18 VITALS — BP 130/84 | HR 64 | Temp 98.4°F | Wt 192.9 lb

## 2023-04-18 DIAGNOSIS — Z113 Encounter for screening for infections with a predominantly sexual mode of transmission: Secondary | ICD-10-CM

## 2023-04-18 NOTE — Progress Notes (Signed)
     Established Patient Office Visit     CC/Reason for Visit: Requesting STD screening  HPI: Teresa Chang is a 53 y.o. female who is coming in today for the above mentioned reasons.  Slight brownish discharge is her main concern.  She is requesting full STD screening.   Past Medical/Surgical History: Past Medical History:  Diagnosis Date   Anxiety    Bipolar disorder (HCC)    Depression    Hypertension    Type 2 diabetes mellitus (HCC)     History reviewed. No pertinent surgical history.  Social History:  reports that she has quit smoking. Her smoking use included cigarettes. She has never used smokeless tobacco. She reports that she does not drink alcohol and does not use drugs.  Allergies: No Known Allergies  Family History:  Family History  Problem Relation Age of Onset   Diabetes Mother    Stroke Maternal Grandmother      Current Outpatient Medications:    Accu-Chek Softclix Lancets lancets, use as directed, Disp: 100 each, Rfl: 12   Blood Glucose Monitoring Suppl (ACCU-CHEK GUIDE) w/Device KIT, USE AS DIRECTED, Disp: 1 kit, Rfl: 0   Cholecalciferol (VITAMIN D3) 1.25 MG (50000 UT) CAPS, Take 1 capsule (1.25 mg total) by mouth once a week., Disp: 12 capsule, Rfl: 3   fluticasone  (FLONASE ) 50 MCG/ACT nasal spray, Place 2 sprays into both nostrils daily., Disp: 16 g, Rfl: 6   glucose blood (ACCU-CHEK SMARTVIEW) test strip, Use as instructed - test three times daily, Disp: 100 each, Rfl: 12   loratadine  (CLARITIN ) 10 MG tablet, Take 1 tablet (10 mg total) by mouth daily., Disp: 30 tablet, Rfl: 2   metFORMIN  (GLUCOPHAGE ) 1000 MG tablet, Take 1 tablet (1,000 mg total) by mouth 2 (two) times daily with a meal., Disp: 60 tablet, Rfl: 3   rosuvastatin  (CRESTOR ) 20 MG tablet, Take 1 tablet (20 mg total) by mouth daily for cholesterol, Disp: 90 tablet, Rfl: 3   Semaglutide ,0.25 or 0.5MG /DOS, 2 MG/3ML SOPN, Inject 0.25 mg into the skin once a week., Disp: 3 mL, Rfl:  2  Review of Systems:  Negative unless indicated in HPI.   Physical Exam: Vitals:   04/18/23 1454  BP: 130/84  Pulse: 64  Temp: 98.4 F (36.9 C)  TempSrc: Oral  SpO2: 98%  Weight: 192 lb 14.4 oz (87.5 kg)    Body mass index is 29.33 kg/m.   Physical Exam Exam conducted with a chaperone present.  Genitourinary:    Pubic Area: No rash.      Vagina: Vaginal discharge present.     Cervix: Normal.      Impression and Plan:  Screening examination for STD (sexually transmitted disease) -     Cervicovaginal ancillary only -     Hepatitis panel, acute; Future -     HIV Antibody (routine testing w rflx); Future -     RPR; Future     Time spent:31 minutes reviewing chart, interviewing and examining patient and formulating plan of care.     Tully Theophilus Andrews, MD [facility]

## 2023-04-20 ENCOUNTER — Other Ambulatory Visit (HOSPITAL_COMMUNITY): Payer: Self-pay

## 2023-04-20 ENCOUNTER — Other Ambulatory Visit: Payer: Self-pay | Admitting: Internal Medicine

## 2023-04-20 DIAGNOSIS — B9689 Other specified bacterial agents as the cause of diseases classified elsewhere: Secondary | ICD-10-CM

## 2023-04-20 LAB — CERVICOVAGINAL ANCILLARY ONLY
Bacterial Vaginitis (gardnerella): POSITIVE — AB
Candida Glabrata: NEGATIVE
Candida Vaginitis: NEGATIVE
Chlamydia: NEGATIVE
Comment: NEGATIVE
Comment: NEGATIVE
Comment: NEGATIVE
Comment: NEGATIVE
Comment: NEGATIVE
Comment: NORMAL
Neisseria Gonorrhea: NEGATIVE
Trichomonas: NEGATIVE

## 2023-04-20 LAB — HEPATITIS PANEL, ACUTE
Hep A IgM: NONREACTIVE
Hep B C IgM: NONREACTIVE
Hepatitis B Surface Ag: NONREACTIVE
Hepatitis C Ab: NONREACTIVE

## 2023-04-20 LAB — RPR: RPR Ser Ql: NONREACTIVE

## 2023-04-20 LAB — HIV ANTIBODY (ROUTINE TESTING W REFLEX): HIV 1&2 Ab, 4th Generation: NONREACTIVE

## 2023-04-20 MED ORDER — METRONIDAZOLE 500 MG PO TABS
2000.0000 mg | ORAL_TABLET | Freq: Once | ORAL | 0 refills | Status: AC
  Filled 2023-04-20: qty 4, 1d supply, fill #0

## 2023-04-21 ENCOUNTER — Ambulatory Visit: Payer: Medicaid Other

## 2023-05-02 ENCOUNTER — Encounter: Payer: Self-pay | Admitting: Internal Medicine

## 2023-06-20 ENCOUNTER — Ambulatory Visit: Payer: Medicaid Other | Admitting: Family Medicine

## 2023-06-29 ENCOUNTER — Other Ambulatory Visit (HOSPITAL_COMMUNITY): Payer: Self-pay

## 2023-07-07 ENCOUNTER — Other Ambulatory Visit: Payer: Self-pay

## 2023-07-11 ENCOUNTER — Other Ambulatory Visit (HOSPITAL_COMMUNITY): Payer: Self-pay

## 2023-07-18 ENCOUNTER — Other Ambulatory Visit: Payer: Self-pay

## 2023-07-18 ENCOUNTER — Encounter: Payer: Self-pay | Admitting: Family Medicine

## 2023-07-18 ENCOUNTER — Other Ambulatory Visit (HOSPITAL_COMMUNITY): Payer: Self-pay

## 2023-07-18 ENCOUNTER — Ambulatory Visit (INDEPENDENT_AMBULATORY_CARE_PROVIDER_SITE_OTHER): Admitting: Family Medicine

## 2023-07-18 VITALS — BP 130/88 | HR 105 | Temp 98.8°F | Ht 68.0 in | Wt 190.7 lb

## 2023-07-18 DIAGNOSIS — D219 Benign neoplasm of connective and other soft tissue, unspecified: Secondary | ICD-10-CM | POA: Diagnosis not present

## 2023-07-18 DIAGNOSIS — N921 Excessive and frequent menstruation with irregular cycle: Secondary | ICD-10-CM

## 2023-07-18 DIAGNOSIS — Z111 Encounter for screening for respiratory tuberculosis: Secondary | ICD-10-CM

## 2023-07-18 DIAGNOSIS — E119 Type 2 diabetes mellitus without complications: Secondary | ICD-10-CM

## 2023-07-18 DIAGNOSIS — Z7985 Long-term (current) use of injectable non-insulin antidiabetic drugs: Secondary | ICD-10-CM | POA: Diagnosis not present

## 2023-07-18 LAB — POCT GLYCOSYLATED HEMOGLOBIN (HGB A1C): Hemoglobin A1C: 6.8 % — AB (ref 4.0–5.6)

## 2023-07-18 MED ORDER — LEVONORGESTREL-ETHINYL ESTRAD 0.1-20 MG-MCG PO TABS
1.0000 | ORAL_TABLET | Freq: Every day | ORAL | 3 refills | Status: DC
  Filled 2023-07-18: qty 84, 84d supply, fill #0

## 2023-07-18 MED ORDER — SEMAGLUTIDE(0.25 OR 0.5MG/DOS) 2 MG/3ML ~~LOC~~ SOPN
0.5000 mg | PEN_INJECTOR | SUBCUTANEOUS | 5 refills | Status: DC
  Filled 2023-07-18: qty 3, 28d supply, fill #0
  Filled 2023-08-13: qty 3, 28d supply, fill #1
  Filled 2023-10-15: qty 3, 28d supply, fill #2
  Filled 2023-11-17 – 2023-12-14 (×2): qty 3, 28d supply, fill #3
  Filled 2024-02-07: qty 3, 28d supply, fill #4
  Filled 2024-03-08: qty 3, 28d supply, fill #5

## 2023-07-18 NOTE — Patient Instructions (Signed)
 Diclofenac 1% gel -- apply up to 4 times daily.

## 2023-07-18 NOTE — Progress Notes (Unsigned)
 Established Patient Office Visit  Subjective   Patient ID: Teresa Chang, female    DOB: Apr 07, 1971  Age: 53 y.o. MRN: [id number]  Chief Complaint  Patient presents with   Medical Management of Chronic Issues    Pt is here for follow up on her diabetes today. She reports that she really likes the Ozempic, states that she was having side effects from the metformin, states it was making her sick so she stopped the metformin.   Pt reports that she is having periods every 2 weeks now -- states that the blood is darker and unpredictable, states she is wondering if she is going through menopause. States she is getting hot flashes as well at times.   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     Current Outpatient Medications  Medication Instructions   Accu-Chek Softclix Lancets lancets use as directed   Blood Glucose Monitoring Suppl (ACCU-CHEK GUIDE) w/Device KIT USE AS DIRECTED   fluticasone (FLONASE) 50 MCG/ACT nasal spray 2 sprays, Each Nare, Daily   glucose blood (ACCU-CHEK SMARTVIEW) test strip Use as instructed - test three times daily   levonorgestrel-ethinyl estradiol (ALESSE) 0.1-20 MG-MCG tablet 1 tablet, Oral, Daily   loratadine (CLARITIN) 10 mg, Oral, Daily   rosuvastatin (CRESTOR) 20 MG tablet Take 1 tablet (20 mg total) by mouth daily for cholesterol   Semaglutide(0.25 or 0.5MG /DOS) 0.5 mg, Subcutaneous, Weekly   Vitamin D3 1.25 mg, Oral, Weekly    Patient Active Problem List   Diagnosis Date Noted   Allergic rhinitis 02/18/2022   Swelling of left wrist 12/01/2021   Health maintenance examination 08/19/2021   Vitamin D deficiency 03/09/2021   Hair loss 03/09/2021   Left carpal tunnel syndrome 12/22/2020   Encounter for hepatitis C screening test for low risk patient 12/22/2020   Microscopic hematuria 08/18/2020   Chronic  diastolic heart failure (HCC) 06/20/2018   Mixed hyperlipidemia 06/02/2018   Type 2 diabetes mellitus without complication, without long-term current use of insulin (HCC) 04/06/2018   Dyspnea on exertion 02/19/2011   Fatigue 02/19/2011   Benign essential hypertension 04/04/2007   OBESITY, NOS 06/09/2006   OSTEOARTHRITIS, MULTI SITES 06/09/2006      Review of Systems  All other systems reviewed and are negative.     Objective:     BP 130/88   Pulse (!) 105   Temp 98.8 F (37.1 C) (Oral)   Ht 5\' 8"  (1.727 m)   Wt 190 lb 11.2 oz (86.5 kg)   LMP 07/07/2023 (Exact Date)   SpO2 98%   BMI 29.00 kg/m  {Vitals History (Optional):23777}  Physical Exam Vitals reviewed.  Constitutional:      Appearance: Normal appearance. She is well-groomed and normal weight.  Eyes:     Conjunctiva/sclera: Conjunctivae normal.  Neck:     Thyroid: No thyromegaly.  Cardiovascular:     Rate and Rhythm: Normal rate and regular rhythm.     Pulses: Normal pulses.     Heart sounds: S1 normal and S2 normal.  Pulmonary:     Effort: Pulmonary effort is normal.     Breath sounds: Normal breath sounds and air entry.  Abdominal:     General: Bowel sounds are normal.  Musculoskeletal:     Right lower leg: No edema.     Left lower leg: No edema.  Neurological:     Mental Status: She is alert and oriented to person, place, and time. Mental status is at baseline.     Gait: Gait is intact.  Psychiatric:        Mood and Affect: Mood and affect normal.        Speech: Speech normal.        Behavior: Behavior normal.        Judgment: Judgment normal.      Results for orders placed or performed in visit on 07/18/23  POC HgB A1c  Result Value Ref Range   Hemoglobin A1C 6.8 (A) 4.0 - 5.6 %   HbA1c POC (<> result, manual entry)     HbA1c, POC (prediabetic range)     HbA1c, POC (controlled diabetic range)      {Labs (Optional):23779}  The ASCVD Risk score (Arnett DK, et al., 2019) failed to  calculate for the following reasons:   The valid total cholesterol range is 130 to 320 mg/dL    Assessment & Plan:  Type 2 diabetes mellitus without complication, without long-term current use of insulin (HCC) -     POCT glycosylated hemoglobin (Hb A1C) -     Collection capillary blood specimen -     Semaglutide(0.25 or 0.5MG /DOS); Inject 0.5 mg into the skin once a week.  Dispense: 3 mL; Refill: 5  Metrorrhagia -     US PELVIS (TRANSABDOMINAL ONLY); Future -     Levonorgestrel-Ethinyl Estrad; Take 1 tablet by mouth daily.  Dispense: 84 tablet; Refill: 3     Return in about 6 months (around 01/17/2024) for DM -- come fasting for bloodwork. Karie Georges, MD

## 2023-07-19 NOTE — Assessment & Plan Note (Signed)
 A1C is stable from previous, we discussed leaving off the metformin and increasing the ozempic and she is agreeable, will increase to 0.5 mg weekly.

## 2023-07-20 ENCOUNTER — Telehealth: Payer: Self-pay | Admitting: *Deleted

## 2023-07-20 ENCOUNTER — Ambulatory Visit

## 2023-07-20 ENCOUNTER — Telehealth: Payer: Self-pay | Admitting: Family Medicine

## 2023-07-20 DIAGNOSIS — N921 Excessive and frequent menstruation with irregular cycle: Secondary | ICD-10-CM

## 2023-07-20 LAB — TB SKIN TEST
Induration: 0 mm
TB Skin Test: NEGATIVE

## 2023-07-20 NOTE — Progress Notes (Signed)
 PPD Reading Note  PPD read and results entered in EpicCare.  Result: 0 mm induration.  Interpretation: negative  If test not read within 48-72 hours of initial placement, patient advised to repeat in other arm 1-3 weeks after this test.  Allergic reaction: no

## 2023-07-20 NOTE — Telephone Encounter (Signed)
 Patient stated Pharmacist said to change medication to:  Charm Rings or Quasenge to avoid having issues instead of:  levonorgestrel-ethinyl estradiol (ALESSE) 0.1-20 MG-MCG tablet.  Please advise at [phone]

## 2023-07-20 NOTE — Telephone Encounter (Signed)
 Patient came in as a nurse's visit for TB skin test reading.  Patient was advised it is too early as the TB skin test was given on 4/7 in the afternoon and it has not been the full 48-72 hours yet.  Patient was reminded that I informed her to arrive on Wednesday afternoon around 3pm or later in order to not have to repeat the test.  Patient stated she cannot come in this afternoon or tomorrow morning and was advised to call back and reschedule the test.

## 2023-07-21 ENCOUNTER — Other Ambulatory Visit (HOSPITAL_COMMUNITY): Payer: Self-pay

## 2023-07-21 MED ORDER — LEVONORGEST-ETH ESTRAD 91-DAY 0.15-0.03 MG PO TABS
1.0000 | ORAL_TABLET | Freq: Every day | ORAL | 4 refills | Status: DC
  Filled 2023-07-21: qty 91, 91d supply, fill #0

## 2023-07-21 NOTE — Telephone Encounter (Signed)
 Rx sent

## 2023-07-25 ENCOUNTER — Other Ambulatory Visit

## 2023-08-03 ENCOUNTER — Ambulatory Visit: Payer: Self-pay | Admitting: *Deleted

## 2023-08-03 NOTE — Telephone Encounter (Signed)
  Chief Complaint: abnormal vaginal bleeding Symptoms: vaginal bleeding Frequency: two days ago Pertinent Negatives: Patient denies CP, SOB,  Disposition: [] ED /[] Urgent Care (no appt availability in office) / [] Appointment(In office/virtual)/ []  [facility]/ [] Home Care/ [] Refused Recommended Disposition /[] [facility]/ [x]  Follow-up with PCP Additional Notes: patient calling with reports of vaginal bleeding that started two days ago. Patient reports she is still on the hormone replacement that she was originally given in the office. Patient hasn't picked up the new order from the pharmacy. Patient calling with concerns that she is bleeding again. Patient also wanted to let MD know that her ultrasound is scheduled for this Friday. Patient is asking for follow up call in regards to the bleeding having been on hormone replacement    Reason for Disposition  [1] Bleeding or spotting between regular periods AND [2] occurs three cycles (3 months) or less this past year  Answer Assessment - Initial Assessment Questions 1. AMOUNT: "Describe the bleeding that you are having."    - SPOTTING: spotting, or pinkish / brownish mucous discharge; does not fill panty liner or pad    - MILD:  less than 1 pad / hour; less than patient's usual menstrual bleeding   - MODERATE: 1-2 pads / hour; 1 menstrual cup every 6 hours; small-medium blood clots (e.g., pea, grape, small coin)   - SEVERE: soaking 2 or more pads/hour for 2 or more hours; 1 menstrual cup every 2 hours; bleeding not contained by pads or continuous red blood from vagina; large blood clots (e.g., golf ball, large coin)      Mild 2. ONSET: "When did the bleeding begin?" "Is it continuing now?"     Started two days ago 3. MENSTRUAL PERIOD: "When was the last normal menstrual period?" "How is this different than your period?"     Abnormal bleeding 5. ABDOMEN PAIN: "Do you have any pain?" "How bad is the pain?"  (e.g., Scale  1-10; mild, moderate, or severe)   - MILD (1-3): doesn't interfere with normal activities, abdomen soft and not tender to touch    - MODERATE (4-7): interferes with normal activities or awakens from sleep, abdomen tender to touch    - SEVERE (8-10): excruciating pain, doubled over, unable to do any normal activities      8 out of 10 8. HORMONE MEDICINES: "Are you taking any hormone medicines, prescription or over-the-counter?" (e.g., birth control pills, estrogen)     Hormone replacement 9. BLOOD THINNER MEDICINES: "Do you take any blood thinners?" (e.g., Coumadin / warfarin, Pradaxa / dabigatran, aspirin)     no 10. CAUSE: "What do you think is causing the bleeding?" (e.g., recent gyn surgery, recent gyn procedure; known bleeding disorder, cervical cancer, polycystic ovarian disease, fibroids)          11. HEMODYNAMIC STATUS: "Are you weak or feeling lightheaded?" If Yes, ask: "Can you stand and walk normally?"        no 12. OTHER SYMPTOMS: "What other symptoms are you having with the bleeding?" (e.g., passed tissue, vaginal discharge, fever, menstrual-type cramps)       no  Protocols used: Vaginal Bleeding - Abnormal-A-AH

## 2023-08-03 NOTE — Telephone Encounter (Signed)
  2nd attempt, no answer. Left voicemail for patient to return call for nurse triage.  Copied from CRM [id number]. Topic: Clinical - Medication Question >> Aug 03, 2023  1:42 PM Shereese L wrote: Reason for CRM: [phone] Last row of white pills and menstrual came on and it while still taken birth control Never started the new birth control (levonorgestrel -ethinyl estradiol  (ALESSE) 0.1-20 MG-MCG)because she wanted to use the  Old pack prescribed Also advised Appt imaging doctor is 04/25 for ultrasound

## 2023-08-03 NOTE — Telephone Encounter (Signed)
 Called patient to review sx of starting menstrual cycle while taking birth control. No answer, LVMTCB [phone].

## 2023-08-03 NOTE — Telephone Encounter (Signed)
 Copied from CRM [id number]. Topic: Clinical - Medication Question >> Aug 03, 2023  1:42 PM Teresa Chang wrote: Reason for CRM: [phone] Last row of white pills and menstrual came on and it while still taken birth control Never started the new birth control (levonorgestrel -ethinyl estradiol  (ALESSE) 0.1-20 MG-MCG)because she wanted to use the  Old pack prescribed Also advised Appt imaging doctor is 04/25 for ultrasound

## 2023-08-04 ENCOUNTER — Other Ambulatory Visit

## 2023-08-04 NOTE — Telephone Encounter (Signed)
 Patient informed of the message below.  Message sent to PCP as patient declined the referral to GYN as she prefers to await US  results.

## 2023-08-04 NOTE — Telephone Encounter (Signed)
 Ok I can't do much until we know the results of her ultrasound-- would she like a referral to the GYN?

## 2023-08-05 ENCOUNTER — Ambulatory Visit
Admission: RE | Admit: 2023-08-05 | Discharge: 2023-08-05 | Disposition: A | Discharge status: Home / Self Care | Source: Ambulatory Visit | Attending: Family Medicine | Admitting: Family Medicine

## 2023-08-05 DIAGNOSIS — N921 Excessive and frequent menstruation with irregular cycle: Secondary | ICD-10-CM

## 2023-08-08 ENCOUNTER — Encounter: Payer: Self-pay | Admitting: Family Medicine

## 2023-08-08 NOTE — Addendum Note (Signed)
 Addended by: Aida House on: 08/08/2023 02:38 PM   Modules accepted: Orders

## 2023-08-17 ENCOUNTER — Encounter: Payer: Self-pay | Admitting: Radiology

## 2023-08-17 ENCOUNTER — Other Ambulatory Visit (HOSPITAL_COMMUNITY)
Admission: RE | Admit: 2023-08-17 | Discharge: 2023-08-17 | Disposition: A | Discharge status: Home / Self Care | Source: Ambulatory Visit | Attending: Radiology | Admitting: Radiology

## 2023-08-17 ENCOUNTER — Ambulatory Visit (INDEPENDENT_AMBULATORY_CARE_PROVIDER_SITE_OTHER): Admitting: Radiology

## 2023-08-17 VITALS — BP 132/86 | HR 83 | Ht 67.5 in | Wt 188.0 lb

## 2023-08-17 DIAGNOSIS — R9389 Abnormal findings on diagnostic imaging of other specified body structures: Secondary | ICD-10-CM | POA: Diagnosis not present

## 2023-08-17 DIAGNOSIS — N921 Excessive and frequent menstruation with irregular cycle: Secondary | ICD-10-CM | POA: Diagnosis not present

## 2023-08-17 NOTE — Progress Notes (Signed)
      ENDOMETRIAL BIOPSY       Teresa Chang 53 y.o. presents as a new patient for irregular bleeding x 5 months, Having 2 periods a month. PCP started her on Seasonsale. Her u/s showed a thickened endometrium and fibroids.   Narrative & Impression  CLINICAL DATA:  Menorrhagia.  Perimenopausal.   EXAM: TRANSABDOMINAL ULTRASOUND OF PELVIS   TECHNIQUE: Transabdominal ultrasound examination of the pelvis was performed including evaluation of the uterus, ovaries, adnexal regions, and pelvic cul-de-sac.   COMPARISON:  None Available.   FINDINGS: Uterus   Measurements: 11.9 x 6.1 x 6.5 cm = volume: 248 mL. Uterine fibroids are identified, larger measures 5.8 x 5.2 x 5.1 cm in the posterior left myometrium.   Endometrium   Thickness: 9 mm.  No focal abnormality visualized.   Right ovary   Measurements: 2.2 x 1.5 x 1.8 cm = volume: 3.1 mL. Normal appearance/no adnexal mass.   Left ovary   Measurements: 2 x 1.3 x 1.9 cm = volume: 2.7 mL. Normal appearance/no adnexal mass.   Other findings:  No abnormal free fluid.   IMPRESSION: 1. No acute abnormality identified. 2. Uterine fibroids. 3. Endometrium measures 9 mm. If bleeding remains unresponsive to hormonal or medical therapy, sonohysterogram should be considered for focal lesion work-up. (Ref: Radiological Reasoning: Algorithmic Workup of Abnormal Vaginal Bleeding with Endovaginal Sonography and Sonohysterography. AJR 2008; 454:U98-11)     Electronically Signed   By: Anna Barnes M.D.   On: 08/08/2023 14:20   Patient would like to proceed with EMB today. The indications for endometrial biopsy were reviewed.    Risks of the biopsy including cramping, bleeding, infection, uterine perforation, inadequate specimen and need for additional procedures  were discussed.  The patient states she understands and agrees to undergo procedure today. Consent obtained.  Time out was performed.   Procedure Long Graves  speculum inserted into the vagina, cervix visualized and was prepped with Betadine. A single-toothed tenaculum was placed on the anterior lip of the cervix to stabilize it.  The 3 mm pipelle was introduced into the endometrial cavity without difficulty to a depth of 8cm, suction initiated and a moderate amount of tissue was obtained and sent to pathology.  The instruments were removed from the patient's vagina.  Minimal bleeding from the cervix was noted.  The patient tolerated the procedure well.   Ellis Guys, CMA present for exam  Assessment/Plan: 1. Thickened endometrium (Primary) Stop Seasonale - Surgical pathology( [place]/ [facility])  2. Menometrorrhagia - Surgical pathology( / [facility])    Routine post-procedure instructions were given to the patient.   Will contact with results of biopsy. Patient states it is ok to leave a message if she does not get to the phone.    Jasminemarie Sherrard, [facility] 08/17/23 1154

## 2023-08-19 LAB — SURGICAL PATHOLOGY

## 2023-08-22 ENCOUNTER — Telehealth: Payer: Self-pay | Admitting: Family Medicine

## 2023-08-22 NOTE — Telephone Encounter (Signed)
 Copied from CRM [id number]. Topic: Clinical - Lab/Test Results >> Aug 22, 2023  1:40 PM Shereese L wrote: Reason for CRM:  patient would like for Dr. Sheria Dills only to give her a call in reference to her results for her biopsy

## 2023-08-30 ENCOUNTER — Telehealth: Payer: Self-pay | Admitting: *Deleted

## 2023-08-30 NOTE — Telephone Encounter (Signed)
 Patient left voicemail on Triage line asking for return call. Returned call to patient. Patient states that someone from the office called stating she needed to schedule consult to discuss surgery. Patient states she "knew nothing about this." RN advised per Jami's notes from pathology, patient to schedule surgical consult with MD if bleeding persisted. Patient states she is still having bleeding every 2 weeks. Can vary from heavy to light, but still having to wear a pad because she never knows what the flow will be like. Patient states was put on Seasonale by PCP, but when she saw Jami, Jami told her that she should not be taking that, so she stopped. Declines scheduling consult with MD since bleeding still happening. Patient states she just wants to see if Hildy Lowers will allow her to go back on the Seasonale or what other options she has to stop the bleeding because "I don't want to keep going through this." RN advised Hildy Lowers is currently out of the office, but would review with covering provider and return call with recommendations. Patient agreeable and states it is okay to leave a detailed message on her voicemail.   Routing to covering provider to review and advise.

## 2023-08-30 NOTE — Telephone Encounter (Signed)
 This is Dr. Colvin Dec covering for [facility] who is currently out of the office.   I recommend she does not take the Seasonale birth control pills.  I see the patient has a history of hypertension and diastolic heart failure.   Her bleeding may be due to a polyp, her fibroids, or even a condition called endometriosis.  I recommend an office visit with a physician provider to review her care.   She may benefit from a sonohysterogram (saline ultrasound) to evaluate for an endometrial cause of the bleeding.

## 2023-08-31 NOTE — Telephone Encounter (Signed)
 LDVM on machine per DPR and pt's request and advised her to cb with response or any additional questions/concerns.

## 2023-09-01 NOTE — Telephone Encounter (Signed)
 Pt LVM in triage line stating that she would like this information that was recommended by Dr. Colvin Dec to be sent to her PCP on file Osborn Blaze, MD).   Ok to send?

## 2023-09-12 ENCOUNTER — Telehealth: Payer: Self-pay | Admitting: *Deleted

## 2023-09-12 ENCOUNTER — Encounter: Payer: Self-pay | Admitting: *Deleted

## 2023-09-12 NOTE — Telephone Encounter (Signed)
-----   Message from Aida House sent at 09/09/2023  2:48 PM EDT ----- Regarding: The recommendations from the GYN Did the patient receive this message from Dr. Colvin Dec? It is recommending she stop the Seasonale birth control.  It recommends this and also a follow up appointment with the gynecologist for further evaluation. Could you forward this information to the patient? Thanks! Dr. Bambi Lever ----- Message ----- From: Lindajo Resides, CMA Sent: 09/01/2023   4:34 PM EDT To: Aida House, MD  Good afternoon!  Our mutual patient wanted us  to share with you some recommendations by Dr. Colvin Dec in regards to the patient's menometrorrhagia so that she can also address this with you.   "This is Dr. Colvin Dec covering for [facility] who is currently out of the office.    I recommend she does not take the Seasonale birth control pills.  I see the patient has a history of hypertension and diastolic heart failure.    Her bleeding may be due to a polyp, her fibroids, or even a condition called endometriosis.   I recommend an office visit with a physician provider to review her care.    She may benefit from a sonohysterogram (saline ultrasound) to evaluate for an endometrial cause of the bleeding."

## 2023-09-12 NOTE — Telephone Encounter (Signed)
 Mychart message sent to the patient as below.

## 2023-09-13 NOTE — Telephone Encounter (Signed)
 She doesn't need to schedule with me, just the GYN who saw her previously.

## 2023-10-25 ENCOUNTER — Other Ambulatory Visit (HOSPITAL_COMMUNITY): Payer: Self-pay

## 2023-11-17 ENCOUNTER — Other Ambulatory Visit: Payer: Self-pay

## 2023-11-17 ENCOUNTER — Telehealth: Payer: Self-pay

## 2023-11-17 ENCOUNTER — Telehealth: Payer: Self-pay | Admitting: *Deleted

## 2023-11-17 DIAGNOSIS — E119 Type 2 diabetes mellitus without complications: Secondary | ICD-10-CM

## 2023-11-17 NOTE — Telephone Encounter (Signed)
 Left a detailed message with the information below at the patient's cell number per PCP.  Initial message was left stating the lab appt for 8/8 will be cancelled as PCP verbally told me the patient needs a visit first.  Returned a call back leaving a message stating PCP approved the lab appt on 8/8 with the pharmacist and to schedule a visit with PCP.

## 2023-11-17 NOTE — Telephone Encounter (Signed)
 Copied from CRM [id number]. Topic: Clinical - Medical Advice >> Nov 16, 2023  1:19 PM Shereese L wrote: Reason for CRM: Wanting to check A1C, also wanting to go over medication she's taking, concerned about her Confusion, taking time to process things Patient is requesting a call

## 2023-11-17 NOTE — Telephone Encounter (Signed)
 Ok she can schedule an appointment in person

## 2023-11-17 NOTE — Telephone Encounter (Signed)
Please see prior phone note.

## 2023-11-17 NOTE — Telephone Encounter (Signed)
 Patient informed of the message below.  Offered an appt with PCP and she stated she does not want to wait until 8/12.  Asked if we had any dieticians here and was informed we do not have any dieticians at [facility].  Offered an appt with another provider if she had an urgent need and the patient stated she will call another provider by the name of Dr Kovo and call back for an appt here if needed.

## 2023-11-17 NOTE — Telephone Encounter (Signed)
 I understand, however she will need a visit in order to be evaluated. If she would like me to call her we can set up a video visit -- this sets aside time for us  to discuss her symptoms.

## 2023-11-17 NOTE — Telephone Encounter (Signed)
 Copied from CRM [id number]. Topic: General - Other >> Nov 17, 2023  9:03 AM Teresa Chang wrote: Reason for CRM: Patient called in stating that she needs the DOCTOR to call her she needs to speak with Dr. Heron Sharper. She does not need an appointment, and she does not want to speak with anyone else. Patient is request [phone]. Patient is requesting call back today asap possible. She would not disclose the nature of the call.

## 2023-11-17 NOTE — Progress Notes (Signed)
   11/17/2023  Patient ID: Teresa Chang, female   DOB: [birth year], 53 y.o.   MRN: [id number]  Patient requested to speak with pharmacist. Contacted patient and setup appt for med review via phone tmrw.  Patient also requests updated A1C as she has not been monitoring her sugars and feels they may be running high. Is not able to check sugar at time of call as she is driving. Only symptom reported at this time is forgetfulness and finding her words. In past, she reports this has been an indicator that her BG is elevated.  Scheduled lab visit for A1C check with PCP approval. Instructed patient to check her BG as soon as she is able to do so. Counseled on ED precautions.   Jon VEAR Lindau, PharmD Clinical Pharmacist [phone]

## 2023-11-18 ENCOUNTER — Other Ambulatory Visit

## 2023-11-18 ENCOUNTER — Other Ambulatory Visit: Payer: Self-pay

## 2023-11-18 NOTE — Addendum Note (Signed)
 Addended by: LIONELL JON DEL on: 11/18/2023 02:22 PM   Modules accepted: Orders

## 2023-11-18 NOTE — Progress Notes (Unsigned)
 11/18/2023 Name: Teresa Chang MRN: [id number] DOB: Oct 31, 1970  No chief complaint on file.   Teresa Chang is a 53 y.o. year old female who presented for a telephone visit.   They were referred to the pharmacist by previous clinical pharmacist for assistance in managing complex medication management.    Subjective:  Care Team: Primary Care Provider: Ozell Heron HERO, MD ; Next Scheduled Visit: ***  Medication Access/Adherence  Current Pharmacy:  [facility] [address] [place] [address] Phone: [phone] Fax: [phone]   Patient reports affordability concerns with their medications: {YES/NO:21197} Patient reports access/transportation concerns to their pharmacy: {YES/NO:21197} Patient reports adherence concerns with their medications:  {YES/NO:21197} ***   Diabetes:  Current medications: Ozempic  0.5mg  once weekly,  Medications tried in the past: Trulicity , Jardiance , Novolog , Metformin   Current glucose readings: *** Using *** meter; testing *** times daily   Observed patterns:  Patient {Actions; denies-reports:120008} hypoglycemic s/sx including ***dizziness, shakiness, sweating. Patient {Actions; denies-reports:120008} hyperglycemic symptoms including ***polyuria, polydipsia, polyphagia, nocturia, neuropathy, blurred vision.  Current meal patterns:  - Breakfast: *** - Lunch *** - Supper *** - Snacks *** - Drinks ***  Current physical activity: ***  Current medication access support: ***  Hyperlipidemia/ASCVD Risk Reduction  Current lipid lowering medications: Rosuvastatin  20mg  Medications tried in the past: None   Heart Failure (EF 60-65%):  Current medications:  ACEi/ARB/ARNI: None SGLT2i: None Beta blocker: None Mineralocorticoid Receptor Antagonist: None Diuretic regimen: None  Hx of lisinopril /hydrochlorothiazide , olmesartan , lasix , jardiance   Current home blood pressure  readings: *** Current home weights: ***  Patient {Actions; denies-reports:120008} volume overload signs or symptoms including ***shortness of breath, lower extremity edema, increased use of pillows at night   Current physical activity: ***  Current medication access support: ***   Objective:  Lab Results  Component Value Date   HGBA1C 6.8 (A) 07/18/2023    Lab Results  Component Value Date   CREATININE 1.16 (H) 10/05/2022   BUN 17 10/05/2022   NA 136 10/05/2022   K 4.1 10/05/2022   CL 100 10/05/2022   CO2 21 10/05/2022    Lab Results  Component Value Date   CHOL 115 08/18/2021   HDL 41 08/18/2021   LDLCALC 54 08/18/2021   TRIG 106 08/18/2021   CHOLHDL 2.8 08/18/2021    Medications Reviewed Today   Medications were not reviewed in this encounter       Assessment/Plan:   Diabetes: - Currently {CHL Controlled/Uncontrolled:[phone]} - Reviewed long term cardiovascular and renal outcomes of uncontrolled blood sugar - Reviewed goal A1c, goal fasting, and goal 2 hour post prandial glucose - Reviewed dietary modifications including *** - Reviewed lifestyle modifications including:  - Recommend to ***  - Patient denies personal or family history of multiple endocrine neoplasia type 2, medullary thyroid  cancer; personal history of pancreatitis or gallbladder disease. - Recommend to check glucose *** - Meets financial criteria for *** patient assistance program through ***. Will collaborate with provider, CPhT, and patient to pursue assistance.      Hyperlipidemia/ASCVD Risk Reduction: - Currently {CHL Controlled/Uncontrolled:[phone]}.  - Reviewed long term complications of uncontrolled cholesterol - Reviewed dietary recommendations including *** - Reviewed lifestyle recommendations including *** - Recommend to ***  - Meets financial criteria for *** patient assistance program through ***. Will collaborate with provider, CPhT, and patient to pursue  assistance.     Heart Failure: - Currently {managed:26422} - Reviewed appropriate blood pressure monitoring technique and reviewed goal  blood pressure - Reviewed to weigh daily and when to contact cardiology with weight gain - Reviewed dietary modifications including *** - Recommend to ***  - Meets financial criteria for *** patient assistance program through ***. Will collaborate with provider, CPhT, and patient to pursue assistance.    Follow Up Plan: ***  Jon VEAR Lindau, PharmD Clinical Pharmacist [phone]

## 2023-11-29 ENCOUNTER — Other Ambulatory Visit (HOSPITAL_COMMUNITY): Payer: Self-pay

## 2023-12-05 ENCOUNTER — Telehealth: Payer: Self-pay

## 2023-12-05 NOTE — Progress Notes (Signed)
   12/05/2023  Patient ID: Teresa Chang, female   DOB: 08-11-1970, 53 y.o.   MRN: [id number]  Attempted to contact patient to reschedule the previously cancelled appt for medication management. Left HIPAA compliant message for patient to return my call at their convenience.   Jon VEAR Lindau, PharmD Clinical Pharmacist [phone]

## 2023-12-14 ENCOUNTER — Other Ambulatory Visit (HOSPITAL_COMMUNITY): Payer: Self-pay

## 2024-01-02 ENCOUNTER — Telehealth: Payer: Self-pay | Admitting: *Deleted

## 2024-01-02 NOTE — Telephone Encounter (Unsigned)
 Copied from CRM #[id number]. Topic: Clinical - Medical Advice >> Jan 02, 2024  2:50 PM Rea ORN wrote: Reason for CRM: Pt stated she is going through pre-menopause and her sx are getting too intense. She is having hot flashes and mood changes that have increased since last month. She is wondering if there are other birth controls that she can take.   Please call back and if pt doesn't answer, please leave voicemail.

## 2024-01-04 NOTE — Telephone Encounter (Signed)
She will need to schedule an appt to discuss.

## 2024-01-04 NOTE — Telephone Encounter (Signed)
 Patient informed of the message below and an appt was scheduled on 10/6.

## 2024-01-16 ENCOUNTER — Ambulatory Visit: Admitting: Family Medicine

## 2024-02-02 ENCOUNTER — Other Ambulatory Visit (HOSPITAL_COMMUNITY): Payer: Self-pay

## 2024-02-02 ENCOUNTER — Other Ambulatory Visit: Payer: Self-pay

## 2024-02-02 DIAGNOSIS — H401134 Primary open-angle glaucoma, bilateral, indeterminate stage: Secondary | ICD-10-CM | POA: Diagnosis not present

## 2024-02-02 DIAGNOSIS — H25812 Combined forms of age-related cataract, left eye: Secondary | ICD-10-CM | POA: Diagnosis not present

## 2024-02-02 DIAGNOSIS — E119 Type 2 diabetes mellitus without complications: Secondary | ICD-10-CM | POA: Diagnosis not present

## 2024-02-02 DIAGNOSIS — Z961 Presence of intraocular lens: Secondary | ICD-10-CM | POA: Diagnosis not present

## 2024-02-02 MED ORDER — VYZULTA 0.024 % OP SOLN
1.0000 [drp] | Freq: Every evening | OPHTHALMIC | 0 refills | Status: DC
  Filled 2024-02-02: qty 2.5, 25d supply, fill #0

## 2024-02-02 MED ORDER — LATANOPROST 0.005 % OP SOLN
1.0000 [drp] | Freq: Every day | OPHTHALMIC | 0 refills | Status: DC
  Filled 2024-02-02: qty 2.5, 25d supply, fill #0

## 2024-02-02 MED ORDER — DORZOLAMIDE HCL-TIMOLOL MAL 2-0.5 % OP SOLN
1.0000 [drp] | Freq: Two times a day (BID) | OPHTHALMIC | 0 refills | Status: DC
  Filled 2024-02-02 (×2): qty 10, 50d supply, fill #0

## 2024-02-03 ENCOUNTER — Other Ambulatory Visit (HOSPITAL_COMMUNITY): Payer: Self-pay

## 2024-02-07 ENCOUNTER — Other Ambulatory Visit (HOSPITAL_BASED_OUTPATIENT_CLINIC_OR_DEPARTMENT_OTHER): Payer: Self-pay | Admitting: Cardiology

## 2024-02-07 ENCOUNTER — Other Ambulatory Visit: Payer: Self-pay

## 2024-02-07 DIAGNOSIS — E119 Type 2 diabetes mellitus without complications: Secondary | ICD-10-CM

## 2024-02-07 DIAGNOSIS — E782 Mixed hyperlipidemia: Secondary | ICD-10-CM

## 2024-02-08 ENCOUNTER — Other Ambulatory Visit (HOSPITAL_COMMUNITY): Payer: Self-pay

## 2024-02-08 MED ORDER — ROSUVASTATIN CALCIUM 20 MG PO TABS
20.0000 mg | ORAL_TABLET | Freq: Every day | ORAL | 0 refills | Status: AC
  Filled 2024-02-08: qty 30, 30d supply, fill #0

## 2024-02-15 ENCOUNTER — Other Ambulatory Visit (HOSPITAL_COMMUNITY): Payer: Self-pay

## 2024-02-15 DIAGNOSIS — H401121 Primary open-angle glaucoma, left eye, mild stage: Secondary | ICD-10-CM | POA: Diagnosis not present

## 2024-02-15 DIAGNOSIS — H401113 Primary open-angle glaucoma, right eye, severe stage: Secondary | ICD-10-CM | POA: Diagnosis not present

## 2024-02-15 MED ORDER — TRAVOPROST (BAK FREE) 0.004 % OP SOLN
1.0000 [drp] | Freq: Every evening | OPHTHALMIC | 1 refills | Status: DC
  Filled 2024-02-15: qty 2.5, 25d supply, fill #0
  Filled 2024-03-08 – 2024-04-10 (×3): qty 2.5, 25d supply, fill #1

## 2024-02-15 MED ORDER — DORZOLAMIDE HCL-TIMOLOL MAL 2-0.5 % OP SOLN
1.0000 [drp] | Freq: Two times a day (BID) | OPHTHALMIC | 6 refills | Status: AC
  Filled 2024-02-15 – 2024-04-10 (×5): qty 10, 50d supply, fill #0
  Filled 2024-05-10: qty 10, 34d supply, fill #1

## 2024-02-16 ENCOUNTER — Other Ambulatory Visit (HOSPITAL_COMMUNITY): Payer: Self-pay

## 2024-02-17 ENCOUNTER — Other Ambulatory Visit (HOSPITAL_COMMUNITY): Payer: Self-pay

## 2024-02-22 ENCOUNTER — Other Ambulatory Visit (HOSPITAL_COMMUNITY): Payer: Self-pay

## 2024-02-24 ENCOUNTER — Other Ambulatory Visit (HOSPITAL_COMMUNITY): Payer: Self-pay

## 2024-03-01 ENCOUNTER — Other Ambulatory Visit (HOSPITAL_COMMUNITY): Payer: Self-pay

## 2024-03-01 ENCOUNTER — Telehealth: Payer: Self-pay | Admitting: Family Medicine

## 2024-03-01 NOTE — Telephone Encounter (Signed)
 Unable to pend    Copied from CRM #[id number]. Topic: Clinical - Prescription Issue >> Mar 01, 2024 10:09 AM Alfonso ORN wrote: Reason for CRM: Vitamin D3 needs to be transferred to preferred pharmacy [facility] . Please update rx

## 2024-03-02 ENCOUNTER — Other Ambulatory Visit (HOSPITAL_COMMUNITY): Payer: Self-pay

## 2024-03-02 MED ORDER — VITAMIN D (ERGOCALCIFEROL) 1.25 MG (50000 UNIT) PO CAPS
50000.0000 [IU] | ORAL_CAPSULE | ORAL | 0 refills | Status: DC
  Filled 2024-03-02: qty 12, 84d supply, fill #0

## 2024-03-02 NOTE — Telephone Encounter (Signed)
Ok to send new rx

## 2024-03-02 NOTE — Telephone Encounter (Signed)
 Rx done.

## 2024-03-02 NOTE — Addendum Note (Signed)
 Addended by: CHRISTYNE IDELL LABOR on: 03/02/2024 10:33 AM   Modules accepted: Orders

## 2024-03-09 ENCOUNTER — Other Ambulatory Visit: Payer: Self-pay

## 2024-03-09 ENCOUNTER — Other Ambulatory Visit (HOSPITAL_COMMUNITY): Payer: Self-pay

## 2024-03-15 ENCOUNTER — Other Ambulatory Visit (HOSPITAL_COMMUNITY): Payer: Self-pay

## 2024-03-22 ENCOUNTER — Other Ambulatory Visit (HOSPITAL_COMMUNITY): Payer: Self-pay

## 2024-03-23 ENCOUNTER — Other Ambulatory Visit (HOSPITAL_COMMUNITY): Payer: Self-pay

## 2024-03-23 ENCOUNTER — Other Ambulatory Visit: Payer: Self-pay

## 2024-03-26 ENCOUNTER — Other Ambulatory Visit (HOSPITAL_COMMUNITY): Payer: Self-pay

## 2024-04-02 ENCOUNTER — Other Ambulatory Visit: Payer: Self-pay | Admitting: Family Medicine

## 2024-04-02 DIAGNOSIS — E119 Type 2 diabetes mellitus without complications: Secondary | ICD-10-CM

## 2024-04-03 ENCOUNTER — Telehealth (HOSPITAL_COMMUNITY): Payer: Self-pay

## 2024-04-03 ENCOUNTER — Encounter (HOSPITAL_COMMUNITY): Payer: Self-pay

## 2024-04-03 ENCOUNTER — Other Ambulatory Visit (HOSPITAL_COMMUNITY): Payer: Self-pay

## 2024-04-03 MED ORDER — OZEMPIC (0.25 OR 0.5 MG/DOSE) 2 MG/3ML ~~LOC~~ SOPN
0.5000 mg | PEN_INJECTOR | SUBCUTANEOUS | 5 refills | Status: DC
  Filled 2024-04-03 (×2): qty 3, 28d supply, fill #0

## 2024-04-03 MED ORDER — VITAMIN D (ERGOCALCIFEROL) 1.25 MG (50000 UNIT) PO CAPS
50000.0000 [IU] | ORAL_CAPSULE | ORAL | 0 refills | Status: AC
  Filled 2024-04-03: qty 12, 84d supply, fill #0

## 2024-04-03 NOTE — Telephone Encounter (Signed)
 Pharmacy Patient Advocate Encounter   Received notification from Pt Calls Messages that prior authorization for Ozempic  (0.25 or 0.5 MG/DOSE) 2MG /3ML pen-injectors  is required/requested.   Insurance verification completed.   The patient is insured through [facility] MEDICAID.   Per test claim: PA required; PA submitted to above mentioned insurance via Latent Key/confirmation #/EOC [id number] Status is pending

## 2024-04-03 NOTE — Telephone Encounter (Signed)
 PA request has been Received. New Encounter has been or will be created for follow up. For additional info see Pharmacy Prior Auth telephone encounter from 04/03/24.

## 2024-04-03 NOTE — Telephone Encounter (Signed)
 Pharmacy Patient Advocate Encounter  Received notification from [facility] MEDICAID that Prior Authorization for Ozempic  (0.25 or 0.5 MG/DOSE) 2MG /3ML pen-injectors  has been APPROVED from 04/03/24 to 04/03/25. Ran test claim, Copay is $4. This test claim was processed through [facility]- copay amounts may vary at other pharmacies due to pharmacy/plan contracts, or as the patient moves through the different stages of their insurance plan.   PA #/Case ID/Reference #: [id number]

## 2024-04-04 ENCOUNTER — Other Ambulatory Visit (HOSPITAL_COMMUNITY): Payer: Self-pay

## 2024-04-10 ENCOUNTER — Other Ambulatory Visit (HOSPITAL_COMMUNITY): Payer: Self-pay

## 2024-04-19 ENCOUNTER — Observation Stay (HOSPITAL_COMMUNITY)
Admission: EM | Admit: 2024-04-19 | Discharge: 2024-04-22 | Disposition: A | Attending: Internal Medicine | Admitting: Internal Medicine

## 2024-04-19 ENCOUNTER — Encounter (HOSPITAL_COMMUNITY): Payer: Self-pay

## 2024-04-19 ENCOUNTER — Other Ambulatory Visit: Payer: Self-pay

## 2024-04-19 DIAGNOSIS — I10 Essential (primary) hypertension: Secondary | ICD-10-CM | POA: Diagnosis present

## 2024-04-19 DIAGNOSIS — E663 Overweight: Secondary | ICD-10-CM | POA: Diagnosis not present

## 2024-04-19 DIAGNOSIS — E782 Mixed hyperlipidemia: Secondary | ICD-10-CM | POA: Diagnosis not present

## 2024-04-19 DIAGNOSIS — Z6827 Body mass index (BMI) 27.0-27.9, adult: Secondary | ICD-10-CM | POA: Diagnosis not present

## 2024-04-19 DIAGNOSIS — H409 Unspecified glaucoma: Secondary | ICD-10-CM | POA: Diagnosis present

## 2024-04-19 DIAGNOSIS — I110 Hypertensive heart disease with heart failure: Secondary | ICD-10-CM | POA: Insufficient documentation

## 2024-04-19 DIAGNOSIS — E119 Type 2 diabetes mellitus without complications: Secondary | ICD-10-CM

## 2024-04-19 DIAGNOSIS — M48061 Spinal stenosis, lumbar region without neurogenic claudication: Secondary | ICD-10-CM

## 2024-04-19 DIAGNOSIS — E559 Vitamin D deficiency, unspecified: Secondary | ICD-10-CM | POA: Diagnosis present

## 2024-04-19 DIAGNOSIS — I5032 Chronic diastolic (congestive) heart failure: Secondary | ICD-10-CM | POA: Diagnosis not present

## 2024-04-19 DIAGNOSIS — R5383 Other fatigue: Secondary | ICD-10-CM

## 2024-04-19 DIAGNOSIS — M545 Low back pain, unspecified: Principal | ICD-10-CM | POA: Insufficient documentation

## 2024-04-19 DIAGNOSIS — Z87891 Personal history of nicotine dependence: Secondary | ICD-10-CM | POA: Insufficient documentation

## 2024-04-19 DIAGNOSIS — R0609 Other forms of dyspnea: Secondary | ICD-10-CM

## 2024-04-19 DIAGNOSIS — E1139 Type 2 diabetes mellitus with other diabetic ophthalmic complication: Secondary | ICD-10-CM | POA: Insufficient documentation

## 2024-04-19 DIAGNOSIS — M5459 Other low back pain: Secondary | ICD-10-CM | POA: Diagnosis present

## 2024-04-19 DIAGNOSIS — M549 Dorsalgia, unspecified: Principal | ICD-10-CM

## 2024-04-19 NOTE — ED Triage Notes (Signed)
 Arrives [facility] after waking up with severe midline lower back pain. Pain aggravated with movement and lifting legs, putting on shoes, etc.   Says she had pain like this once 20 years ago but resolved fairly quickly.

## 2024-04-19 NOTE — ED Provider Notes (Signed)
 " [place] EMERGENCY DEPARTMENT AT [facility] Provider Note   CSN: [id number] Arrival date & time: 04/19/24  2245     Patient presents with: Back Pain   Teresa Chang is a 54 y.o. female.   The history is provided by the patient.  Back Pain  She has a history of hypertension, diabetes, glaucoma and comes in complaining of back pain for the last 2 days which has been getting worse.  She thought that it was because she was drinking too much soda so she tried drinking more water but had no relief.  She did take acetaminophen  which let her sleep, but did not actually take the pain away.  She denies any radiation of pain and denies any weakness or numbness or tingling.  She denies any bowel or bladder dysfunction.  She has had back problems previously, but they have usually resolved quickly.  She denies any recent trauma or unusual activity.    Prior to Admission medications  Medication Sig Start Date End Date Taking? Authorizing Provider  Accu-Chek Softclix Lancets lancets use as directed 02/03/23   McDiarmid, Krystal BIRCH, MD  Blood Glucose Monitoring Suppl (ACCU-CHEK GUIDE) w/Device KIT USE AS DIRECTED 02/03/23   McDiarmid, Krystal BIRCH, MD  Cholecalciferol (VITAMIN D3) 1.25 MG (50000 UT) CAPS Take 1 capsule (1.25 mg total) by mouth once a week. Patient not taking: Reported on 08/17/2023 01/15/23   Dameron, Marisa, DO  diclofenac  Sodium (VOLTAREN ) 1 % GEL Apply topically. Patient not taking: Reported on 08/17/2023 02/18/22   [provider]  dorzolamide -timolol  (COSOPT ) 2-0.5 % ophthalmic solution Place 1 drop into both eyes 2 (two) times daily. 02/15/24     fluticasone  (FLONASE ) 50 MCG/ACT nasal spray Place 2 sprays into both nostrils daily. 02/03/23   Dameron, Marisa, DO  glucose blood (ACCU-CHEK SMARTVIEW) test strip Use as instructed - test three times daily 03/03/23   McDiarmid, Krystal BIRCH, MD  latanoprost  (XALATAN ) 0.005 % ophthalmic solution Place 1 drop into both eyes at bedtime.  02/02/24     Latanoprostene Bunod  (VYZULTA ) 0.024 % SOLN Place 1 drop into both eyes at bedtime. 02/02/24     levonorgestrel -ethinyl estradiol  (SEASONALE) 0.15-0.03 MG tablet Take 1 tablet by mouth daily. 07/21/23   Ozell Heron HERO, MD  loratadine  (CLARITIN ) 10 MG tablet Take 1 tablet (10 mg total) by mouth daily. 01/15/23   Dameron, Marisa, DO  rosuvastatin  (CRESTOR ) 20 MG tablet Take 1 tablet (20 mg total) by mouth daily for cholesterol 02/08/24   Lonni Slain, MD  Semaglutide ,0.25 or 0.5MG /DOS, (OZEMPIC , 0.25 OR 0.5 MG/DOSE,) 2 MG/3ML SOPN Inject 0.5 mg into the skin once a week. 04/03/24   Ozell Heron HERO, MD  Travoprost , BAK Free, (TRAVATAN  Z) 0.004 % SOLN ophthalmic solution Place 1 drop into both eyes at bedtime. 02/15/24     Vitamin D , Ergocalciferol , (DRISDOL ) 1.25 MG (50000 UNIT) CAPS capsule Take 1 capsule (50,000 Units total) by mouth every 7 (seven) days. 04/03/24   Ozell Heron HERO, MD    Allergies: Patient has no known allergies.    Review of Systems  Musculoskeletal:  Positive for back pain.  All other systems reviewed and are negative.   Updated Vital Signs BP (!) 186/103   Pulse 78   Temp 97.8 F (36.6 C)   Resp (!) 25   Ht 5' 7 (1.702 m)   Wt 81.6 kg   SpO2 100%   BMI 28.19 kg/m   Physical Exam Vitals and nursing note reviewed.  54 year old female, appears to be in pain, but is in no acute distress. Vital signs are significant for elevated blood pressure and respiratory rate. Oxygen saturation is 100%, which is normal. Head is normocephalic and atraumatic. PERRLA, EOMI. Back is nontender in the midline.  There is moderate left paralumbar spasm and tenderness.  Straight leg raise is positive bilaterally at 10 degrees. Lungs are clear without rales, wheezes, or rhonchi. Heart has regular rate and rhythm without murmur. Abdomen is soft, flat, nontender. Neurologic: Awake, alert, oriented.  Strength is 5/5 in all 4 extremities.  Sensation is  intact throughout.  (all labs ordered are listed, but only abnormal results are displayed) Labs Reviewed - No data to display  EKG: None  Radiology: No results found.   Procedures   Medications Ordered in the ED - No data to display                                  Medical Decision Making Amount and/or Complexity of Data Reviewed Labs: ordered. Radiology: ordered.  Risk OTC drugs. Prescription drug management.   Acute low back pain which appears to be musculoskeletal.  No red flags to suggest more serious pathology such as pyelonephritis, urolithiasis, abdominal aneurysm.  At this point, no indication for imaging.  I have ordered intravenous ketorolac  and methocarbamol , oral acetaminophen .  She had no relief with above-noted treatment.  I have given her intravenous morphine , still with no relief.  I have ordered additional morphine .  Because of inability to control pain, I have ordered MRI of the lumbar spine.  Case is signed out to Dr. Ellouise.     Final diagnoses:  Intractable back pain    ED Discharge Orders     None          Raford Lenis, MD 04/20/24 [phone]  "

## 2024-04-20 ENCOUNTER — Emergency Department (HOSPITAL_COMMUNITY)

## 2024-04-20 DIAGNOSIS — H409 Unspecified glaucoma: Secondary | ICD-10-CM | POA: Diagnosis present

## 2024-04-20 DIAGNOSIS — M5459 Other low back pain: Secondary | ICD-10-CM | POA: Diagnosis present

## 2024-04-20 DIAGNOSIS — E663 Overweight: Secondary | ICD-10-CM | POA: Diagnosis present

## 2024-04-20 LAB — BASIC METABOLIC PANEL WITH GFR
Anion gap: 10 (ref 5–15)
BUN: 10 mg/dL (ref 6–20)
CO2: 18 mmol/L — ABNORMAL LOW (ref 22–32)
Calcium: 8.9 mg/dL (ref 8.9–10.3)
Chloride: 109 mmol/L (ref 98–111)
Creatinine, Ser: 0.94 mg/dL (ref 0.44–1.00)
GFR, Estimated: >60 mL/min
Glucose, Bld: 138 mg/dL — ABNORMAL HIGH (ref 70–99)
Potassium: 4.1 mmol/L (ref 3.5–5.1)
Sodium: 137 mmol/L (ref 135–145)

## 2024-04-20 LAB — CBC WITH DIFFERENTIAL/PLATELET
Abs Immature Granulocytes: 0.01 K/uL (ref 0.00–0.07)
Basophils Absolute: 0.1 K/uL (ref 0.0–0.1)
Basophils Relative: 1 %
Eosinophils Absolute: 0.1 K/uL (ref 0.0–0.5)
Eosinophils Relative: 1 %
HCT: 37.8 % (ref 36.0–46.0)
Hemoglobin: 11.8 g/dL — ABNORMAL LOW (ref 12.0–15.0)
Immature Granulocytes: 0 %
Lymphocytes Relative: 30 %
Lymphs Abs: 2.1 K/uL (ref 0.7–4.0)
MCH: 25.2 pg — ABNORMAL LOW (ref 26.0–34.0)
MCHC: 31.2 g/dL (ref 30.0–36.0)
MCV: 80.8 fL (ref 80.0–100.0)
Monocytes Absolute: 0.6 K/uL (ref 0.1–1.0)
Monocytes Relative: 8 %
Neutro Abs: 4.3 K/uL (ref 1.7–7.7)
Neutrophils Relative %: 60 %
Platelets: 150 K/uL (ref 150–400)
RBC: 4.68 MIL/uL (ref 3.87–5.11)
RDW: 15.1 % (ref 11.5–15.5)
WBC: 7.1 K/uL (ref 4.0–10.5)
nRBC: 0 % (ref 0.0–0.2)

## 2024-04-20 LAB — URINALYSIS, ROUTINE W REFLEX MICROSCOPIC
Bilirubin Urine: NEGATIVE
Glucose, UA: NEGATIVE mg/dL
Hgb urine dipstick: NEGATIVE
Ketones, ur: NEGATIVE mg/dL
Leukocytes,Ua: NEGATIVE
Nitrite: NEGATIVE
Protein, ur: NEGATIVE mg/dL
Specific Gravity, Urine: 1.01 (ref 1.005–1.030)
pH: 6 (ref 5.0–8.0)

## 2024-04-20 LAB — CBG MONITORING, ED
Glucose-Capillary: 134 mg/dL — ABNORMAL HIGH (ref 70–99)
Glucose-Capillary: 170 mg/dL — ABNORMAL HIGH (ref 70–99)

## 2024-04-20 MED ORDER — ACETAMINOPHEN 500 MG PO TABS
1000.0000 mg | ORAL_TABLET | Freq: Once | ORAL | Status: AC
  Administered 2024-04-20: 1000 mg via ORAL
  Filled 2024-04-20: qty 2

## 2024-04-20 MED ORDER — ACETAMINOPHEN 325 MG PO TABS
650.0000 mg | ORAL_TABLET | Freq: Once | ORAL | Status: AC
  Administered 2024-04-20: 650 mg via ORAL
  Filled 2024-04-20: qty 2

## 2024-04-20 MED ORDER — LATANOPROSTENE BUNOD 0.024 % OP SOLN: 1.0000 [drp] | Freq: Every day | OPHTHALMIC | Status: DC

## 2024-04-20 MED ORDER — ACETAMINOPHEN 325 MG PO TABS: 650.0000 mg | ORAL_TABLET | Freq: Four times a day (QID) | ORAL | Status: DC | PRN

## 2024-04-20 MED ORDER — KETOROLAC TROMETHAMINE 30 MG/ML IJ SOLN
30.0000 mg | Freq: Once | INTRAMUSCULAR | Status: AC
  Administered 2024-04-20: 30 mg via INTRAVENOUS
  Filled 2024-04-20: qty 1

## 2024-04-20 MED ORDER — ROSUVASTATIN CALCIUM 10 MG PO TABS
20.0000 mg | ORAL_TABLET | Freq: Every day | ORAL | Status: DC
  Administered 2024-04-20 – 2024-04-22 (×3): 20 mg via ORAL
  Filled 2024-04-20 (×3): qty 2

## 2024-04-20 MED ORDER — KETOROLAC TROMETHAMINE 30 MG/ML IJ SOLN
30.0000 mg | Freq: Four times a day (QID) | INTRAMUSCULAR | Status: AC
  Administered 2024-04-20 – 2024-04-21 (×3): 30 mg via INTRAVENOUS
  Filled 2024-04-20 (×3): qty 1

## 2024-04-20 MED ORDER — METHOCARBAMOL 500 MG PO TABS
500.0000 mg | ORAL_TABLET | Freq: Four times a day (QID) | ORAL | Status: DC | PRN
  Administered 2024-04-20 – 2024-04-21 (×2): 500 mg via ORAL
  Filled 2024-04-20 (×2): qty 1

## 2024-04-20 MED ORDER — ONDANSETRON HCL 4 MG/2ML IJ SOLN: 4.0000 mg | Freq: Four times a day (QID) | INTRAMUSCULAR | Status: DC | PRN

## 2024-04-20 MED ORDER — MORPHINE SULFATE (PF) 4 MG/ML IV SOLN
4.0000 mg | Freq: Once | INTRAVENOUS | Status: AC
  Administered 2024-04-20: 4 mg via INTRAVENOUS
  Filled 2024-04-20: qty 1

## 2024-04-20 MED ORDER — LATANOPROST 0.005 % OP SOLN
1.0000 [drp] | Freq: Every day | OPHTHALMIC | Status: DC
  Administered 2024-04-20 – 2024-04-21 (×2): 1 [drp] via OPHTHALMIC
  Filled 2024-04-20: qty 2.5

## 2024-04-20 MED ORDER — METHOCARBAMOL 1000 MG/10ML IJ SOLN
1000.0000 mg | Freq: Once | INTRAMUSCULAR | Status: AC
  Administered 2024-04-20: 1000 mg via INTRAVENOUS
  Filled 2024-04-20: qty 10

## 2024-04-20 MED ORDER — ONDANSETRON HCL 4 MG PO TABS: 4.0000 mg | ORAL_TABLET | Freq: Four times a day (QID) | ORAL | Status: DC | PRN

## 2024-04-20 MED ORDER — OXYCODONE HCL 5 MG PO TABS
5.0000 mg | ORAL_TABLET | Freq: Four times a day (QID) | ORAL | Status: DC | PRN
  Administered 2024-04-20 – 2024-04-21 (×2): 5 mg via ORAL
  Filled 2024-04-20 (×2): qty 1

## 2024-04-20 MED ORDER — CYCLOBENZAPRINE HCL 10 MG PO TABS
10.0000 mg | ORAL_TABLET | Freq: Once | ORAL | Status: AC
  Administered 2024-04-20: 10 mg via ORAL
  Filled 2024-04-20: qty 1

## 2024-04-20 MED ORDER — ENOXAPARIN SODIUM 40 MG/0.4ML IJ SOSY
40.0000 mg | PREFILLED_SYRINGE | INTRAMUSCULAR | Status: DC
  Administered 2024-04-20 – 2024-04-21 (×2): 40 mg via SUBCUTANEOUS
  Filled 2024-04-20 (×2): qty 0.4

## 2024-04-20 MED ORDER — MORPHINE SULFATE (PF) 4 MG/ML IV SOLN: 4.0000 mg | Freq: Once | INTRAVENOUS | Status: DC

## 2024-04-20 MED ORDER — KETOROLAC TROMETHAMINE 15 MG/ML IJ SOLN
15.0000 mg | Freq: Once | INTRAMUSCULAR | Status: AC
  Administered 2024-04-20: 15 mg via INTRAVENOUS
  Filled 2024-04-20: qty 1

## 2024-04-20 MED ORDER — ACETAMINOPHEN 650 MG RE SUPP: 650.0000 mg | Freq: Four times a day (QID) | RECTAL | Status: DC | PRN

## 2024-04-20 MED ORDER — DORZOLAMIDE HCL-TIMOLOL MAL 2-0.5 % OP SOLN
1.0000 [drp] | Freq: Two times a day (BID) | OPHTHALMIC | Status: DC
  Administered 2024-04-21 – 2024-04-22 (×3): 1 [drp] via OPHTHALMIC
  Filled 2024-04-20: qty 10

## 2024-04-20 NOTE — ED Notes (Signed)
 Patient transported to MRI

## 2024-04-20 NOTE — H&P (Signed)
 " History and Physical    Patient: Teresa Chang FMW:[id number] DOB: Jan 28, 1971 DOA: 04/19/2024 DOS: the patient was seen and examined on 04/20/2024 PCP: Ozell Heron HERO, MD  Patient coming from: Home  Chief Complaint:  Chief Complaint  Patient presents with   Back Pain   HPI: Teresa Chang is a 54 y.o. female with medical history significant of anxiety, depression, bipolar disorder, glaucoma, hypertension, type 2 diabetes use, chronic diastolic heart failure, left carpal tunnel syndrome, microscopic hematuria, history of obesity now improved with a BMI of 28.19 kg/m, vitamin D  deficiency, history of lower back pain who presented to the emergency department with complaints of severe midline lower back pain that worsens with movement or lifting legs.  She had a similar episode about 20 years ago that resolved fairly quickly. He denied fever, chills, rhinorrhea, sore throat, wheezing or hemoptysis.  No chest pain, palpitations, diaphoresis, PND, orthopnea or pitting edema of the lower extremities.  No abdominal pain, nausea, emesis, diarrhea, constipation, melena or hematochezia.  No flank pain, dysuria, frequency or hematuria.  No polyuria, polydipsia, polyphagia or blurred vision.   Lab work: CBC showed a white count of 7.41, hemoglobin 11.8 g/dL and platelets 849.  BMP showed a CO2 of 18 mmol/L with a normal anion gap and a glucose of 138 mg/dL, the rest of the electrolytes and renal function were normal.  Imaging: MRI of the lumbar spine without contrast show L3-L4 central small to moderate disc extrusion with mild spinal stenosis and lateral recess involvement (descending L4 nerves).  More advanced chronic disc and endplate degeneration at L4-L5 and L5-S1, with moderate L5 nerve level a stenosis.  Enlarged uterus with multiple masses, probably corresponding to chronic fibroids.  ED course: Initial vital signs were temperature 97.8 F, pulse 78, respirations 25, BP 186/103 mmHg and O2 sat  100% on room air.  The patient received acetaminophen  1000 mg p.o. x 1, 650 mg x 1, cyclobenzaprine  10 mg p.o. x 1, ketorolac  30 mg IVP x 1, ketorolac  15 mg IVP x 1, methocarbamol  1000 mg IVPB and morphine  4 mg IVP x 1 dose.   Review of Systems: As mentioned in the history of present illness. All other systems reviewed and are negative. Past Medical History:  Diagnosis Date   Anxiety    Bipolar disorder (HCC)    Depression    Glaucoma    Hypertension    Type 2 diabetes mellitus (HCC)    Past Surgical History:  Procedure Laterality Date   eye procedure     lense implant for glaucoma   TUBAL LIGATION     Social History:  reports that she has quit smoking. Her smoking use included cigarettes. She has never been exposed to tobacco smoke. She has never used smokeless tobacco. She reports that she does not drink alcohol and does not use drugs.  Allergies[1]  Family History  Problem Relation Age of Onset   Diabetes Mother    Stroke Maternal Grandmother     Prior to Admission medications  Medication Sig Start Date End Date Taking? Authorizing Provider  Accu-Chek Softclix Lancets lancets use as directed 02/03/23   McDiarmid, Krystal BIRCH, MD  Blood Glucose Monitoring Suppl (ACCU-CHEK GUIDE) w/Device KIT USE AS DIRECTED 02/03/23   McDiarmid, Krystal BIRCH, MD  Cholecalciferol (VITAMIN D3) 1.25 MG (50000 UT) CAPS Take 1 capsule (1.25 mg total) by mouth once a week. Patient not taking: Reported on 08/17/2023 01/15/23   Dameron, Marisa, DO  diclofenac  Sodium (VOLTAREN ) 1 %  GEL Apply topically. Patient not taking: Reported on 08/17/2023 02/18/22   [provider]  dorzolamide -timolol  (COSOPT ) 2-0.5 % ophthalmic solution Place 1 drop into both eyes 2 (two) times daily. 02/15/24     fluticasone  (FLONASE ) 50 MCG/ACT nasal spray Place 2 sprays into both nostrils daily. 02/03/23   Dameron, Marisa, DO  glucose blood (ACCU-CHEK SMARTVIEW) test strip Use as instructed - test three times daily 03/03/23    McDiarmid, Krystal BIRCH, MD  latanoprost  (XALATAN ) 0.005 % ophthalmic solution Place 1 drop into both eyes at bedtime. 02/02/24     Latanoprostene Bunod  (VYZULTA ) 0.024 % SOLN Place 1 drop into both eyes at bedtime. 02/02/24     levonorgestrel -ethinyl estradiol  (SEASONALE) 0.15-0.03 MG tablet Take 1 tablet by mouth daily. 07/21/23   Ozell Heron HERO, MD  loratadine  (CLARITIN ) 10 MG tablet Take 1 tablet (10 mg total) by mouth daily. 01/15/23   Dameron, Marisa, DO  rosuvastatin  (CRESTOR ) 20 MG tablet Take 1 tablet (20 mg total) by mouth daily for cholesterol 02/08/24   Lonni Slain, MD  Semaglutide ,0.25 or 0.5MG /DOS, (OZEMPIC , 0.25 OR 0.5 MG/DOSE,) 2 MG/3ML SOPN Inject 0.5 mg into the skin once a week. 04/03/24   Ozell Heron HERO, MD  Travoprost , BAK Free, (TRAVATAN  Z) 0.004 % SOLN ophthalmic solution Place 1 drop into both eyes at bedtime. 02/15/24     Vitamin D , Ergocalciferol , (DRISDOL ) 1.25 MG (50000 UNIT) CAPS capsule Take 1 capsule (50,000 Units total) by mouth every 7 (seven) days. 04/03/24   Ozell Heron HERO, MD    Physical Exam: Vitals:   04/20/24 0030 04/20/24 0512 04/20/24 0512 04/20/24 0846  BP: (!) 177/108 (!) 155/91  (!) 140/126  Pulse: 84 82  63  Resp: 20 15  16   Temp:   98.1 F (36.7 C) 98 F (36.7 C)  TempSrc:   Oral Oral  SpO2: 100% 100%  100%  Weight:      Height:       Physical Exam Vitals and nursing note reviewed.  Constitutional:      General: She is awake. She is not in acute distress.    Appearance: She is ill-appearing.  HENT:     Head: Normocephalic.     Nose: No rhinorrhea.     Mouth/Throat:     Mouth: Mucous membranes are moist.  Eyes:     General: No scleral icterus.    Pupils: Pupils are equal, round, and reactive to light.  Neck:     Vascular: No JVD.  Cardiovascular:     Rate and Rhythm: Normal rate and regular rhythm.     Pulses: Normal pulses.     Heart sounds: Normal heart sounds, S1 normal and S2 normal.  Pulmonary:     Effort:  Pulmonary effort is normal.     Breath sounds: Normal breath sounds. No wheezing, rhonchi or rales.  Abdominal:     General: Abdomen is protuberant. Bowel sounds are normal. There is no distension.     Palpations: Abdomen is soft.     Tenderness: There is no abdominal tenderness. There is no right CVA tenderness or left CVA tenderness.  Musculoskeletal:     Cervical back: Neck supple.     Right lower leg: No edema.     Left lower leg: No edema.     Comments: Straight leg raise produces back pain bilaterally with minimal angulation.  There was paraspinal muscle spasm and tenderness on Dr. Matthias exam.  Skin:    General: Skin is warm and  dry.  Neurological:     General: No focal deficit present.     Mental Status: She is alert and oriented to person, place, and time.  Psychiatric:        Mood and Affect: Mood normal.        Behavior: Behavior normal. Behavior is cooperative.     Data Reviewed:  Results are pending, will review when available. 09/06/2018 transthoracic echocardiogram report.  IMPRESSIONS:   1. The left ventricle has low normal systolic function, with an ejection  fraction of 50-55%. The cavity size was normal. There is mildly increased  left ventricular wall thickness. codominant mitral inflow, borderline  diastolic dysfunction. No evidence  of left ventricular regional wall motion abnormalities.   2. The right ventricle has normal systolic function. The cavity was  normal. There is no increase in right ventricular wall thickness.   3. The mitral valve is abnormal. Mild thickening of the mitral valve  leaflet.   4. The aortic valve is tricuspid. No stenosis of the aortic valve.   5. The average left ventricular global longitudinal strain is -17.0 %.    Assessment and Plan: Principal Problem:   Intractable low back pain Observation/MedSurg. Analgesics as needed. Antiemetics as needed. Pantoprazole 40 mg IVP daily. Follow CBC, CMP in AM. Consider PT  evaluation if no improvement. Advised to follow-up with PCP and spine specialist as an outpatient.  Active Problems:   Type 2 diabetes mellitus without complication,    without long-term current use of insulin  (HCC) Carbohydrate modified diet. CBG monitoring with [facility] SS. Check hemoglobin A1c.    Mixed hyperlipidemia Continue rosuvastatin  20 mg p.o. daily.    Chronic diastolic heart failure (HCC) No signs of decompensation or overload.    Vitamin D  deficiency Continue vitamin D  supplement    Benign essential hypertension Not on antihypertensives after weight loss. As needed oral or parenteral hypertensives can be ordered.    Overweight (BMI 25.0-29.9) Current BMI 27.76 kg/m. Continue lifestyle modifications. Follow-up with PCP.    Glaucoma Continue Cosopt  and Xalatan  drops. Follow-up with ophthalmology as an outpatient.    Advance Care Planning:   Code Status: Full Code   Consults:   Family Communication:   Severity of Illness: The appropriate patient status for this patient is OBSERVATION. Observation status is judged to be reasonable and necessary in order to provide the required intensity of service to ensure the patient's safety. The patient's presenting symptoms, physical exam findings, and initial radiographic and laboratory data in the context of their medical condition is felt to place them at decreased risk for further clinical deterioration. Furthermore, it is anticipated that the patient will be medically stable for discharge from the hospital within 2 midnights of admission.   Author: Alm Dorn Castor, MD 04/20/2024 11:09 AM  For on call review [email or web address].   This document was prepared using Dragon voice recognition software and may contain some unintended transcription errors.      [1] No Known Allergies  "

## 2024-04-20 NOTE — ED Provider Notes (Signed)
 Patient signed out to me at 0700 by Dr. Raford pending MRI. In short this is a 54 year old female with a past medical history of hypertension, diabetes and bipolar disorder that presented to the emergency department with severe back pain.  She had no focal neurologic deficits.  Pain was able to be controlled and MRI.  She has received morphine , Tylenol , Robaxin  and Toradol .  Patient's urine was negative for UTI.  Patient's imaging did show L3-L4 disc extrusion with mild spinal stenosis and L5 disc degeneration with severe stenosis as well as fibroids in her uterus.  No spinal cord compression.  On my evaluation, the patient reports that her pain is better from when she arrived to the ED but she states that it is too severe still that she does not feel like she is able to walk or go home.  The patient has sensation intact and 5 out of 5 strength in hip flexion, knee extension and plantar/dorsi flexion bilaterally.  Patient will be given additional pain control at this time and will have labs with plan for admission.  10:38 AM Labs within normal range. Plan for admission for pain control and PT eval.    Kingsley, Miliyah Luper K, DO 04/20/24 1038

## 2024-04-21 DIAGNOSIS — M5459 Other low back pain: Secondary | ICD-10-CM | POA: Diagnosis not present

## 2024-04-21 LAB — CBC
HCT: 34 % — ABNORMAL LOW (ref 36.0–46.0)
Hemoglobin: 11 g/dL — ABNORMAL LOW (ref 12.0–15.0)
MCH: 25.6 pg — ABNORMAL LOW (ref 26.0–34.0)
MCHC: 32.4 g/dL (ref 30.0–36.0)
MCV: 79.1 fL — ABNORMAL LOW (ref 80.0–100.0)
Platelets: 135 K/uL — ABNORMAL LOW (ref 150–400)
RBC: 4.3 MIL/uL (ref 3.87–5.11)
RDW: 15.3 % (ref 11.5–15.5)
WBC: 6.3 K/uL (ref 4.0–10.5)
nRBC: 0 % (ref 0.0–0.2)

## 2024-04-21 LAB — COMPREHENSIVE METABOLIC PANEL WITH GFR
ALT: <5 U/L (ref 0–44)
AST: 14 U/L — ABNORMAL LOW (ref 15–41)
Albumin: 3.4 g/dL — ABNORMAL LOW (ref 3.5–5.0)
Alkaline Phosphatase: 76 U/L (ref 38–126)
Anion gap: 9 (ref 5–15)
BUN: 13 mg/dL (ref 6–20)
CO2: 20 mmol/L — ABNORMAL LOW (ref 22–32)
Calcium: 8.5 mg/dL — ABNORMAL LOW (ref 8.9–10.3)
Chloride: 106 mmol/L (ref 98–111)
Creatinine, Ser: 0.94 mg/dL (ref 0.44–1.00)
GFR, Estimated: >60 mL/min
Glucose, Bld: 160 mg/dL — ABNORMAL HIGH (ref 70–99)
Potassium: 4.2 mmol/L (ref 3.5–5.1)
Sodium: 136 mmol/L (ref 135–145)
Total Bilirubin: <0.2 mg/dL (ref 0.0–1.2)
Total Protein: 5.8 g/dL — ABNORMAL LOW (ref 6.5–8.1)

## 2024-04-21 LAB — HEMOGLOBIN A1C
Hgb A1c MFr Bld: 7.7 % — ABNORMAL HIGH (ref 4.8–5.6)
Mean Plasma Glucose: 174.29 mg/dL

## 2024-04-21 LAB — HIV ANTIBODY (ROUTINE TESTING W REFLEX): HIV Screen 4th Generation wRfx: NONREACTIVE

## 2024-04-21 MED ORDER — METHYLPREDNISOLONE 4 MG PO TBPK
4.0000 mg | ORAL_TABLET | Freq: Three times a day (TID) | ORAL | Status: DC
  Administered 2024-04-22 (×2): 4 mg via ORAL

## 2024-04-21 MED ORDER — METHYLPREDNISOLONE 4 MG PO TBPK: 8.0000 mg | ORAL_TABLET | Freq: Every evening | ORAL | Status: DC

## 2024-04-21 MED ORDER — METHYLPREDNISOLONE 4 MG PO TBPK
8.0000 mg | ORAL_TABLET | Freq: Every morning | ORAL | Status: AC
  Administered 2024-04-21: 8 mg via ORAL
  Filled 2024-04-21: qty 21

## 2024-04-21 MED ORDER — HYDRALAZINE HCL 20 MG/ML IJ SOLN
5.0000 mg | Freq: Four times a day (QID) | INTRAMUSCULAR | Status: DC | PRN
  Administered 2024-04-21: 5 mg via INTRAVENOUS
  Filled 2024-04-21 (×2): qty 1

## 2024-04-21 MED ORDER — METHOCARBAMOL 500 MG PO TABS
500.0000 mg | ORAL_TABLET | Freq: Three times a day (TID) | ORAL | Status: DC
  Administered 2024-04-21 – 2024-04-22 (×4): 500 mg via ORAL
  Filled 2024-04-21 (×5): qty 1

## 2024-04-21 MED ORDER — METHYLPREDNISOLONE 4 MG PO TBPK
8.0000 mg | ORAL_TABLET | Freq: Every evening | ORAL | Status: AC
  Administered 2024-04-21: 8 mg via ORAL

## 2024-04-21 MED ORDER — OXYCODONE HCL 5 MG PO TABS
5.0000 mg | ORAL_TABLET | Freq: Four times a day (QID) | ORAL | Status: DC | PRN
  Administered 2024-04-21 – 2024-04-22 (×3): 10 mg via ORAL
  Filled 2024-04-21 (×3): qty 2

## 2024-04-21 MED ORDER — LIDOCAINE 5 % EX PTCH
1.0000 | MEDICATED_PATCH | CUTANEOUS | Status: DC
  Administered 2024-04-21: 1 via TRANSDERMAL
  Filled 2024-04-21: qty 1

## 2024-04-21 MED ORDER — METHYLPREDNISOLONE 4 MG PO TBPK: 4.0000 mg | ORAL_TABLET | Freq: Four times a day (QID) | ORAL | Status: DC

## 2024-04-21 MED ORDER — METHYLPREDNISOLONE 4 MG PO TBPK
4.0000 mg | ORAL_TABLET | ORAL | Status: AC
  Administered 2024-04-21: 4 mg via ORAL

## 2024-04-21 MED ORDER — LIDOCAINE 5 % EX PTCH
2.0000 | MEDICATED_PATCH | CUTANEOUS | Status: DC
  Administered 2024-04-21 – 2024-04-22 (×2): 2 via TRANSDERMAL
  Filled 2024-04-21 (×2): qty 2

## 2024-04-21 NOTE — Plan of Care (Signed)
  Problem: Clinical Measurements: Goal: Ability to maintain clinical measurements within normal limits will improve Outcome: Progressing Goal: Respiratory complications will improve Outcome: Progressing Goal: Cardiovascular complication will be avoided Outcome: Progressing   Problem: Coping: Goal: Level of anxiety will decrease Outcome: Progressing   Problem: Pain Managment: Goal: General experience of comfort will improve and/or be controlled Outcome: Progressing

## 2024-04-21 NOTE — Progress Notes (Signed)
 " PROGRESS NOTE  Teresa Chang FMW:[id number] DOB: Mar 15, 1971 DOA: 04/19/2024 PCP: Ozell Heron HERO, MD   LOS: 0 days   Brief Narrative / Interim history: 54 year old female history of anxiety, depression, HTN, DM2, chronic diastolic CHF who comes into the hospital with complaints of back pain.  Back pain is severe, mainly lower back, bandlike.  There is no radiation into her legs.  She has a similar episode about 20 years ago that resolved fairly quickly.  Subjective / 24h Interval events: Continues to complain of unresolving back pain, quite severe at times.  She is barely able to move.  Assesement and Plan: Principal problem Intractable low back pain -on MRI of the back obtained on admission showed L3-4 central small to moderate disc extrusion with mild spinal stenosis and lateral recess involvement, also chronic disc and endplate degeneration at L4-L5, L5-S1 with moderate L5 nerve level stenosis.  Continue supportive care, multimodal analgesics with muscle relaxers, pain control, lidocaine  patch as well as a Medrol  Dosepak - PT consult pending  Active Problems: Type 2 diabetes mellitus without complication, without long-term current use of insulin  - Carbohydrate modified diet.  A1c 7.7, glucose monitored as below, stable  Lab Results  Component Value Date   HGBA1C 7.7 (H) 04/21/2024   CBG (last 3)  Recent Labs    04/20/24 0024 04/20/24 0950  GLUCAP 170* 134*   Mixed hyperlipidemia - Continue statin   Chronic diastolic heart failure - No signs of decompensation or overload.   Vitamin D  deficiency - Continue vitamin D  supplement   Benign essential hypertension - Not on antihypertensives after weight loss.  Blood pressure high but she is in pain.  Continue to monitor   Glaucoma - Continue Cosopt  and Xalatan  drops. Follow-up with ophthalmology as an outpatient.  Scheduled Meds:  dorzolamide -timolol   1 drop Both Eyes BID   enoxaparin  (LOVENOX ) injection  40 mg Subcutaneous  Q24H   latanoprost   1 drop Both Eyes QHS   lidocaine   2 patch Transdermal Q24H   methocarbamol   500 mg Oral TID   methylPREDNISolone   4 mg Oral PC lunch   methylPREDNISolone   4 mg Oral PC supper   [START ON 04/22/2024] methylPREDNISolone   4 mg Oral 3 x daily with food   [START ON 04/23/2024] methylPREDNISolone   4 mg Oral 4X daily taper   methylPREDNISolone   8 mg Oral Nightly   [START ON 04/22/2024] methylPREDNISolone   8 mg Oral Nightly    morphine  injection  4 mg Intravenous Once   rosuvastatin   20 mg Oral Daily   Continuous Infusions: PRN Meds:.acetaminophen  **OR** acetaminophen , hydrALAZINE , ondansetron  **OR** ondansetron  (ZOFRAN ) IV, oxyCODONE   Current Outpatient Medications  Medication Instructions   acetaminophen  (TYLENOL ) 1,000 mg, 2 times daily PRN   dorzolamide -timolol  (COSOPT ) 2-0.5 % ophthalmic solution 1 drop, Both Eyes, 2 times daily   fluticasone  (FLONASE ) 50 MCG/ACT nasal spray 2 sprays, Each Nare, Daily   loratadine  (CLARITIN ) 10 mg, Oral, Daily   Ozempic  (0.25 or 0.5 MG/DOSE) 0.5 mg, Subcutaneous, Weekly   rosuvastatin  (CRESTOR ) 20 MG tablet Take 1 tablet (20 mg total) by mouth daily for cholesterol   Travoprost , BAK Free, (TRAVATAN  Z) 0.004 % SOLN ophthalmic solution 1 drop, Both Eyes, Nightly   Vitamin D  (Ergocalciferol ) (DRISDOL ) 50,000 Units, Oral, Every 7 days    Diet Orders (From admission, onward)     Start     Ordered   04/20/24 0901  Diet Carb Modified Room service appropriate? Yes  Diet effective now  Question Answer Comment  Diet-HS Snack? Nothing   Calorie Level Medium 1600-2000   Fluid consistency: Thin   Room service appropriate? Yes      04/20/24 0901            DVT prophylaxis: enoxaparin  (LOVENOX ) injection 40 mg Start: 04/20/24 2200   Lab Results  Component Value Date   PLT 135 (L) 04/21/2024      Code Status: Full Code  Family Communication: No family at bedside  Status is: Observation The patient will require care  spanning > 2 midnights and should be moved to inpatient because: Intractable back pain   Level of care: Med-Surg  Consultants:  none  Objective: Vitals:   04/20/24 2009 04/21/24 0247 04/21/24 0451 04/21/24 0646  BP: (!) 157/88 (!) 178/94 (!) 196/116 (!) 180/99  Pulse: 78 72 64 61  Resp: 18 18 20    Temp: 98.7 F (37.1 C) 98.4 F (36.9 C) 98.5 F (36.9 C)   TempSrc:  Oral Oral   SpO2: 100% 100%    Weight:      Height:        Intake/Output Summary (Last 24 hours) at 04/21/2024 1102 Last data filed at 04/21/2024 0900 Gross per 24 hour  Intake 560 ml  Output --  Net 560 ml   Wt Readings from Last 3 Encounters:  04/20/24 80.4 kg  08/17/23 85.3 kg  07/18/23 86.5 kg    Examination:  Constitutional: NAD Eyes: no scleral icterus ENMT: Mucous membranes are moist.  Neck: normal, supple Respiratory: clear to auscultation bilaterally, no wheezing, no crackles. Normal respiratory effort. Cardiovascular: Regular rate and rhythm, no murmurs / rubs / gallops. No LE edema.  Abdomen: non distended, no tenderness. Bowel sounds positive.  Musculoskeletal: no clubbing / cyanosis.  Neurologic: non focal  Data Reviewed: I have independently reviewed following labs and imaging studies   CBC Recent Labs  Lab 04/20/24 1006 04/21/24 0549  WBC 7.1 6.3  HGB 11.8* 11.0*  HCT 37.8 34.0*  PLT 150 135*  MCV 80.8 79.1*  MCH 25.2* 25.6*  MCHC 31.2 32.4  RDW 15.1 15.3  LYMPHSABS 2.1  --   MONOABS 0.6  --   EOSABS 0.1  --   BASOSABS 0.1  --     Recent Labs  Lab 04/20/24 1006 04/21/24 0549 04/21/24 0550  NA 137 136  --   K 4.1 4.2  --   CL 109 106  --   CO2 18* 20*  --   GLUCOSE 138* 160*  --   BUN 10 13  --   CREATININE 0.94 0.94  --   CALCIUM  8.9 8.5*  --   AST  --  14*  --   ALT  --  <5  --   ALKPHOS  --  76  --   BILITOT  --  <0.2  --   ALBUMIN  --  3.4*  --   HGBA1C  --   --  7.7*     ------------------------------------------------------------------------------------------------------------------ No results for input(s): CHOL, HDL, LDLCALC, TRIG, CHOLHDL, LDLDIRECT in the last 72 hours.  Lab Results  Component Value Date   HGBA1C 7.7 (H) 04/21/2024   ------------------------------------------------------------------------------------------------------------------ No results for input(s): TSH, T4TOTAL, T3FREE, THYROIDAB in the last 72 hours.  Invalid input(s): FREET3  Cardiac Enzymes No results for input(s): CKMB, TROPONINI, MYOGLOBIN in the last 168 hours.  Invalid input(s): CK ------------------------------------------------------------------------------------------------------------------    Component Value Date/Time   BNP 9.4 10/05/2022 1525   BNP 4.6 02/19/2011 1109  CBG: Recent Labs  Lab 04/20/24 0024 04/20/24 0950  GLUCAP 170* 134*    No results found for this or any previous visit (from the past 240 hours).   Radiology Studies: No results found.   Nilda Fendt, MD, PhD [facility]  Between 7 am - 7 pm I am available, please contact me via Amion (for emergencies) or Securechat (non urgent messages)  Between 7 pm - 7 am I am not available, please contact night coverage MD/APP via Amion  "

## 2024-04-21 NOTE — Plan of Care (Signed)

## 2024-04-22 ENCOUNTER — Other Ambulatory Visit (HOSPITAL_COMMUNITY): Payer: Self-pay

## 2024-04-22 DIAGNOSIS — M5459 Other low back pain: Secondary | ICD-10-CM | POA: Diagnosis not present

## 2024-04-22 LAB — CBC
HCT: 35.5 % — ABNORMAL LOW (ref 36.0–46.0)
Hemoglobin: 11.5 g/dL — ABNORMAL LOW (ref 12.0–15.0)
MCH: 25.6 pg — ABNORMAL LOW (ref 26.0–34.0)
MCHC: 32.4 g/dL (ref 30.0–36.0)
MCV: 79.1 fL — ABNORMAL LOW (ref 80.0–100.0)
Platelets: 157 K/uL (ref 150–400)
RBC: 4.49 MIL/uL (ref 3.87–5.11)
RDW: 14.8 % (ref 11.5–15.5)
WBC: 8.9 K/uL (ref 4.0–10.5)
nRBC: 0 % (ref 0.0–0.2)

## 2024-04-22 LAB — COMPREHENSIVE METABOLIC PANEL WITH GFR
ALT: <5 U/L (ref 0–44)
AST: 13 U/L — ABNORMAL LOW (ref 15–41)
Albumin: 3.8 g/dL (ref 3.5–5.0)
Alkaline Phosphatase: 72 U/L (ref 38–126)
Anion gap: 9 (ref 5–15)
BUN: 12 mg/dL (ref 6–20)
CO2: 22 mmol/L (ref 22–32)
Calcium: 9.2 mg/dL (ref 8.9–10.3)
Chloride: 105 mmol/L (ref 98–111)
Creatinine, Ser: 0.86 mg/dL (ref 0.44–1.00)
GFR, Estimated: >60 mL/min
Glucose, Bld: 204 mg/dL — ABNORMAL HIGH (ref 70–99)
Potassium: 4.7 mmol/L (ref 3.5–5.1)
Sodium: 136 mmol/L (ref 135–145)
Total Bilirubin: 0.3 mg/dL (ref 0.0–1.2)
Total Protein: 6.4 g/dL — ABNORMAL LOW (ref 6.5–8.1)

## 2024-04-22 LAB — MAGNESIUM: Magnesium: 1.8 mg/dL (ref 1.7–2.4)

## 2024-04-22 MED ORDER — METHYLPREDNISOLONE 4 MG PO TABS
ORAL_TABLET | ORAL | 0 refills | Status: AC
  Filled 2024-04-22: qty 8, 3d supply, fill #0

## 2024-04-22 MED ORDER — LIDOCAINE 5 % EX PTCH
2.0000 | MEDICATED_PATCH | CUTANEOUS | 0 refills | Status: AC
  Filled 2024-04-22: qty 30, 15d supply, fill #0

## 2024-04-22 MED ORDER — OXYCODONE HCL 5 MG PO TABS
5.0000 mg | ORAL_TABLET | Freq: Four times a day (QID) | ORAL | 0 refills | Status: AC | PRN
  Filled 2024-04-22: qty 20, 3d supply, fill #0

## 2024-04-22 MED ORDER — METHOCARBAMOL 500 MG PO TABS
500.0000 mg | ORAL_TABLET | Freq: Three times a day (TID) | ORAL | 0 refills | Status: AC | PRN
  Filled 2024-04-22: qty 20, 7d supply, fill #0

## 2024-04-22 NOTE — Evaluation (Addendum)
 Occupational Therapy Evaluation Patient Details Name: Teresa Chang MRN: [id number] DOB: [birth year] Today's Date: 04/22/2024   History of Present Illness   54 year old female admitted with severe lower back pain. 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 PMH: anxiety, depression, HTN, DM2, chronic diastolic CHF.     Clinical Impressions PTA pt lives independently without use of an AD. Educated pt on use of AE, DME and compensatory strategies for ADL tasks and functional mobility. Pt overall modified independent with use of AE (will issue) and DME listed below. No further acute OT needs.      If plan is discharge home, recommend the following:   A lot of help with bathing/dressing/bathroom;Assistance with cooking/housework     Functional Status Assessment   Patient has had a recent decline in their functional status and demonstrates the ability to make significant improvements in function in a reasonable and predictable amount of time.     Equipment Recommendations   BSC/3in1;Other (comment) (RW)     Recommendations for Other Services         Precautions/Restrictions   Precautions Precautions: Back;Other (comment);Fall (for comfort) Restrictions Weight Bearing Restrictions Per Provider Order: No     Mobility Bed Mobility Overal bed mobility: Modified Independent             General bed mobility comments: educated on log rolling technique - able to return demonstrate in her won way    Transfers Overall transfer level: Needs assistance Equipment used: None Transfers: Sit to/from Stand Sit to Stand: Contact guard assist, From elevated surface           General transfer comment: elevated bed height to simulate home environment.      Balance Overall balance assessment: Mild  deficits observed, not formally tested                                         ADL either performed or assessed with clinical judgement   ADL Overall ADL's : Needs assistance/impaired     Grooming: Modified independent   Upper Body Bathing: Modified independent   Lower Body Bathing: Minimal assistance   Upper Body Dressing : Modified independent   Lower Body Dressing: Minimal assistance   Toilet Transfer: Supervision/safety; educated on using 3in1 over toilet and pushing to stand using armrests    States it is more comfortable to shower in standing position. Reviewed use of long handled sponge. Pt verbalized understanding.       Functional mobility during ADLs: Supervision/safety;Rolling walker (2 wheels) (Educated pt on use of long handles sponge for bathing and use of reacher and sock aid for LB dressing. Pt modified independent with use of AE)       Vision Baseline Vision/History: 1 Wears glasses       Perception         Praxis         Pertinent Vitals/Pain Pain Assessment Pain Assessment: 0-10 Pain Score: 8  Pain Location: lower back Pain Descriptors / Indicators: Aching, Grimacing, Guarding, Jabbing Pain Intervention(s): Limited activity within patient's tolerance, Premedicated before session     Extremity/Trunk Assessment Upper Extremity Assessment Upper Extremity Assessment: Overall WFL for tasks assessed   Lower Extremity Assessment Lower Extremity Assessment: Defer to PT evaluation  Cervical / Trunk Assessment Cervical / Trunk Assessment:  (trunk flexed due to back pain)   Communication Communication Communication: No apparent difficulties   Cognition Arousal: Alert Behavior During Therapy: WFL for tasks assessed/performed Cognition: No apparent impairments                               Following commands: Intact       Cueing  General Comments          Exercises Exercises: Other exercises Other  Exercises Other Exercises: encouraged breathing techniques to reduce pain during mobility   Shoulder Instructions      Home Living Family/patient expects to be discharged to:: Private residence Living Arrangements: Alone Available Help at Discharge: Friend(s);Family Type of Home: House Home Access: Stairs to enter [employer] of Steps: 3 Entrance Stairs-Rails: None Home Layout: One level     Bathroom Shower/Tub: Walk-in shower;Tub/shower unit   Bathroom Toilet: Standard Bathroom Accessibility: Yes How Accessible: Accessible via walker Home Equipment: None   Additional Comments: significant other PRN works 12hrs/day. 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 Son lives away works 3rd shift. Friends live close by if needed something.      Prior Functioning/Environment Prior Level of Function : Independent/Modified Independent;Driving;Working/employed             Mobility Comments: no falls, no AD at baseline.      OT Problem List: Decreased knowledge of use of DME or AE;Decreased knowledge of precautions;Pain   OT Treatment/Interventions:        OT Goals(Current goals can be found in the care plan section)   Acute Rehab OT Goals Patient Stated Goal: take care of herself OT Goal Formulation: All assessment and education complete, DC therapy   OT Frequency:       Co-evaluation              AM-PAC OT 6 Clicks Daily Activity     Outcome Measure Help from another person eating meals?: None Help from another person taking care of personal grooming?: None Help from another person toileting, which includes using toliet, bedpan, or urinal?: A Little Help from another person bathing (including washing, rinsing, drying)?: A Little Help from another person to put on and taking off regular upper body clothing?: A Little Help from another person to put on and taking off regular lower body clothing?: A Little 6 Click Score: 20   End  of Session Equipment Utilized During Treatment: Gait belt;Rolling walker (2 wheels) Nurse Communication: Mobility status;Other (comment) (DC needs)  Activity Tolerance: Patient tolerated treatment well Patient left: in bed;with call bell/phone within reach  OT Visit Diagnosis: Other abnormalities of gait and mobility (R26.89);Pain Pain - part of body:  (back)                Time: 8850-8784 OT Time Calculation (min): 26 min Charges:  OT General Charges $OT Visit: 1 Visit OT Evaluation $OT Eval Low Complexity: 1 Low  Fredricka Kohrs, OT/L   Acute OT Clinical Specialist Acute Rehabilitation Services Pager [phone] Office [phone]   [facility] 04/22/2024, 12:36 PM

## 2024-04-22 NOTE — Discharge Summary (Signed)
 "  Physician Discharge Summary  Teresa Chang FMW:[id number] DOB: 08-13-1970 DOA: 04/19/2024  PCP: Ozell Heron HERO, MD  Admit date: 04/19/2024 Discharge date: 04/22/2024  Admitted From: home Disposition:  home  Recommendations for Outpatient Follow-up:  Follow up with PCP in 1-2 weeks  Home Health: none Equipment/Devices: none  Discharge Condition: stable CODE STATUS: Full code Diet Orders (From admission, onward)     Start     Ordered   04/20/24 0901  Diet Carb Modified Room service appropriate? Yes  Diet effective now       Question Answer Comment  Diet-HS Snack? Nothing   Calorie Level Medium 1600-2000   Fluid consistency: Thin   Room service appropriate? Yes      04/20/24 0901            Brief Narrative / Interim history: 54 year old female history of anxiety, depression, HTN, DM2, chronic diastolic CHF who comes into the hospital with complaints of back pain.  Back pain is severe, mainly lower back, bandlike.  There is no radiation into her legs.  She has a similar episode about 20 years ago that resolved fairly quickly.  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  Continue supportive care, multimodal analgesics with muscle relaxers, pain control, lidocaine  patch as well as a Medrol  Dosepak.  She improved, PT/OT were able to work with the patient, she is better, will be discharged home in stable condition with symptomatic management.   Active Problems: Type 2 diabetes mellitus without complication, without long-term current use of insulin  -  A1c 7.7 Mixed hyperlipidemia - Continue statin Chronic diastolic heart failure - No signs of decompensation or overload. Vitamin D   deficiency - Continue vitamin D  supplement Benign essential hypertension - Not on antihypertensives after weight loss.  Blood pressure high but she is in pain.   Glaucoma - Continue home medications  Sepsis ruled out   Discharge Instructions   Allergies as of 04/22/2024   No Known Allergies      Medication List     TAKE these medications    acetaminophen  500 MG tablet Commonly known as: TYLENOL  Take 1,000 mg by mouth 2 (two) times daily as needed for fever or headache (pain).   dorzolamide -timolol  2-0.5 % ophthalmic solution Commonly known as: COSOPT  Place 1 drop into both eyes 2 (two) times daily.   fluticasone  50 MCG/ACT nasal spray Commonly known as: FLONASE  Place 2 sprays into both nostrils daily. What changed:  how much to take when to take this reasons to take this   lidocaine  5 % Commonly known as: LIDODERM  Place 2 patches onto the skin daily. Remove & Discard patch within 12 hours or as directed by MD Start taking on: April 23, 2024   loratadine  10 MG tablet Commonly known as: CLARITIN  Take 1 tablet (10 mg total) by mouth daily. What changed:  when to take this reasons to take this   methocarbamol  500 MG tablet Commonly known as: ROBAXIN  Take 1 tablet (500 mg total) by mouth every 8 (eight) hours as needed for muscle spasms.   methylPREDNISolone  4 MG Tbpk tablet Commonly known as: MEDROL  DOSEPAK Take 1 tablet (4 mg total) by mouth 4 (four) times daily for 1 day, THEN 1 tablet (4 mg total)  3 (three) times daily for 1 day, THEN 1 tablet (4 mg total) in the morning and at bedtime for 1 day, THEN 1 tablet (4 mg total) every morning for 1 day. Start taking on: April 22, 2024   oxyCODONE  5 MG immediate release tablet Commonly known as: Oxy IR/ROXICODONE  Take 1-2 tablets (5-10 mg total) by mouth every 6 (six) hours as needed for moderate pain (pain score 4-6).   Ozempic  (0.25 or 0.5 MG/DOSE) 2 MG/3ML Sopn Generic drug: Semaglutide (0.25 or  0.5MG /DOS) Inject 0.5 mg into the skin once a week.   rosuvastatin  20 MG tablet Commonly known as: CRESTOR  Take 1 tablet (20 mg total) by mouth daily for cholesterol   Travatan  Z 0.004 % Soln ophthalmic solution Generic drug: Travoprost  (BAK Free) Place 1 drop into both eyes at bedtime.   Vitamin D  (Ergocalciferol ) 1.25 MG (50000 UNIT) Caps capsule Commonly known as: DRISDOL  Take 1 capsule (50,000 Units total) by mouth every 7 (seven) days. What changed: when to take this               Durable Medical Equipment  (From admission, onward)           Start     Ordered   04/22/24 1245  For home use only DME 3 n 1  Once        04/22/24 1244   04/22/24 1118  For home use only DME Walker rolling  Once       Question Answer Comment  Walker: With 5 Inch Wheels   Patient needs a walker to treat with the following condition Back pain      04/22/24 1117            Follow-up Information     Ozell Heron HERO, MD Follow up in 1 week(s).   Specialty: Family Medicine Contact information: [address] [place] [address] [phone]                 Consultations: none  Procedures/Studies:  MR LUMBAR SPINE WO CONTRAST Result Date: 04/20/2024 EXAM: MRI LUMBAR SPINE 04/20/2024 07:59:31 AM TECHNIQUE: Multiplanar multisequence MRI of the lumbar spine was performed without the administration of intravenous contrast. COMPARISON: Pelvis ultrasound 08/05/2023 and lumbar radiographs 07/31/2005. CLINICAL HISTORY: 54 year old female. Low back pain, cauda equina syndrome suspected. FINDINGS: Normal lumbar segmentation on the comparison radiographs. BONES AND ALIGNMENT: Mild chronic levoconvex lumbar scoliosis. Chronic straightening of lumbar lordosis. Chronic degenerative endplate spurring and marrow signal changes in the lower lumbar spine and at the lumbosacral junction. Maintained vertebral height. No marrow edema. Intact visible sacrum and SI joints. Background  marrow signal is normal. SPINAL CORD: Normal conus medullaris at T11-T12. SOFT TISSUES: No paraspinal mass. Abnormally enlarged uterus partially visible in the pelvis and at the sacral promontory and appears related to multiple uterine masses which might be fibroids (for example series 6 image 4). DEGENERATIVE: Visible lower thoracic levels through L2-L3 are negative for age. L3-L4: Disc desiccation with mild circumferential disc bulge. Midline and slightly caudal disc extrusion (series 8 image 25 and series 5 image 10) is small to moderate. Mild posterior element hypertrophy. Mild spinal stenosis, and disc material in proximity to the descending L4 nerve roots in the bilateral lateral recesses. No significant foraminal involvement or stenosis. L4-L5: Advanced disc space loss and disc signal changes. Circumferential disc osteophyte complex asymmetric to the right. Mild posterior element hypertrophy. Moderate right lateral recess stenosis open for descending right L5 nerve level (series 8 image  28). No spinal stenosis. No significant foraminal stenosis. L5-S1: Similar advanced disc space loss. Bulky circumferential disc osteophyte complex asymmetric to the right. Moderate facet and ligament flavum hypertrophy. Epidural lipomatosis in large part effaces CSF from the thecal sac at this level. Mild lateral recess stenosis at the bilateral descending S1 nerve levels. Moderate bilateral L5 foraminal stenosis. IMPRESSION: 1. L3-L4 central small to moderate disc extrusion with mild spinal stenosis and lateral recess involvement (descending L4 nerves). 2. More advanced chronic disc and endplate degeneration at L4 L5 and L5-S1 , with moderate L5 nerve level stenosis. 3. Enlarged uterus with multiple masses, probably corresponding to chronic fibroids (series 6 image 4). Electronically signed by: Helayne Hurst MD MD 04/20/2024 08:24 AM EST RP Workstation: [id number]     Subjective: - no chest pain, shortness of breath, no  abdominal pain, nausea or vomiting.   Discharge Exam: BP (!) 176/98 (BP Location: Left Arm)   Pulse 60   Temp 98.9 F (37.2 C) (Oral)   Resp 18   Ht 5' 7 (1.702 m)   Wt 80.4 kg   SpO2 97%   BMI 27.76 kg/m   General: Pt is alert, awake, not in acute distress Cardiovascular: RRR, S1/S2 +, no rubs, no gallops Respiratory: CTA bilaterally, no wheezing, no rhonchi Abdominal: Soft, NT, ND, bowel sounds + Extremities: no edema, no cyanosis    The results of significant diagnostics from this hospitalization (including imaging, microbiology, ancillary and laboratory) are listed below for reference.     Microbiology: No results found for this or any previous visit (from the past 240 hours).   Labs: Basic Metabolic Panel: Recent Labs  Lab 04/20/24 1006 04/21/24 0549 04/22/24 0619  NA 137 136 136  K 4.1 4.2 4.7  CL 109 106 105  CO2 18* 20* 22  GLUCOSE 138* 160* 204*  BUN 10 13 12   CREATININE 0.94 0.94 0.86  CALCIUM  8.9 8.5* 9.2  MG  --   --  1.8   Liver Function Tests: Recent Labs  Lab 04/21/24 0549 04/22/24 0619  AST 14* 13*  ALT <5 <5  ALKPHOS 76 72  BILITOT <0.2 0.3  PROT 5.8* 6.4*  ALBUMIN 3.4* 3.8   CBC: Recent Labs  Lab 04/20/24 1006 04/21/24 0549 04/22/24 0619  WBC 7.1 6.3 8.9  NEUTROABS 4.3  --   --   HGB 11.8* 11.0* 11.5*  HCT 37.8 34.0* 35.5*  MCV 80.8 79.1* 79.1*  PLT 150 135* 157   CBG: Recent Labs  Lab 04/20/24 0024 04/20/24 0950  GLUCAP 170* 134*   Hgb A1c Recent Labs    04/21/24 0550  HGBA1C 7.7*   Lipid Profile No results for input(s): CHOL, HDL, LDLCALC, TRIG, CHOLHDL, LDLDIRECT in the last 72 hours. Thyroid  function studies No results for input(s): TSH, T4TOTAL, T3FREE, THYROIDAB in the last 72 hours.  Invalid input(s): FREET3 Urinalysis    Component Value Date/Time   COLORURINE STRAW (A) 04/20/2024 0019   APPEARANCEUR CLEAR 04/20/2024 0019   LABSPEC 1.010 04/20/2024 0019   PHURINE 6.0  04/20/2024 0019   GLUCOSEU NEGATIVE 04/20/2024 0019   HGBUR NEGATIVE 04/20/2024 0019   BILIRUBINUR NEGATIVE 04/20/2024 0019   BILIRUBINUR negative 04/20/2022 1136   KETONESUR NEGATIVE 04/20/2024 0019   PROTEINUR NEGATIVE 04/20/2024 0019   UROBILINOGEN 0.2 04/20/2022 1136   NITRITE NEGATIVE 04/20/2024 0019   LEUKOCYTESUR NEGATIVE 04/20/2024 0019    FURTHER DISCHARGE INSTRUCTIONS:   Get Medicines reviewed and adjusted: Please take all your medications with you for your  next visit with your Primary MD   Laboratory/radiological data: Please request your Primary MD to go over all hospital tests and procedure/radiological results at the follow up, please ask your Primary MD to get all Hospital records sent to his/her office.   In some cases, they will be blood work, cultures and biopsy results pending at the time of your discharge. Please request that your primary care M.D. goes through all the records of your hospital data and follows up on these results.   Also Note the following: If you experience worsening of your admission symptoms, develop shortness of breath, life threatening emergency, suicidal or homicidal thoughts you must seek medical attention immediately by calling 911 or calling your MD immediately  if symptoms less severe.   You must read complete instructions/literature along with all the possible adverse reactions/side effects for all the Medicines you take and that have been prescribed to you. Take any new Medicines after you have completely understood and accpet all the possible adverse reactions/side effects.    Do not drive when taking Pain medications or sleeping medications (Benzodaizepines)   Do not take more than prescribed Pain, Sleep and Anxiety Medications. It is not advisable to combine anxiety,sleep and pain medications without talking with your primary care practitioner   Special Instructions: If you have smoked or chewed Tobacco  in the last 2 yrs please stop  smoking, stop any regular Alcohol  and or any Recreational drug use.   Wear Seat belts while driving.   Please note: You were cared for by a hospitalist during your hospital stay. Once you are discharged, your primary care physician will handle any further medical issues. Please note that NO REFILLS for any discharge medications will be authorized once you are discharged, as it is imperative that you return to your primary care physician (or establish a relationship with a primary care physician if you do not have one) for your post hospital discharge needs so that they can reassess your need for medications and monitor your lab values.  Time coordinating discharge: 35 minutes  SIGNED:  Nilda Fendt, MD, PhD 04/22/2024, 12:51 PM   "

## 2024-04-22 NOTE — Progress Notes (Signed)
 "  Nursing Discharge Note   Name: Teresa Chang MRN: [id number] DOB: 16-Mar-1971    Admit Date:  04/19/2024  Discharge Date:  04/22/2024   Mitzie DELENA Chang is to be discharged home per MD order.  AVS completed. Reviewed with patient and family at bedside. Highlighted copy provided for patient to take home.  Patient/caregiver able to verbalize understanding of discharge instructions. PIV removed. Patient stable upon discharge.  TOC medications and DME sent home with patient.      Discharge Instructions      Follow with Ozell Heron HERO, MD in 5-7 days  Please get a complete blood count and chemistry panel checked by your Primary MD at your next visit, and again as instructed by your Primary MD. Please get your medications reviewed and adjusted by your Primary MD.  Please request your Primary MD to go over all Hospital Tests and Procedure/Radiological results at the follow up, please get all Hospital records sent to your Prim MD by signing hospital release before you go home.  In some cases, there will be blood work, cultures and biopsy results pending at the time of your discharge. Please request that your primary care M.D. goes through all the records of your hospital data and follows up on these results.  If you had Pneumonia of Lung problems at the Hospital: Please get a 2 view Chest X ray done in 6-8 weeks after hospital discharge or sooner if instructed by your Primary MD.  If you have Congestive Heart Failure: Please call your Cardiologist or Primary MD anytime you have any of the following symptoms:  1) 3 pound weight gain in 24 hours or 5 pounds in 1 week  2) shortness of breath, with or without a dry hacking cough  3) swelling in the hands, feet or stomach  4) if you have to sleep on extra pillows at night in order to breathe  Follow cardiac low salt diet and 1.5 lit/day fluid restriction.  If you have diabetes Accuchecks 4 times/day, Once in AM empty stomach and then  before each meal. Log in all results and show them to your primary doctor at your next visit. If any glucose reading is under 80 or above 300 call your primary MD immediately.  If you have Seizure/Convulsions/Epilepsy: Please do not drive, operate heavy machinery, participate in activities at heights or participate in high speed sports until you have seen by Primary MD or a Neurologist and advised to do so again. Per [place]  DMV statutes, patients with seizures are not allowed to drive until they have been seizure-free for six months.  Use caution when using heavy equipment or power tools. Avoid working on ladders or at heights. Take showers instead of baths. Ensure the water temperature is not too high on the home water heater. Do not go swimming alone. Do not lock yourself in a room alone (i.e. bathroom). When caring for infants or small children, sit down when holding, feeding, or changing them to minimize risk of injury to the child in the event you have a seizure. Maintain good sleep hygiene. Avoid alcohol.   If you had Gastrointestinal Bleeding: Please ask your Primary MD to check a complete blood count within one week of discharge or at your next visit. Your endoscopic/colonoscopic biopsies that are pending at the time of discharge, will also need to followed by your Primary MD.  Get Medicines reviewed and adjusted. Please take all your medications with you for your next visit with  your Primary MD  Please request your Primary MD to go over all hospital tests and procedure/radiological results at the follow up, please ask your Primary MD to get all Hospital records sent to his/her office.  If you experience worsening of your admission symptoms, develop shortness of breath, life threatening emergency, suicidal or homicidal thoughts you must seek medical attention immediately by calling 911 or calling your MD immediately  if symptoms less severe.  You must read complete  instructions/literature along with all the possible adverse reactions/side effects for all the Medicines you take and that have been prescribed to you. Take any new Medicines after you have completely understood and accpet all the possible adverse reactions/side effects.   Do not drive or operate heavy machinery when taking Pain medications.   Do not take more than prescribed Pain, Sleep and Anxiety Medications  Special Instructions: If you have smoked or chewed Tobacco  in the last 2 yrs please stop smoking, stop any regular Alcohol  and or any Recreational drug use.  Wear Seat belts while driving.  Please note You were cared for by a hospitalist during your hospital stay. If you have any questions about your discharge medications or the care you received while you were in the hospital after you are discharged, you can call the unit and asked to speak with the hospitalist on call if the hospitalist that took care of you is not available. Once you are discharged, your primary care physician will handle any further medical issues. Please note that NO REFILLS for any discharge medications will be authorized once you are discharged, as it is imperative that you return to your primary care physician (or establish a relationship with a primary care physician if you do not have one) for your aftercare needs so that they can reassess your need for medications and monitor your lab values.  You can reach the hospitalist office at phone [phone] or fax [phone]   If you do not have a primary care physician, you can call [phone] for a physician referral.  Activity: As tolerated with Full fall precautions use walker/cane & assistance as needed    Diet: regular  Disposition Home    UTILITIES  [facility]  [address] [place]  [phone]  Rental assistance/rental hotline: ([phone] ext. 340.  Utility assistance/utility hotline: (707)722-9637 ext. [address] [employer] (heating/cooling and water assistance)  [phone] (rental and utility assistance)  [phone]   [employer] - call 211       Discharge Instructions     Ambulatory referral to Physical Therapy   Complete by: As directed         Allergies as of 04/22/2024   No Known Allergies      Medication List     TAKE these medications    acetaminophen  500 MG tablet Commonly known as: TYLENOL  Take 1,000 mg by mouth 2 (two) times daily as needed for fever or headache (pain).   dorzolamide -timolol  2-0.5 % ophthalmic solution Commonly known as: COSOPT  Place 1 drop into both eyes 2 (two) times daily.   fluticasone  50 MCG/ACT nasal spray Commonly known as: FLONASE  Place 2 sprays into both nostrils daily. What changed:  how much to take when to take this reasons to take this   lidocaine  5 % Commonly known as: LIDODERM  Place 2 patches onto the skin daily. Remove & Discard patch  within 12 hours or as directed by MD Start taking on: April 23, 2024   loratadine  10 MG tablet Commonly known as: CLARITIN  Take 1 tablet (10 mg total) by mouth daily. What changed:  when to take this reasons to take this   methocarbamol  500 MG tablet Commonly known as: ROBAXIN  Take 1 tablet (500 mg total) by mouth every 8 (eight) hours as needed for muscle spasms.   methylPREDNISolone  4 MG tablet Commonly known as: MEDROL  Take 1 tablet (4 mg total) by mouth 4 (four) times daily for 1 day, THEN 1 tablet (4 mg total) 3 (three) times daily for 1 day, THEN 1 tablet (4 mg total) in the morning and at bedtime for 1 day, THEN 1 tablet (4 mg total) every morning for 1 day. Start taking on: April 22, 2024   oxyCODONE  5 MG immediate release tablet Commonly known as: Oxy IR/ROXICODONE  Take 1-2 tablets (5-10 mg total) by mouth every 6 (six) hours as needed for moderate pain (pain score 4-6).    Ozempic  (0.25 or 0.5 MG/DOSE) 2 MG/3ML Sopn Generic drug: Semaglutide (0.25 or 0.5MG /DOS) Inject 0.5 mg into the skin once a week.   rosuvastatin  20 MG tablet Commonly known as: CRESTOR  Take 1 tablet (20 mg total) by mouth daily for cholesterol   Travatan  Z 0.004 % Soln ophthalmic solution Generic drug: Travoprost  (BAK Free) Place 1 drop into both eyes at bedtime.   Vitamin D  (Ergocalciferol ) 1.25 MG (50000 UNIT) Caps capsule Commonly known as: DRISDOL  Take 1 capsule (50,000 Units total) by mouth every 7 (seven) days. What changed: when to take this               Durable Medical Equipment  (From admission, onward)           Start     Ordered   04/22/24 1245  For home use only DME 3 n 1  Once        04/22/24 1244   04/22/24 1118  For home use only DME Walker rolling  Once       Question Answer Comment  Walker: With 5 Inch Wheels   Patient needs a walker to treat with the following condition Back pain      04/22/24 1117             Discharge Instructions/ Education: An After Visit Summary was printed and given to the patient. Discharge instructions given to patient/family with verbalized understanding. Discharge education completed with patient/family including: follow up instructions, medication list, discharge activities, and limitations if indicated.  Additional discharge instructions as indicated by discharging provider also reviewed.  Patient and family able to verbalize understanding, all questions fully answered. Patient instructed to return to Emergency Department, call 911, or call MD for any changes in condition.   Patient escorted via wheelchair to lobby and discharged home via private automobile.   "

## 2024-04-22 NOTE — Evaluation (Signed)
 Physical Therapy Evaluation Patient Details Name: Teresa Chang MRN: [id number] DOB: [birth year] Today's Date: 04/22/2024  History of Present Illness  Pt is a 54 year old female admitted with severe lower back pain. 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 PMH: anxiety, depression, HTN, DM2, chronic diastolic CHF.   Clinical Impression  Pt is a 54 y.o. female with above HPI resulting in the deficits listed below (see PT Problem List). Pt is typically independent without use of AD with mobility and ADLs. Pt performed bed mobility with MIN A and use of log roll technique for comfort, sit to stand transfers with CGA and ambulation ~178ft with supervision and use of RW. Pt performed stairs x3 as pt has steps to enter home, CGA and cues for sequencing provided. Biggest limitation currently increased pain levels. Recommend OPPT and RW upon d/c. Pt  will benefit from skilled PT to maximize functional mobility to increase independence.                Equipment Recommendations Rolling walker (2 wheels)  Recommendations for Other Services  OT consult    Functional Status Assessment Patient has had a recent decline in their functional status and demonstrates the ability to make significant improvements in function in a reasonable and predictable amount of time.     Precautions / Restrictions Precautions Precautions: Back;Other (comment) (for comfort) Precaution/Restrictions Comments: educated on spinal precautions for comfort Restrictions Weight Bearing Restrictions Per Provider Order: No      Mobility  Bed Mobility Overal bed mobility: Needs Assistance Bed Mobility: Supine to Sit     Supine to sit: Min assist, HOB elevated, Used rails     General bed mobility comments: edcuated on use of log roll techiqnue for bed  mobility/comfort. MIN A to bring trunk to upright. Pt reports she does have recliner at home- discussed could sleep in recliner if needed while having increased pain levels.    Transfers Overall transfer level: Needs assistance Equipment used: None Transfers: Sit to/from Stand Sit to Stand: Contact guard assist, From elevated surface           General transfer comment: STS x3 from EOB and toilet surface. Cues provided for figure 4 in seated position for donning underwear, pants. elevated bed height to simulate home environment.    Ambulation/Gait Ambulation/Gait assistance: Supervision Gait Distance (Feet): 120 Feet Assistive device: None, Rolling walker (2 wheels) Gait Pattern/deviations: Step-through pattern, Decreased stride length, Trunk flexed Gait velocity: decr     General Gait Details: ambulated to bathroom and back ~59ft without AD and then remaining distance with use of RW noted increased B knee flexion during ambulation and trunk flexion due to increased pain with upright posture. standing reat break following stair training due to spasm in back.  Stairs Stairs: Yes Stairs assistance: Contact guard assist Stair Management: No rails, One rail Left, Step to pattern, Forwards Number of Stairs: 3 General stair comments: 3 steps ascending with increased time an dno rails, use of single railing on L to descend stairs- disucssed use of cane for single UE support with stairs if having difficulty to provide support.  Wheelchair Mobility     Tilt Bed    Modified Rankin (Stroke Patients Only)       Balance Overall balance assessment: Mild deficits observed, not formally tested  Pertinent Vitals/Pain Pain Assessment Pain Assessment: 0-10 Pain Score: 7  Pain Location: lower back Pain Descriptors / Indicators: Aching, Grimacing, Discomfort, Spasm, Guarding Pain Intervention(s): Limited activity within  patient's tolerance, Monitored during session, Repositioned (RN to provide meds following session)    Home Living Family/patient expects to be discharged to:: Private residence Living Arrangements: Alone Available Help at Discharge: Friend(s);Family Type of Home: House Home Access: Stairs to enter Entrance Stairs-Rails: None Entrance Stairs-Number of Steps: 3   Home Layout: One level Home Equipment: None Additional Comments: significant other PRN works 12hrs/day. 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 Son lives away works 3rd shift. Friends live close by if need something.    Prior Function Prior Level of Function : Independent/Modified Independent;Driving;Working/employed             Mobility Comments: no falls, no AD at baseline.       Extremity/Trunk Assessment   Upper Extremity Assessment Upper Extremity Assessment: Defer to OT evaluation    Lower Extremity Assessment Lower Extremity Assessment: Generalized weakness    Cervical / Trunk Assessment Cervical / Trunk Assessment:  (trunk flexed due to pain)  Communication   Communication Communication: No apparent difficulties    Cognition Arousal: Alert Behavior During Therapy: WFL for tasks assessed/performed   PT - Cognitive impairments: No apparent impairments                         Following commands: Intact       Cueing       General Comments      Exercises     Assessment/Plan    PT Assessment Patient needs continued PT services  PT Problem List Decreased strength;Decreased range of motion;Decreased activity tolerance;Decreased balance;Decreased mobility;Pain       PT Treatment Interventions DME instruction;Gait training;Functional mobility training;Stair training;Therapeutic activities;Therapeutic exercise;Balance training;Patient/family education    PT Goals (Current goals can be found in the Care Plan section)  Acute Rehab PT Goals Patient Stated Goal:  return home and have less pain to get back to independence PT Goal Formulation: With patient Time For Goal Achievement: 05/06/24 Potential to Achieve Goals: Good    Frequency Min 3X/week     Co-evaluation               AM-PAC PT 6 Clicks Mobility  Outcome Measure Help needed turning from your back to your side while in a flat bed without using bedrails?: A Little Help needed moving from lying on your back to sitting on the side of a flat bed without using bedrails?: A Little Help needed moving to and from a bed to a chair (including a wheelchair)?: A Little Help needed standing up from a chair using your arms (e.g., wheelchair or bedside chair)?: A Little Help needed to walk in hospital room?: A Little Help needed climbing 3-5 steps with a railing? : A Little 6 Click Score: 18    End of Session Equipment Utilized During Treatment: Gait belt Activity Tolerance: Patient tolerated treatment well Patient left: in chair;with call bell/phone within reach;with chair alarm set Nurse Communication: Mobility status PT Visit Diagnosis: Pain;Muscle weakness (generalized) (M62.81);Difficulty in walking, not elsewhere classified (R26.2) Pain - Right/Left:  (bilateral) Pain - part of body:  (lower back)    Time: 8991-8960 PT Time Calculation (min) (ACUTE ONLY): 31 min   Charges:   PT Evaluation $PT Eval Low Complexity: 1 Low PT Treatments $Therapeutic Activity: 8-22 mins PT General  Charges $$ ACUTE PT VISIT: 1 Visit         Tinnie GRADE., PT, DPT  Acute Rehabilitation Services  Office [phone]  04/22/2024, 3:09 PM

## 2024-04-22 NOTE — Discharge Instructions (Addendum)
 Follow with Teresa Heron HERO, MD in 5-7 days  Please get a complete blood count and chemistry panel checked by your Primary MD at your next visit, and again as instructed by your Primary MD. Please get your medications reviewed and adjusted by your Primary MD.  Please request your Primary MD to go over all Hospital Tests and Procedure/Radiological results at the follow up, please get all Hospital records sent to your Prim MD by signing hospital release before you go home.  In some cases, there will be blood work, cultures and biopsy results pending at the time of your discharge. Please request that your primary care M.D. goes through all the records of your hospital data and follows up on these results.  If you had Pneumonia of Lung problems at the Hospital: Please get a 2 view Chest X ray done in 6-8 weeks after hospital discharge or sooner if instructed by your Primary MD.  If you have Congestive Heart Failure: Please call your Cardiologist or Primary MD anytime you have any of the following symptoms:  1) 3 pound weight gain in 24 hours or 5 pounds in 1 week  2) shortness of breath, with or without a dry hacking cough  3) swelling in the hands, feet or stomach  4) if you have to sleep on extra pillows at night in order to breathe  Follow cardiac low salt diet and 1.5 lit/day fluid restriction.  If you have diabetes Accuchecks 4 times/day, Once in AM empty stomach and then before each meal. Log in all results and show them to your primary doctor at your next visit. If any glucose reading is under 80 or above 300 call your primary MD immediately.  If you have Seizure/Convulsions/Epilepsy: Please do not drive, operate heavy machinery, participate in activities at heights or participate in high speed sports until you have seen by Primary MD or a Neurologist and advised to do so again. Per [place]  DMV statutes, patients with seizures are not allowed to drive until they have been  seizure-free for six months.  Use caution when using heavy equipment or power tools. Avoid working on ladders or at heights. Take showers instead of baths. Ensure the water temperature is not too high on the home water heater. Do not go swimming alone. Do not lock yourself in a room alone (i.e. bathroom). When caring for infants or small children, sit down when holding, feeding, or changing them to minimize risk of injury to the child in the event you have a seizure. Maintain good sleep hygiene. Avoid alcohol.   If you had Gastrointestinal Bleeding: Please ask your Primary MD to check a complete blood count within one week of discharge or at your next visit. Your endoscopic/colonoscopic biopsies that are pending at the time of discharge, will also need to followed by your Primary MD.  Get Medicines reviewed and adjusted. Please take all your medications with you for your next visit with your Primary MD  Please request your Primary MD to go over all hospital tests and procedure/radiological results at the follow up, please ask your Primary MD to get all Hospital records sent to his/her office.  If you experience worsening of your admission symptoms, develop shortness of breath, life threatening emergency, suicidal or homicidal thoughts you must seek medical attention immediately by calling 911 or calling your MD immediately  if symptoms less severe.  You must read complete instructions/literature along with all the possible adverse reactions/side effects for all the Medicines you  take and that have been prescribed to you. Take any new Medicines after you have completely understood and accpet all the possible adverse reactions/side effects.   Do not drive or operate heavy machinery when taking Pain medications.   Do not take more than prescribed Pain, Sleep and Anxiety Medications  Special Instructions: If you have smoked or chewed Tobacco  in the last 2 yrs please stop smoking, stop any regular  Alcohol  and or any Recreational drug use.  Wear Seat belts while driving.  Please note You were cared for by a hospitalist during your hospital stay. If you have any questions about your discharge medications or the care you received while you were in the hospital after you are discharged, you can call the unit and asked to speak with the hospitalist on call if the hospitalist that took care of you is not available. Once you are discharged, your primary care physician will handle any further medical issues. Please note that NO REFILLS for any discharge medications will be authorized once you are discharged, as it is imperative that you return to your primary care physician (or establish a relationship with a primary care physician if you do not have one) for your aftercare needs so that they can reassess your need for medications and monitor your lab values.  You can reach the hospitalist office at phone [phone] or fax [phone]   If you do not have a primary care physician, you can call [phone] for a physician referral.  Activity: As tolerated with Full fall precautions use walker/cane & assistance as needed    Diet: regular  Disposition Home    UTILITIES  [facility]  [address] [place]  [phone]  Rental assistance/rental hotline: 403-575-2640 ext. 340.  Utility assistance/utility hotline: (579)603-2540 ext. [address] [employer] Account [employer] (heating/cooling and water assistance)  [phone] (rental and utility assistance)  [phone]   [employer] - call 211

## 2024-04-22 NOTE — TOC Transition Note (Addendum)
 Transition of Care [facility]) - Discharge Note   Patient Details  Name: Teresa Chang MRN: [id number] Date of Birth: 1970-09-25  Transition of Care [facility]) CM/SW Contact:  Heather DELENA Saltness, LCSW Phone Number: 04/22/2024, 1:13 PM   Clinical Narrative:     ADDENDUM  CSW spoke with pt via phone call, per pt's request. Pt inquired about utility assistance. Pt states her gas at home is going to be turned off tomorrow. CSW advised of utility resources in [facility], placed list in AVS.  Pt discharging home today. CSW met with pt at bedside to discuss discharge planning and PT's follow up recommendations. CSW advised of PT/OT's recommendation for OPPT services upon discharge. Pt agreeable to OPPT, preferably at [facility]. CSW sent referral to pt's PCP for OPPT at [facility]. PT also recommending RW and BSC. Pt agreeable, CSW spoke with Jermaine at [facility] who confirmed RW will be delivered to bedside; [facility] to be delivered to home address. Pt reports family will transport her home upon discharge. No further TOC needs at this time.   Final next level of care: Home/Self Care Barriers to Discharge: Barriers Resolved   Patient Goals and CMS Choice Patient states their goals for this hospitalization and ongoing recovery are:: To return home   Choice offered to / list presented to : NA [place] ownership interest in [facility]provided to:: Parent NA    Discharge Placement Home  Patient to be transferred to facility by: Family Name of family member notified: Patient Patient and family notified of of transfer: 04/22/24  Discharge Plan and Services Additional resources added to the After Visit Summary for  Follow Up                DME Arranged: Walker rolling, Bedside commode DME Agency: [employer] Date DME Agency Contacted: 04/22/24 Time DME Agency Contacted: 1313 Representative spoke with at DME Agency: [place] HH Arranged: NA HH Agency: NA         Social Drivers of Health (SDOH) Interventions SDOH Screenings   Food Insecurity: No Food Insecurity (04/20/2024)  Housing: Low Risk (04/20/2024)  Transportation Needs: No Transportation Needs (04/20/2024)  Utilities: Not At Risk (04/20/2024)  Alcohol Screen: Low Risk (03/21/2023)  Depression (PHQ2-9): Low Risk (04/18/2023)  Financial Resource Strain: Low Risk (03/21/2023)  Physical Activity: Inactive (03/21/2023)  Social Connections: Moderately Integrated (03/21/2023)  Stress: No Stress Concern Present (03/21/2023)  Tobacco Use: Medium Risk (04/19/2024)  Health Literacy: Adequate Health Literacy (03/21/2023)     Readmission Risk Interventions     No data to display           Signed: Heather Saltness, MSW, LCSW Clinical Social Worker Inpatient Care Management 04/22/2024 1:17 PM

## 2024-04-22 NOTE — Plan of Care (Signed)

## 2024-04-23 ENCOUNTER — Telehealth: Payer: Self-pay | Admitting: Family Medicine

## 2024-04-23 ENCOUNTER — Other Ambulatory Visit (HOSPITAL_COMMUNITY): Payer: Self-pay

## 2024-04-23 ENCOUNTER — Telehealth: Payer: Self-pay

## 2024-04-23 NOTE — Transitions of Care (Post Inpatient/ED Visit) (Signed)
 "  04/23/2024  Name: Teresa Chang MRN: [id number] DOB: 06/17/1970  Today's TOC FU Call Status: Today's TOC FU Call Status:: Successful TOC FU Call Completed TOC FU Call Complete Date: 04/23/24  Patient's Name and Date of Birth confirmed. Name, DOB  Transition Care Management Follow-up Telephone Call Date of Discharge: 04/22/24 Discharge Facility: [facility]) Type of Discharge: Inpatient Admission Primary Inpatient Discharge Diagnosis:: spinal stenosis How have you been since you were released from the hospital?: Same Any questions or concerns?: No  Items Reviewed: Did you receive and understand the discharge instructions provided?: Yes Medications obtained,verified, and reconciled?: Yes (Medications Reviewed) Any new allergies since your discharge?: No Dietary orders reviewed?: Yes Do you have support at home?: No  Medications Reviewed Today: Medications Reviewed Today     Reviewed by Emmitt Pan, LPN (Licensed Practical Nurse) on 04/23/24 at 1510  Med List Status: <None>   Medication Order Taking? Sig Documenting Provider Last Dose Status Informant  acetaminophen  (TYLENOL ) 500 MG tablet [id number] Yes Take 1,000 mg by mouth 2 (two) times daily as needed for fever or headache (pain). [provider]  Active Self, Pharmacy Records  dorzolamide -timolol  (COSOPT ) 2-0.5 % ophthalmic solution [id number] Yes Place 1 drop into both eyes 2 (two) times daily.   Active Self, Pharmacy Records  fluticasone  (FLONASE ) 50 MCG/ACT nasal spray [id number] Yes Place 2 sprays into both nostrils daily.  Patient taking differently: Place 1-2 sprays into both nostrils daily as needed for allergies or rhinitis.   Dameron, Marisa, DO  Active Self, Pharmacy Records  lidocaine  (LIDODERM ) 5 % [id number] Yes Place 2 patches onto the skin daily. Remove & Discard patch within 12 hours or as directed by MD Trixie Nilda HERO, MD  Active   loratadine  (CLARITIN ) 10 MG tablet [id number] Yes Take 1  tablet (10 mg total) by mouth daily.  Patient taking differently: Take 10 mg by mouth daily as needed for allergies.   Dameron, Marisa, DO  Active Self, Pharmacy Records  methocarbamol  (ROBAXIN ) 500 MG tablet [id number] Yes Take 1 tablet (500 mg total) by mouth every 8 (eight) hours as needed for muscle spasms. Gherghe, Costin M, MD  Active   methylPREDNISolone  (MEDROL ) 4 MG tablet [id number] Yes Take 1 tablet (4 mg total) by mouth 4 (four) times daily for 1 day, THEN 1 tablet (4 mg total) 3 (three) times daily for 1 day, THEN 1 tablet (4 mg total) in the morning and at bedtime for 1 day, THEN 1 tablet (4 mg total) every morning for 1 day. Gherghe, Costin M, MD  Active   oxyCODONE  (OXY IR/ROXICODONE ) 5 MG immediate release tablet [id number] Yes Take 1-2 tablets (5-10 mg total) by mouth every 6 (six) hours as needed for moderate pain (pain score 4-6). Gherghe, Costin M, MD  Active   rosuvastatin  (CRESTOR ) 20 MG tablet [id number] Yes Take 1 tablet (20 mg total) by mouth daily for cholesterol Lonni Slain, MD  Active Self, Pharmacy Records  Semaglutide ,0.25 or 0.5MG /DOS, (OZEMPIC , 0.25 OR 0.5 MG/DOSE,) 2 MG/3ML SOPN [id number] Yes Inject 0.5 mg into the skin once a week. Ozell Heron HERO, MD  Active Self, Pharmacy Records           Med Note (COFFELL, JON HERO   Fri Apr 20, 2024  2:56 PM) Patient unable to recall which day of the weeks she injects on - confirmed last dose has been in the last 7 days.  Travoprost , BAK Free, (TRAVATAN  Z) 0.004 % SOLN ophthalmic solution [id number] Yes Place  1 drop into both eyes at bedtime.   Active Self, Pharmacy Records  Vitamin D , Ergocalciferol , (DRISDOL ) 1.25 MG (50000 UNIT) CAPS capsule [id number] Yes Take 1 capsule (50,000 Units total) by mouth every 7 (seven) days.  Patient taking differently: Take 50,000 Units by mouth every Friday.   Ozell Heron HERO, MD  Active Self, Pharmacy Records            Home Care and Equipment/Supplies: Were Home Health  Services Ordered?: NA Any new equipment or medical supplies ordered?: Yes Name of Medical supply agency?: unknown Were you able to get the equipment/medical supplies?: Yes Do you have any questions related to the use of the equipment/supplies?: No  Functional Questionnaire: Do you need assistance with bathing/showering or dressing?: No Do you need assistance with meal preparation?: No Do you need assistance with eating?: No Do you have difficulty maintaining continence: No Do you need assistance with getting out of bed/getting out of a chair/moving?: No Do you have difficulty managing or taking your medications?: No  Follow up appointments reviewed: PCP Follow-up appointment confirmed?: Yes Date of PCP follow-up appointment?: 04/24/24 Follow-up Provider: [facility] Follow-up appointment confirmed?: No Reason Specialist Follow-Up Not Confirmed: Patient has Specialist Provider Number and will Call for Appointment Do you need transportation to your follow-up appointment?: No Do you understand care options if your condition(s) worsen?: Yes-patient verbalized understanding    SIGNATURE Julian Lemmings, LPN [facility] Nurse Health Advisor Direct Dial [phone]  "

## 2024-04-23 NOTE — Telephone Encounter (Signed)
 Patient dropped off document Medical Certification for [employer], to be filled out by provider. Patient requested to send it back via Call Patient to pick up within 7-days. Document is located in providers tray at front office.Please advise at [facility] [phone]

## 2024-04-24 ENCOUNTER — Ambulatory Visit: Attending: Family Medicine | Admitting: Physical Therapy

## 2024-04-24 ENCOUNTER — Ambulatory Visit: Admitting: Family Medicine

## 2024-04-24 ENCOUNTER — Other Ambulatory Visit (HOSPITAL_COMMUNITY): Payer: Self-pay

## 2024-04-24 ENCOUNTER — Other Ambulatory Visit: Payer: Self-pay

## 2024-04-24 ENCOUNTER — Encounter: Payer: Self-pay | Admitting: Family Medicine

## 2024-04-24 ENCOUNTER — Encounter: Payer: Self-pay | Admitting: Physical Therapy

## 2024-04-24 VITALS — BP 148/100 | HR 70 | Temp 98.2°F | Ht 67.0 in | Wt 192.5 lb

## 2024-04-24 DIAGNOSIS — E119 Type 2 diabetes mellitus without complications: Secondary | ICD-10-CM | POA: Diagnosis not present

## 2024-04-24 DIAGNOSIS — M6281 Muscle weakness (generalized): Secondary | ICD-10-CM | POA: Diagnosis present

## 2024-04-24 DIAGNOSIS — R0609 Other forms of dyspnea: Secondary | ICD-10-CM | POA: Diagnosis not present

## 2024-04-24 DIAGNOSIS — R262 Difficulty in walking, not elsewhere classified: Secondary | ICD-10-CM | POA: Insufficient documentation

## 2024-04-24 DIAGNOSIS — M549 Dorsalgia, unspecified: Secondary | ICD-10-CM | POA: Diagnosis not present

## 2024-04-24 DIAGNOSIS — Z7985 Long-term (current) use of injectable non-insulin antidiabetic drugs: Secondary | ICD-10-CM | POA: Diagnosis not present

## 2024-04-24 DIAGNOSIS — M48061 Spinal stenosis, lumbar region without neurogenic claudication: Secondary | ICD-10-CM | POA: Insufficient documentation

## 2024-04-24 DIAGNOSIS — M5459 Other low back pain: Secondary | ICD-10-CM | POA: Diagnosis present

## 2024-04-24 DIAGNOSIS — R5383 Other fatigue: Secondary | ICD-10-CM | POA: Diagnosis not present

## 2024-04-24 MED ORDER — SEMAGLUTIDE (1 MG/DOSE) 4 MG/3ML ~~LOC~~ SOPN
1.0000 mg | PEN_INJECTOR | SUBCUTANEOUS | 5 refills | Status: AC
  Filled 2024-04-24 – 2024-05-10 (×2): qty 3, 28d supply, fill #0

## 2024-04-24 NOTE — Telephone Encounter (Signed)
 Signed and done in visit today, ok to close

## 2024-04-24 NOTE — Progress Notes (Unsigned)
 "  Established Patient Office Visit  Subjective   Patient ID: Teresa Chang, female    DOB: [birth year]  Age: 54 y.o. MRN: [id number]  Chief Complaint  Patient presents with   Hospitalization Follow-up    HPI Discussed the use of AI scribe software for clinical note transcription with the patient, who gave verbal consent to proceed.  History of Present Illness   Teresa Chang is a 54 year old female with chronic disc disease and arthritis who presents with severe back pain.  She developed sudden severe back pain, with MRI showing moderate spinal canal stenosis from a bulging disc. Pain is worse than prior episodes and limits dressing and cleaning. She recently started physical therapy with TENS and heat twice weekly.  She has long-standing disc disease and arthritis with intermittent pain that has not been this severe. She previously received a Medrol  Dosepak and muscle relaxers. She took pain medication Monday night but none since.  She is unable to manage household tasks and needs help with daily activities. Her daughter, who previously assisted, is now away at college, leaving her without home support, so she is exploring options for assistance.  Her A1c has risen from 6.8 to 7.0 since April while on semaglutide  0.5 mg. Her blood pressure has been elevated since the recent hospital visit, attributed to pain. She takes rosuvastatin  for cholesterol and plans to ask her cardiologist whether to continue it.       Current Outpatient Medications  Medication Instructions   acetaminophen  (TYLENOL ) 1,000 mg, 2 times daily PRN   dorzolamide -timolol  (COSOPT ) 2-0.5 % ophthalmic solution 1 drop, Both Eyes, 2 times daily   fluticasone  (FLONASE ) 50 MCG/ACT nasal spray 2 sprays, Each Nare, Daily   lidocaine  (LIDODERM ) 5 % 2 patches, Transdermal, Every 24 hours, Remove & Discard patch within 12 hours or as directed by MD   loratadine  (CLARITIN ) 10 mg, Oral, Daily   methocarbamol  (ROBAXIN ) 500  mg, Oral, Every 8 hours PRN   methylPREDNISolone  (MEDROL ) 4 MG tablet Take 1 tablet (4 mg total) by mouth 4 (four) times daily for 1 day, THEN 1 tablet (4 mg total) 3 (three) times daily for 1 day, THEN 1 tablet (4 mg total) in the morning and at bedtime for 1 day, THEN 1 tablet (4 mg total) every morning for 1 day.   oxyCODONE  (OXY IR/ROXICODONE ) 5-10 mg, Oral, Every 6 hours PRN   rosuvastatin  (CRESTOR ) 20 MG tablet Take 1 tablet (20 mg total) by mouth daily for cholesterol   Semaglutide  (1 MG/DOSE) 1 mg, Injection, Weekly   Travoprost , BAK Free, (TRAVATAN  Z) 0.004 % SOLN ophthalmic solution 1 drop, Both Eyes, Nightly   Vitamin D  (Ergocalciferol ) (DRISDOL ) 50,000 Units, Oral, Every 7 days    Patient Active Problem List   Diagnosis Date Noted   Intractable low back pain 04/20/2024   Overweight (BMI 25.0-29.9) 04/20/2024   Glaucoma 04/20/2024   Allergic rhinitis 02/18/2022   Swelling of left wrist 12/01/2021   Health maintenance examination 08/19/2021   Vitamin D  deficiency 03/09/2021   Hair loss 03/09/2021   Left carpal tunnel syndrome 12/22/2020   Encounter for hepatitis C screening test for low risk patient 12/22/2020   Microscopic hematuria 08/18/2020   Chronic diastolic heart failure (HCC) 06/20/2018   Mixed hyperlipidemia 06/02/2018   Type 2 diabetes mellitus without complication, without long-term current use of insulin  (HCC) 04/06/2018   Dyspnea on exertion 02/19/2011   Fatigue 02/19/2011   Benign essential hypertension 04/04/2007   OBESITY,  NOS 06/09/2006   OSTEOARTHRITIS, MULTI SITES 06/09/2006     Review of Systems  All other systems reviewed and are negative.     Objective:     BP (!) 148/100   Pulse 70   Temp 98.2 F (36.8 C) (Oral)   Ht 5' 7 (1.702 m)   Wt 192 lb 8 oz (87.3 kg)   LMP 04/19/2024 (Exact Date)   SpO2 98%   BMI 30.15 kg/m    Physical Exam Vitals reviewed.  Constitutional:      Appearance: Normal appearance. She is well-groomed and  normal weight.  Eyes:     Conjunctiva/sclera: Conjunctivae normal.  Neck:     Thyroid : No thyromegaly.  Cardiovascular:     Rate and Rhythm: Normal rate and regular rhythm.     Pulses: Normal pulses.     Heart sounds: S1 normal and S2 normal.  Pulmonary:     Effort: Pulmonary effort is normal.     Breath sounds: Normal breath sounds and air entry.  Abdominal:     General: Bowel sounds are normal.  Musculoskeletal:     Right lower leg: No edema.     Left lower leg: No edema.  Neurological:     Mental Status: She is alert and oriented to person, place, and time. Mental status is at baseline.     Gait: Gait is intact.  Psychiatric:        Mood and Affect: Mood and affect normal.        Speech: Speech normal.        Behavior: Behavior normal.        Judgment: Judgment normal.      No results found for any visits on 04/24/24.    The ASCVD Risk score (Arnett DK, et al., 2019) failed to calculate for the following reasons:   The valid total cholesterol range is 130 to 320 mg/dL    Assessment & Plan:  Spinal stenosis of lumbar region without neurogenic claudication -     Ambulatory referral to Spine Surgery  Type 2 diabetes mellitus without complication, without long-term current use of insulin  (HCC) -     Semaglutide  (1 MG/DOSE); Inject 1 mg as directed once a week.  Dispense: 3 mL; Refill: 5  Assessment and Plan    Lumbar spinal stenosis with chronic disc disease and arthritis Severe onset of back pain due to moderate spinal canal stenosis from bulging disc and chronic disc disease with arthritis. MRI confirmed diagnosis. Pain management includes Medrol  Dosepak, lidocaine  patches, and muscle relaxers. Physical therapy initiated with TENS and heat therapy. No surgical referral yet, but referral to spine specialist planned. Home health services discussed for assistance with daily activities, but limitations noted. Home health is not for custodial care like cleaning, but for  skilled services such as physical therapy and wound care. - Continue physical therapy twice a week. - Referred to spine specialist for further evaluation. - Use opioids sparingly and stretch out the 20 tablets provided. - Provided temporary handicap sticker for six months.  Type 2 diabetes mellitus A1c increased from 6.8% in April to 7.0%, indicating suboptimal glycemic control. Current treatment includes semaglutide  0.5 mg weekly. - Increased semaglutide  to 1 mg weekly. - Scheduled follow-up in three months to recheck A1c and perform annual blood work.       I personally spent a total of 30 minutes in the care of the patient today including preparing to see the patient, getting/reviewing separately obtained history, counseling and  educating, placing orders, documenting clinical information in the EHR, communicating results, and coordinating care.  Return in about 3 months (around 07/23/2024).    Heron CHRISTELLA Sharper, MD "

## 2024-04-24 NOTE — Therapy (Addendum)
 " OUTPATIENT PHYSICAL THERAPY THORACOLUMBAR EVALUATION/DISCHARGE SUMMARY   Patient Name: Teresa Chang MRN: [id number] DOB:1970/10/29, 54 y.o., female Today's Date: 04/24/2024  END OF SESSION:  PT End of Session - 04/24/24 1112     Visit Number 1    Date for Recertification  06/19/24    Authorization Type Medicaid [facility]    PT Start Time 1103    PT Stop Time 1145    PT Time Calculation (min) 42 min    Activity Tolerance Patient tolerated treatment well          Past Medical History:  Diagnosis Date   Anxiety    Bipolar disorder (HCC)    Depression    Glaucoma    Hypertension    Type 2 diabetes mellitus (HCC)    Past Surgical History:  Procedure Laterality Date   eye procedure     lense implant for glaucoma   TUBAL LIGATION     Patient Active Problem List   Diagnosis Date Noted   Intractable low back pain 04/20/2024   Overweight (BMI 25.0-29.9) 04/20/2024   Glaucoma 04/20/2024   Allergic rhinitis 02/18/2022   Swelling of left wrist 12/01/2021   Health maintenance examination 08/19/2021   Vitamin D  deficiency 03/09/2021   Hair loss 03/09/2021   Left carpal tunnel syndrome 12/22/2020   Encounter for hepatitis C screening test for low risk patient 12/22/2020   Microscopic hematuria 08/18/2020   Chronic diastolic heart failure (HCC) 06/20/2018   Mixed hyperlipidemia 06/02/2018   Type 2 diabetes mellitus without complication, without long-term current use of insulin  (HCC) 04/06/2018   Dyspnea on exertion 02/19/2011   Fatigue 02/19/2011   Benign essential hypertension 04/04/2007   OBESITY, NOS 06/09/2006   OSTEOARTHRITIS, MULTI SITES 06/09/2006    PCP: Ozell Railing MD  REFERRING PROVIDER: Ozell Railing MD  REFERRING DIAG: M54.9 intractable back pain, M48.061 spinal stenosis of lumbar region, R06.09 dsypnea on exertion, R53.83 fatigue  Rationale for Evaluation and Treatment: Rehabilitation  THERAPY DIAG:  Other low back pain  Muscle  weakness (generalized)  ONSET DATE: April 19, 2024; original injury 21 years ago  SUBJECTIVE:                                                                                                                                                                                           SUBJECTIVE STATEMENT: Original injury 21 years ago while working for the [employer]; a little over a week ago had to go the hospital (last Wednesday).  Diagnosed with sIipped disc and arthritis;  discharged from the hospital (Sunday) 2 days ago. Presents with RW.  I need help  at home, planning to discuss with Dr. Ozell this afternoon  PERTINENT HISTORY:  DM, anxiety, bipolar, HTN  PAIN:   Are you having pain? Yes NPRS scale: 6/10 Pain location: bil low back  Pain orientation: Bilateral  PAIN TYPE: aching Pain description: constant  Aggravating factors: getting off the bed, bending down to put on socks and shoes (uses a sock aid and reacher); going to the bathroom;  Relieving factors: lying down   PRECAUTIONS: None   WEIGHT BEARING RESTRICTIONS: No  FALLS:  Has patient fallen in last 6 months? 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 Ozell today  OBJECTIVE:  Note: Objective measures were completed at Evaluation unless otherwise noted.  DIAGNOSTIC FINDINGS:  04/20/2024 07:59:31 AM   TECHNIQUE: Multiplanar multisequence MRI of the lumbar spine was performed without the administration of intravenous contrast.   COMPARISON: Pelvis ultrasound 08/05/2023 and lumbar radiographs  07/31/2005.   CLINICAL HISTORY: 54 year old female. Low back pain, cauda equina syndrome suspected.   FINDINGS:   Normal lumbar segmentation on the comparison radiographs.   BONES AND ALIGNMENT: Mild chronic levoconvex lumbar scoliosis. Chronic straightening of lumbar lordosis. Chronic degenerative endplate spurring and marrow signal changes in the lower lumbar spine and at the lumbosacral junction. Maintained vertebral height. No marrow edema. Intact visible sacrum and SI joints. Background marrow signal is normal.   SPINAL CORD: Normal conus medullaris at T11-T12.   SOFT TISSUES: No paraspinal mass. Abnormally enlarged uterus partially visible in the pelvis and at the sacral promontory and appears related to multiple uterine masses which might be fibroids (for example series 6 image 4).     DEGENERATIVE: Visible lower thoracic levels through L2-L3 are negative for age.   L3-L4: Disc desiccation with mild circumferential disc bulge. Midline and slightly caudal disc extrusion (series 8 image 25 and series 5 image 10) is small to moderate. Mild posterior element hypertrophy. Mild spinal stenosis, and disc material in proximity to the descending L4 nerve roots in the bilateral lateral recesses. No significant foraminal involvement or stenosis.   L4-L5: Advanced disc space loss and disc signal changes. Circumferential disc osteophyte complex asymmetric to the right. Mild posterior element hypertrophy. Moderate right lateral recess stenosis open for descending right L5 nerve level (series 8 image 28). No spinal stenosis. No significant foraminal stenosis.   L5-S1: Similar advanced disc space loss. Bulky circumferential disc osteophyte complex asymmetric to the right. Moderate facet and ligament flavum hypertrophy. Epidural lipomatosis in large part effaces CSF from the thecal sac at this level. Mild lateral recess stenosis at the bilateral descending S1 nerve levels.  Moderate bilateral L5 foraminal stenosis.   IMPRESSION: 1. L3-L4 central small to moderate disc extrusion with mild spinal stenosis and lateral recess involvement (descending L4 nerves). 2. More advanced chronic disc and endplate degeneration at L4 L5 and L5-S1 , with moderate L5 nerve level stenosis. 3. Enlarged uterus with multiple masses, probably corresponding to chronic fibroids (series 6 image 4).  PATIENT SURVEYS:  Modified Oswestry:  MODIFIED OSWESTRY DISABILITY SCALE  Date: 1/13 Score  Pain intensity 4 =  Pain  medication provides me with little relief from pain.  2. Personal care (washing, dressing, etc.) 5 =  I do not get dressed, wash with difficulty, and stay in bed.  3. Lifting 5 =  I cannot lift or carry anything at all.  4. Walking 4 = I can only walk with crutches or a cane.  5. Sitting 4 =  Pain prevents me from sitting more than 10 minutes.  6. Standing 4 =  Pain prevents me from standing more than 10 minutes.  7. Sleeping 4 =  Even when I take pain medication, I sleep less than 2 hour  8. Social Life 5 =  I have hardly any social life because of my pain.  9. Traveling 5 = My pain prevents all travel except for visits to the physician/therapist or hospital  10. Employment/ Homemaking 5 = Pain prevents me from performing any job or homemaking chores.  Total 90%   Interpretation of scores: Score Category Description  0-20% Minimal Disability The patient can cope with most living activities. Usually no treatment is indicated apart from advice on lifting, sitting and exercise  21-40% Moderate Disability The patient experiences more pain and difficulty with sitting, lifting and standing. Travel and social life are more difficult and they may be disabled from work. Personal care, sexual activity and sleeping are not grossly affected, and the patient can usually be managed by conservative means  41-60% Severe Disability Pain remains the main problem in this group, but  activities of daily living are affected. These patients require a detailed investigation  61-80% Crippled Back pain impinges on all aspects of the patients life. Positive intervention is required  81-100% Bed-bound These patients are either bed-bound or exaggerating their symptoms  Bluford FORBES Zoe DELENA Karon DELENA, et al. Surgery versus conservative management of stable thoracolumbar fracture: the PRESTO feasibility RCT. [place] ([place]): [employer]; 2021 Nov. [facility] Technology Assessment, No. 25.62.) Appendix 3, Oswestry Disability Index category descriptors. Available from: Findjewelers.cz  Minimally Clinically Important Difference (MCID) = 12.8%  COGNITION: Overall cognitive status: Within functional limits for tasks assessed      LUMBAR ROM:  pt unable to perform lumbar movements secondary to pain severity  AROM eval  Flexion Unable to tolerate testing  Extension   Right lateral flexion   Left lateral flexion   Right rotation   Left rotation    (Blank rows = not tested)  TRUNK STRENGTH:  Decreased activation of transverse abdominus muscles; abdominals 4-/5; decreased activation of lumbar multifidi; trunk extensors 4-/5   LOWER EXTREMITY ROM:   very painful with seated knee extension, unable to do hip flexion, ankle ROM not painful  LOWER EXTREMITY MMT:  quads 3/5 (needs heavy UE assist or higher seat surface to rise)   LUMBAR SPECIAL TESTS:  + left slump test  FUNCTIONAL TESTS:  Heavy use of Ues to rise from chair (takes several attempts)  GAIT:  Comments: antalgic, slow gait with RW; pt has to stop several times secondary to pain when walking from waiting room to the other end of the clinic 100 feet  TREATMENT DATE: 04/24/24 evaluation        Sidelying IFC bil lumbar musculature 8.2 ma 10 min with heat for pain modulation Pt complains of RW drag therefore put 2 tennis balls on back legs  PATIENT EDUCATION:  Education details: Educated patient on anatomy and physiology of current symptoms, prognosis, plan of care as well as initial self care strategies to promote recovery Person educated: Patient Education method: Explanation Education comprehension: verbalized understanding  HOME EXERCISE PROGRAM: To be started  ASSESSMENT:  CLINICAL IMPRESSION: Patient is a 54 y.o. female who was seen today for physical therapy evaluation and treatment for intractable back pain requiring recent hospitalization. The patient's mobility is very restricted and she is currently using a RW and lives alone.  Modified Oswestry Index score is 90% indicating a bedbound status.  would benefit from PT to address trunk and hip range of motion deficits, strength asymmetries in lumbo/pelvic and hip regions and pain levels that are currently affecting activities of daily living at home and work including sitting, standing, sleeping, walking, lifting and performing hobbies and recreational activities.      OBJECTIVE IMPAIRMENTS: decreased activity tolerance, decreased mobility, difficulty walking, decreased ROM, decreased strength, impaired perceived functional ability, and pain.   ACTIVITY LIMITATIONS: carrying, lifting, bending, sitting, standing, squatting, sleeping, stairs, transfers, bed mobility, bathing, toileting, dressing, hygiene/grooming, locomotion level, and caring for others  PARTICIPATION LIMITATIONS: meal prep, cleaning, laundry, driving, shopping, community activity, occupation, and church  PERSONAL FACTORS: Time since onset of injury/illness/exacerbation and 3+ comorbidities: Diabetes, HTN, bipolar/anxiety are also affecting patient's functional outcome.   REHAB POTENTIAL: Good  CLINICAL DECISION MAKING: Evolving/moderate complexity  EVALUATION COMPLEXITY: Moderate   GOALS: Goals reviewed with patient?  Yes  SHORT TERM GOALS: Target date: 05/22/2024    The patient will demonstrate knowledge of basic self care strategies and exercises to promote healing  Baseline: Goal status: INITIAL  2.  The patient will report a 30% improvement in pain levels with functional activities which are currently difficult including getting off the bed, bending down to put on socks and shoes, going to the bathroom Baseline:  Goal status: INITIAL  3.  The patient will be able to walk household distances without an assistive device Baseline:  Goal status: INITIAL  4.  Improved LE and trunk strength needed to rise from a standard height chair without UE assist Baseline:  Goal status: INITIAL  5.  The patient will have lumbar and hip mobility to be able to pick up a small object from the floor without her reacher Baseline:  Goal status: INITIAL    LONG TERM GOALS: Target date: 06/19/2024   The patient will be independent in a safe self progression of a home exercise program to promote further recovery of function  Baseline:  Goal status: INITIAL  2.  The patient will report a 60% improvement in pain levels with functional activities which are currently difficult including getting off the bed, bending down to put on socks and shoes, going to the bathroom, sitting Baseline:  Goal status: INITIAL  3.  The patient will have improved gait stamina and speed needed to ambulate 600 feet in 6 minutes Baseline:  Goal status: INITIAL  4.  The patient will have improved trunk flexor and extensor muscle strength to at least 4+/5 needed for lifting light to medium weight objects such as grocery bags and laundry   Baseline:  Goal status: INITIAL  5.  Modified Oswestry functional outcome measure score improved to    65 % indicating improved function with ADLS with less pain.   Baseline:  Goal status: INITIAL    PLAN:  PT FREQUENCY: 2x/week  PT DURATION: 8 weeks  PLANNED INTERVENTIONS: 02835- PT  Re-evaluation, 97750-  Physical Performance Testing, 97110-Therapeutic exercises, 97530- Therapeutic activity, [id number]- Neuromuscular re-education, 97535- Self Care, 02859- Manual therapy, [id number]- Aquatic Therapy, [phone]- Electrical stimulation (unattended), [phone]- Electrical stimulation (manual), [id number]- Ultrasound, [id number]- Traction (mechanical), [id number]- Ionotophoresis 4mg /ml Dexamethasone, 79439 (1-2 muscles), 20561 (3+ muscles)- Dry Needling, Patient/Family education, Joint mobilization, Spinal manipulation, Spinal mobilization, Cryotherapy, and Moist heat.  PLAN FOR NEXT SESSION: may need heat/ES for pain control with low level lumbo/pelvic/hip mobility ex's (supine or sidelying positions to start)  Glade Pesa, PT 04/24/2024 6:41 PM Phone: [phone] Fax: [phone]  PHYSICAL THERAPY DISCHARGE SUMMARY  Visits from Start of Care: 1  Current functional level related to goals / functional outcomes: She called in 1/21 stating that she is feeling better and would like to be discharged from POC    Remaining deficits: As above   Education / Equipment: As above   Patient agrees to discharge. Patient goals were not met. Patient is being discharged due to not returning since the last visit.  Glade Pesa, PT 05/04/24 7:47 AM Phone: [phone] Fax: [phone]       "

## 2024-04-26 ENCOUNTER — Ambulatory Visit: Admitting: Physical Therapy

## 2024-04-30 ENCOUNTER — Ambulatory Visit

## 2024-05-01 ENCOUNTER — Telehealth: Payer: Self-pay

## 2024-05-01 NOTE — Telephone Encounter (Signed)
 Called pt on 04/30/2024 due to missed appt but forgot to put in telephone note at that time. Reminded pt about No show policy and importance of calling to cancel appt 24 hrs in advance if she is not able to come for any reason. Reminded her about her appt on 05/03/2024 at 3:30.

## 2024-05-03 ENCOUNTER — Ambulatory Visit: Admitting: Physical Therapy

## 2024-05-03 ENCOUNTER — Other Ambulatory Visit (HOSPITAL_COMMUNITY): Payer: Self-pay

## 2024-05-07 ENCOUNTER — Ambulatory Visit

## 2024-05-10 ENCOUNTER — Other Ambulatory Visit: Payer: Self-pay

## 2024-05-10 ENCOUNTER — Other Ambulatory Visit (HOSPITAL_COMMUNITY): Payer: Self-pay

## 2024-05-10 ENCOUNTER — Ambulatory Visit: Admitting: Physical Therapy

## 2024-05-10 MED ORDER — TRAVOPROST (BAK FREE) 0.004 % OP SOLN
1.0000 [drp] | Freq: Every evening | OPHTHALMIC | 1 refills | Status: AC
  Filled 2024-05-10: qty 2.5, 25d supply, fill #0

## 2024-05-11 ENCOUNTER — Other Ambulatory Visit (HOSPITAL_COMMUNITY): Payer: Self-pay

## 2024-05-15 ENCOUNTER — Ambulatory Visit: Admitting: Physical Therapy

## 2024-05-17 ENCOUNTER — Ambulatory Visit

## 2024-07-23 ENCOUNTER — Ambulatory Visit: Admitting: Family Medicine
